# Patient Record
Sex: Male | Born: 1965 | Race: White | Hispanic: No | Marital: Single | Smoking: Current every day smoker
Health system: Southern US, Community
[De-identification: ages and names within clinical notes are randomized; demographics above are authoritative.]

## PROBLEM LIST (undated history)

## (undated) DIAGNOSIS — G8929 Other chronic pain: Secondary | ICD-10-CM

## (undated) DIAGNOSIS — A4902 Methicillin resistant Staphylococcus aureus infection, unspecified site: Secondary | ICD-10-CM

## (undated) DIAGNOSIS — L02212 Cutaneous abscess of back [any part, except buttock]: Secondary | ICD-10-CM

## (undated) DIAGNOSIS — M545 Low back pain: Secondary | ICD-10-CM

## (undated) DIAGNOSIS — I10 Essential (primary) hypertension: Secondary | ICD-10-CM

## (undated) DIAGNOSIS — Z720 Tobacco use: Secondary | ICD-10-CM

## (undated) DIAGNOSIS — J449 Chronic obstructive pulmonary disease, unspecified: Secondary | ICD-10-CM

## (undated) DIAGNOSIS — J9601 Acute respiratory failure with hypoxia: Secondary | ICD-10-CM

## (undated) DIAGNOSIS — E119 Type 2 diabetes mellitus without complications: Secondary | ICD-10-CM

## (undated) DIAGNOSIS — M549 Dorsalgia, unspecified: Secondary | ICD-10-CM

## (undated) DIAGNOSIS — R400 Somnolence: Secondary | ICD-10-CM

## (undated) DIAGNOSIS — J690 Pneumonitis due to inhalation of food and vomit: Secondary | ICD-10-CM

## (undated) DIAGNOSIS — M109 Gout, unspecified: Secondary | ICD-10-CM

## (undated) HISTORY — DX: Pneumonitis due to inhalation of food and vomit: J69.0

## (undated) HISTORY — PX: TONSILLECTOMY: SUR1361

## (undated) HISTORY — DX: Acute respiratory failure with hypoxia: J96.01

## (undated) HISTORY — PX: CARDIAC CATHETERIZATION: SHX172

## (undated) HISTORY — DX: Essential (primary) hypertension: I10

## (undated) HISTORY — DX: Somnolence: R40.0

## (undated) HISTORY — DX: Low back pain: M54.5

## (undated) HISTORY — DX: Cutaneous abscess of back (any part, except buttock): L02.212

---
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
## 1997-10-15 ENCOUNTER — Emergency Department (HOSPITAL_COMMUNITY): Admission: EM | Admit: 1997-10-15 | Discharge: 1997-10-15 | Payer: Self-pay | Admitting: Emergency Medicine

## 2000-11-24 ENCOUNTER — Emergency Department (HOSPITAL_COMMUNITY): Admission: EM | Admit: 2000-11-24 | Discharge: 2000-11-24 | Payer: Self-pay | Admitting: Emergency Medicine

## 2003-12-20 ENCOUNTER — Emergency Department (HOSPITAL_COMMUNITY): Admission: EM | Admit: 2003-12-20 | Discharge: 2003-12-20 | Payer: Self-pay | Admitting: Emergency Medicine

## 2005-01-01 ENCOUNTER — Emergency Department (HOSPITAL_COMMUNITY): Admission: EM | Admit: 2005-01-01 | Discharge: 2005-01-01 | Payer: Self-pay | Admitting: Emergency Medicine

## 2008-10-10 ENCOUNTER — Emergency Department (HOSPITAL_COMMUNITY): Admission: EM | Admit: 2008-10-10 | Discharge: 2008-10-10 | Payer: Self-pay | Admitting: Emergency Medicine

## 2010-01-18 ENCOUNTER — Emergency Department (HOSPITAL_COMMUNITY): Admission: EM | Admit: 2010-01-18 | Discharge: 2010-01-18 | Payer: Self-pay | Admitting: Emergency Medicine

## 2010-01-30 ENCOUNTER — Emergency Department (HOSPITAL_COMMUNITY): Admission: EM | Admit: 2010-01-30 | Discharge: 2010-01-30 | Payer: Self-pay | Admitting: Emergency Medicine

## 2011-07-28 DIAGNOSIS — M545 Low back pain, unspecified: Secondary | ICD-10-CM

## 2011-07-28 HISTORY — DX: Low back pain, unspecified: M54.50

## 2015-01-16 ENCOUNTER — Encounter (HOSPITAL_BASED_OUTPATIENT_CLINIC_OR_DEPARTMENT_OTHER): Payer: Self-pay | Admitting: *Deleted

## 2015-01-16 ENCOUNTER — Emergency Department (HOSPITAL_BASED_OUTPATIENT_CLINIC_OR_DEPARTMENT_OTHER)
Admission: EM | Admit: 2015-01-16 | Discharge: 2015-01-16 | Disposition: A | Discharge status: Home / Self Care | Attending: Emergency Medicine | Admitting: Emergency Medicine

## 2015-01-16 DIAGNOSIS — S61211A Laceration without foreign body of left index finger without damage to nail, initial encounter: Secondary | ICD-10-CM

## 2015-01-16 DIAGNOSIS — Z79899 Other long term (current) drug therapy: Secondary | ICD-10-CM | POA: Insufficient documentation

## 2015-01-16 DIAGNOSIS — G8929 Other chronic pain: Secondary | ICD-10-CM | POA: Insufficient documentation

## 2015-01-16 DIAGNOSIS — Z720 Tobacco use: Secondary | ICD-10-CM | POA: Insufficient documentation

## 2015-01-16 DIAGNOSIS — Y998 Other external cause status: Secondary | ICD-10-CM | POA: Insufficient documentation

## 2015-01-16 DIAGNOSIS — W260XXA Contact with knife, initial encounter: Secondary | ICD-10-CM | POA: Insufficient documentation

## 2015-01-16 DIAGNOSIS — Y9389 Activity, other specified: Secondary | ICD-10-CM | POA: Insufficient documentation

## 2015-01-16 DIAGNOSIS — Y9289 Other specified places as the place of occurrence of the external cause: Secondary | ICD-10-CM | POA: Insufficient documentation

## 2015-01-16 DIAGNOSIS — Z23 Encounter for immunization: Secondary | ICD-10-CM | POA: Insufficient documentation

## 2015-01-16 HISTORY — DX: Other chronic pain: G89.29

## 2015-01-16 HISTORY — DX: Dorsalgia, unspecified: M54.9

## 2015-01-16 MED ORDER — HYDROCODONE-ACETAMINOPHEN 5-325 MG PO TABS
2.0000 | ORAL_TABLET | Freq: Once | ORAL | Status: AC
  Administered 2015-01-16: 2 via ORAL
  Filled 2015-01-16: qty 2

## 2015-01-16 MED ORDER — TETANUS-DIPHTH-ACELL PERTUSSIS 5-2.5-18.5 LF-MCG/0.5 IM SUSP
0.5000 mL | Freq: Once | INTRAMUSCULAR | Status: AC
  Administered 2015-01-16: 0.5 mL via INTRAMUSCULAR
  Filled 2015-01-16: qty 0.5

## 2015-01-16 MED ORDER — LIDOCAINE HCL (PF) 2 % IJ SOLN
INTRAMUSCULAR | Status: AC
  Administered 2015-01-16: 5 mL
  Filled 2015-01-16: qty 2

## 2015-01-16 MED ORDER — HYDROCODONE-ACETAMINOPHEN 5-325 MG PO TABS: 2.0000 | ORAL_TABLET | ORAL | Status: DC | PRN

## 2015-01-16 MED ORDER — LIDOCAINE HCL 2 % IJ SOLN: 5.0000 mL | Freq: Once | INTRAMUSCULAR | Status: AC

## 2015-01-16 NOTE — Discharge Instructions (Signed)

## 2015-01-16 NOTE — ED Notes (Signed)
Dressing applied by Iona Beard EMT, site wnl

## 2015-01-16 NOTE — ED Notes (Signed)
Pt from [facility]- was cutting apple and cut left middle finger with knife- bandage not removed in triageOceanographer with pt

## 2015-01-16 NOTE — ED Provider Notes (Signed)
CSN: [id number]     Arrival date & time 01/16/15  1338 History   First MD Initiated Contact with Patient 01/16/15 1440     Chief Complaint  Patient presents with  . Extremity Laceration     (Consider location/radiation/quality/duration/timing/severity/associated sxs/prior Treatment) Patient is a 49 y.o. male presenting with skin laceration. The history is provided by the patient. No language interpreter was used.  Laceration Location:  Finger Finger laceration location:  L index finger Length (cm):  2 Depth:  Through underlying tissue Quality: straight   Bleeding: controlled   Time since incident:  1 hour Laceration mechanism:  Knife Pain details:    Quality:  Aching   Severity:  Moderate   Progression:  Worsening Foreign body present:  No foreign bodies Relieved by:  Nothing Worsened by:  Nothing tried Ineffective treatments:  None tried Pt ask for multiple medications, shots for pain, oxycontin, oxycodone, ativan.   Past Medical History  Diagnosis Date  . Chronic back pain    Past Surgical History  Procedure Laterality Date  . Back surgery     No family history on file. History  Substance Use Topics  . Smoking status: Current Every Day Smoker  . Smokeless tobacco: Never Used  . Alcohol Use: No    Review of Systems  Skin: Positive for wound.  All other systems reviewed and are negative.     Allergies  Review of patient's allergies indicates no known allergies.  Home Medications   Prior to Admission medications   Medication Sig Start Date End Date Taking? Authorizing Provider  LORazepam (ATIVAN) 1 MG tablet Take 1 mg by mouth every 8 (eight) hours.   Yes Historical Provider, MD  Oxycodone HCl 10 MG TABS Take 10 mg by mouth 4 (four) times daily.   Yes Historical Provider, MD  HYDROcodone-acetaminophen (NORCO/VICODIN) 5-325 MG per tablet Take 2 tablets by mouth every 4 (four) hours as needed. 01/16/15   Hollace Kinnier Sofia, PA-C   BP 138/97 mmHg  Pulse 70   Temp(Src) 98.6 F (37 C) (Oral)  Resp 18  Ht 5\' 6"  (1.676 m)  Wt 190 lb (86.183 kg)  BMI 30.68 kg/m2  SpO2 97% Physical Exam  Constitutional: He is oriented to person, place, and time. He appears well-developed and well-nourished.  HENT:  Head: Normocephalic.  Eyes: EOM are normal.  Neck: Normal range of motion.  Pulmonary/Chest: Effort normal.  Musculoskeletal: He exhibits tenderness.  2cm laceration finger from  nv and ns intact  Neurological: He is alert and oriented to person, place, and time.  Psychiatric: He has a normal mood and affect.  Nursing note and vitals reviewed.   ED Course  LACERATION REPAIR Date/Time: 01/16/2015 3:17 PM Performed by: Fransico Meadow Authorized by: Fransico Meadow Consent: Verbal consent obtained. Patient identity confirmed: verbally with patient Body area: upper extremity Laceration length: 2 cm Foreign bodies: no foreign bodies Tendon involvement: none Nerve involvement: none Anesthesia: local infiltration Local anesthetic: lidocaine 2% without epinephrine Preparation: Patient was prepped and draped in the usual sterile fashion. Irrigation solution: saline Debridement: moderate Skin closure: 5-0 Prolene Number of sutures: 4 Technique: simple Approximation: loose Approximation difficulty: simple Patient tolerance: Patient tolerated the procedure well with no immediate complications   (including critical care time) Labs Review Labs Reviewed - No data to display  Imaging Review No results found.   EKG Interpretation None      MDM   Final diagnoses:  Laceration of left index finger w/o foreign body  w/o damage to nail, initial encounter    Suture removal in 8 days Hydrocodone 10 tablets.    Hollace Kinnier [place], PA-C 01/16/15 1519  Dorie Rank, MD 01/16/15 [phone]

## 2015-10-30 DIAGNOSIS — R400 Somnolence: Secondary | ICD-10-CM

## 2015-10-30 DIAGNOSIS — J9601 Acute respiratory failure with hypoxia: Secondary | ICD-10-CM

## 2015-10-30 HISTORY — DX: Acute respiratory failure with hypoxia: J96.01

## 2015-10-30 HISTORY — DX: Somnolence: R40.0

## 2015-10-31 DIAGNOSIS — J690 Pneumonitis due to inhalation of food and vomit: Secondary | ICD-10-CM | POA: Insufficient documentation

## 2015-10-31 HISTORY — DX: Pneumonitis due to inhalation of food and vomit: J69.0

## 2016-03-12 ENCOUNTER — Emergency Department
Admission: EM | Admit: 2016-03-12 | Discharge: 2016-03-12 | Disposition: A | Discharge status: Home / Self Care | Payer: Self-pay | Attending: Emergency Medicine | Admitting: Emergency Medicine

## 2016-03-12 ENCOUNTER — Encounter: Payer: Self-pay | Admitting: Emergency Medicine

## 2016-03-12 ENCOUNTER — Emergency Department: Payer: Self-pay

## 2016-03-12 DIAGNOSIS — G8929 Other chronic pain: Secondary | ICD-10-CM | POA: Insufficient documentation

## 2016-03-12 DIAGNOSIS — F17200 Nicotine dependence, unspecified, uncomplicated: Secondary | ICD-10-CM | POA: Insufficient documentation

## 2016-03-12 DIAGNOSIS — M549 Dorsalgia, unspecified: Secondary | ICD-10-CM | POA: Insufficient documentation

## 2016-03-12 DIAGNOSIS — L03115 Cellulitis of right lower limb: Secondary | ICD-10-CM | POA: Insufficient documentation

## 2016-03-12 LAB — CBC WITH DIFFERENTIAL/PLATELET
BASOS ABS: 0.1 10*3/uL (ref 0–0.1)
Basophils Relative: 1 %
Eosinophils Absolute: 0.1 10*3/uL (ref 0–0.7)
Eosinophils Relative: 1 %
HEMATOCRIT: 47.4 % (ref 40.0–52.0)
HEMOGLOBIN: 16.4 g/dL (ref 13.0–18.0)
LYMPHS PCT: 27 %
Lymphs Abs: 3.3 10*3/uL (ref 1.0–3.6)
MCH: 32 pg (ref 26.0–34.0)
MCHC: 34.5 g/dL (ref 32.0–36.0)
MCV: 92.8 fL (ref 80.0–100.0)
MONO ABS: 0.7 10*3/uL (ref 0.2–1.0)
MONOS PCT: 6 %
NEUTROS ABS: 8.3 10*3/uL — AB (ref 1.4–6.5)
Neutrophils Relative %: 67 %
Platelets: 259 10*3/uL (ref 150–440)
RBC: 5.11 MIL/uL (ref 4.40–5.90)
RDW: 14.3 % (ref 11.5–14.5)
WBC: 12.5 10*3/uL — ABNORMAL HIGH (ref 3.8–10.6)

## 2016-03-12 LAB — BASIC METABOLIC PANEL
ANION GAP: 8 (ref 5–15)
BUN: 12 mg/dL (ref 6–20)
CALCIUM: 9.1 mg/dL (ref 8.9–10.3)
CHLORIDE: 104 mmol/L (ref 101–111)
CO2: 24 mmol/L (ref 22–32)
CREATININE: 0.72 mg/dL (ref 0.61–1.24)
GFR calc Af Amer: >60 mL/min (ref >60)
GLUCOSE: 141 mg/dL — AB (ref 65–99)
Potassium: 4 mmol/L (ref 3.5–5.1)
Sodium: 136 mmol/L (ref 135–145)

## 2016-03-12 MED ORDER — CLINDAMYCIN PHOSPHATE 600 MG/50ML IV SOLN
600.0000 mg | Freq: Once | INTRAVENOUS | Status: AC
  Administered 2016-03-12: 600 mg via INTRAVENOUS
  Filled 2016-03-12: qty 50

## 2016-03-12 MED ORDER — HYDROCODONE-ACETAMINOPHEN 5-325 MG PO TABS: 1.0000 | ORAL_TABLET | ORAL | 0 refills | Status: DC | PRN

## 2016-03-12 MED ORDER — CLINDAMYCIN HCL 150 MG PO CAPS: ORAL_CAPSULE | ORAL | 0 refills | Status: DC

## 2016-03-12 MED ORDER — HYDROMORPHONE HCL 1 MG/ML IJ SOLN
1.0000 mg | Freq: Once | INTRAMUSCULAR | Status: AC
  Administered 2016-03-12: 1 mg via INTRAVENOUS
  Filled 2016-03-12: qty 1

## 2016-03-12 NOTE — ED Provider Notes (Signed)
[facility] Emergency Department Provider Note   ____________________________________________   First MD Initiated Contact with Patient 03/12/16 1307     (approximate)  I have reviewed the triage vital signs and the nursing notes.   HISTORY  Chief Complaint Abscess   HPI Jeremy Long is a 50 y.o. male is here with complaint of right knee pain.Patient states that he began having a tender area approximate 5 days ago that has continued to become red and tender. Wife states that [facility] yesterday and this morning his knee is much worse. He now has some red areas extending towards his thigh. Patient states that he squeezed an area on his knee this morning and pus came out. Patient denies any fever or chills, no nausea or vomiting. Patient rates his pain as a 10 over 10 at this time.   Past Medical History:  Diagnosis Date  . Chronic back pain     There are no active problems to display for this patient.   Past Surgical History:  Procedure Laterality Date  . BACK SURGERY      Prior to Admission medications   Medication Sig Start Date End Date Taking? Authorizing Provider  clindamycin (CLEOCIN) 150 MG capsule Take 2 capsule qid x 7 days 03/12/16   Johnn Hai, PA-C  HYDROcodone-acetaminophen (NORCO/VICODIN) 5-325 MG tablet Take 1 tablet by mouth every 4 (four) hours as needed for moderate pain. 03/12/16   Johnn Hai, PA-C  LORazepam (ATIVAN) 1 MG tablet Take 1 mg by mouth every 8 (eight) hours.    Historical Provider, MD  Oxycodone HCl 10 MG TABS Take 10 mg by mouth 4 (four) times daily.    Historical Provider, MD    Allergies Review of patient's allergies indicates no known allergies.  No family history on file.  Social History Social History  Substance Use Topics  . Smoking status: Current Every Day Smoker  . Smokeless tobacco: Never Used  . Alcohol use No    Review of Systems Constitutional: No  fever/chills Cardiovascular: Denies chest pain. Respiratory: Denies shortness of breath. Gastrointestinal: No abdominal pain.  No nausea, no vomiting.   Musculoskeletal: Positive chronic back pain. Skin: Positive infection right knee. Neurological: Negative for headaches, focal weakness or numbness.  10-point ROS otherwise negative.  ____________________________________________   PHYSICAL EXAM:  VITAL SIGNS: ED Triage Vitals  Enc Vitals Group     BP 03/12/16 1217 132/85     Pulse Rate 03/12/16 1217 88     Resp 03/12/16 1217 16     Temp 03/12/16 1217 98.2 F (36.8 C)     Temp Source 03/12/16 1217 Oral     SpO2 03/12/16 1217 98 %     Weight 03/12/16 1218 190 lb (86.2 kg)     Height 03/12/16 1218 5\' 7"  (1.702 m)     Head Circumference --      Peak Flow --      Pain Score 03/12/16 1218 10     Pain Loc --      Pain Edu? --      Excl. in [place]? --     Constitutional: Alert and oriented. Well appearing and in no acute distress. Eyes: Conjunctivae are normal. PERRL. EOMI. Head: Atraumatic. Nose: No congestion/rhinnorhea. Neck: No stridor.   Cardiovascular: Normal rate, regular rhythm. Grossly normal heart sounds.  Good peripheral circulation. Respiratory: Normal respiratory effort.  No retractions. Lungs CTAB. Musculoskeletal: Moves upper and lower extremities without any  difficulty and patient has normal gait. Gait was noted multiple times when patient walked to the nurse's station unassisted. Neurologic:  Normal speech and language. No gross focal neurologic deficits are appreciated. No gait instability. Skin:  Skin is warm, erythematous anterior right knee. There is a pinpoint opening on the anterior knee without active drainage at this time. This is questionable for a foreign body. There is no fluctuance in this area. There is cellulitis extending approximately 6 cm superiorly to the knee and less than 4 cm distally. Psychiatric: Mood and affect are normal. Speech and behavior  are normal.  ____________________________________________   LABS (all labs ordered are listed, but only abnormal results are displayed)  Labs Reviewed  CBC WITH DIFFERENTIAL/PLATELET - Abnormal; Notable for the following:       Result Value   WBC 12.5 (*)    Neutro Abs 8.3 (*)    All other components within normal limits  BASIC METABOLIC PANEL - Abnormal; Notable for the following:    Glucose, Bld 141 (*)    All other components within normal limits    RADIOLOGY  X-ray right knee no foreign body is noted per radiologist. I, Johnn Hai, personally viewed and evaluated these images (plain radiographs) as part of my medical decision making, as well as reviewing the written report by the radiologist. ____________________________________________   PROCEDURES  Procedure(s) performed: None  Procedures  Critical Care performed: No  ____________________________________________   INITIAL IMPRESSION / ASSESSMENT AND PLAN / ED COURSE  Pertinent labs & imaging results that were available during my care of the patient were reviewed by me and considered in my medical decision making (see chart for details).    Clinical Course   Patient was frequently grabbing his leg and trying to force pus out of his leg. We discussed that this is probably making his leg hurt worse and is counterproductive. Patient was given IV clindamycin while in the emergency room along with Dilaudid 1 mg IV. Patient told nurse that Dilaudid did not make his pain go away. We discussed the pros and cons of I&D in his knee without a fluctuant area. Patient will begin antibiotics as suggested and use warm compresses frequently. Patient was also given a limited amount of Norco as needed for pain. Patient sees a doctor in [place] for back pain and was given a prescription for Percocet 10 mg #120 on 8/11. Patient states that he is not having any of this medication as he's been taking his pain medication more  frequently because of his infection to his knee. He is to follow-up with his doctor in [place] if any continued problems.  ____________________________________________   FINAL CLINICAL IMPRESSION(S) / ED DIAGNOSES  Final diagnoses:  Cellulitis of right knee      NEW MEDICATIONS STARTED DURING THIS VISIT:  Discharge Medication List as of 03/12/2016  3:03 PM    START taking these medications   Details  clindamycin (CLEOCIN) 150 MG capsule Take 2 capsule qid x 7 days, Print         Note:  This document was prepared using Dragon voice recognition software and may include unintentional dictation errors.    Johnn Hai, PA-C 03/12/16 1848    Delman Kitten, MD 03/18/16 1534

## 2016-03-12 NOTE — ED Notes (Signed)
Pt refused to have B retaken for d/c vitals

## 2016-03-12 NOTE — Discharge Instructions (Signed)
Follow-up with Dr. Marlou Sa in [place] if any continued problems. Warm moist compresses to your knee frequently today. Take medication only as prescribed.

## 2016-03-12 NOTE — ED Triage Notes (Signed)
Right knee abscess x 5 days.  Area tender, hot to touch, pain radiating upward towards thigh and downwards toward calf.

## 2016-05-06 ENCOUNTER — Emergency Department: Payer: Self-pay

## 2016-05-06 ENCOUNTER — Emergency Department
Admission: EM | Admit: 2016-05-06 | Discharge: 2016-05-06 | Disposition: A | Discharge status: Home / Self Care | Payer: Self-pay | Attending: Emergency Medicine | Admitting: Emergency Medicine

## 2016-05-06 DIAGNOSIS — F17200 Nicotine dependence, unspecified, uncomplicated: Secondary | ICD-10-CM | POA: Insufficient documentation

## 2016-05-06 DIAGNOSIS — Z79899 Other long term (current) drug therapy: Secondary | ICD-10-CM | POA: Insufficient documentation

## 2016-05-06 DIAGNOSIS — Y999 Unspecified external cause status: Secondary | ICD-10-CM | POA: Insufficient documentation

## 2016-05-06 DIAGNOSIS — S93402A Sprain of unspecified ligament of left ankle, initial encounter: Secondary | ICD-10-CM | POA: Insufficient documentation

## 2016-05-06 DIAGNOSIS — W1842XA Slipping, tripping and stumbling without falling due to stepping into hole or opening, initial encounter: Secondary | ICD-10-CM | POA: Insufficient documentation

## 2016-05-06 DIAGNOSIS — Y939 Activity, unspecified: Secondary | ICD-10-CM | POA: Insufficient documentation

## 2016-05-06 DIAGNOSIS — S93602A Unspecified sprain of left foot, initial encounter: Secondary | ICD-10-CM | POA: Insufficient documentation

## 2016-05-06 DIAGNOSIS — Y929 Unspecified place or not applicable: Secondary | ICD-10-CM | POA: Insufficient documentation

## 2016-05-06 MED ORDER — OXYCODONE-ACETAMINOPHEN 5-325 MG PO TABS: 1.0000 | ORAL_TABLET | ORAL | 0 refills | Status: DC | PRN

## 2016-05-06 MED ORDER — OXYCODONE-ACETAMINOPHEN 5-325 MG PO TABS
1.0000 | ORAL_TABLET | Freq: Once | ORAL | Status: AC
  Administered 2016-05-06: 1 via ORAL
  Filled 2016-05-06: qty 1

## 2016-05-06 NOTE — ED Provider Notes (Signed)
[facility] Emergency Department Provider Note    First MD Initiated Contact with Patient 05/06/16 [phone]     (approximate)  I have reviewed the triage vital signs and the nursing notes.   HISTORY  Chief Complaint Foot Pain   HPI Jeremy Long is a 50 y.o. male with history of chronic back pain presents with history of stepping in a hole 2 days ago injuring his left foot. Patient states current pain score is 9 out of 10 and swelling since the event and has progressively worsened.   Past Medical History:  Diagnosis Date  . Chronic back pain     There are no active problems to display for this patient.   Past Surgical History:  Procedure Laterality Date  . BACK SURGERY      Prior to Admission medications   Medication Sig Start Date End Date Taking? Authorizing Provider  clindamycin (CLEOCIN) 150 MG capsule Take 2 capsule qid x 7 days 03/12/16   Johnn Hai, PA-C  HYDROcodone-acetaminophen (NORCO/VICODIN) 5-325 MG tablet Take 1 tablet by mouth every 4 (four) hours as needed for moderate pain. 03/12/16   Johnn Hai, PA-C  LORazepam (ATIVAN) 1 MG tablet Take 1 mg by mouth every 8 (eight) hours.    Historical Provider, MD  Oxycodone HCl 10 MG TABS Take 10 mg by mouth 4 (four) times daily.    Historical Provider, MD  oxyCODONE-acetaminophen (ROXICET) 5-325 MG tablet Take 1 tablet by mouth every 4 (four) hours as needed for severe pain. 05/06/16   Gregor Hams, MD    Allergies No known drug allergies No family history on file.  Social History Social History  Substance Use Topics  . Smoking status: Current Every Day Smoker  . Smokeless tobacco: Never Used  . Alcohol use No    Review of Systems Constitutional: No fever/chills Eyes: No visual changes. ENT: No sore throat. Cardiovascular: Denies chest pain. Respiratory: Denies shortness of breath. Gastrointestinal: No abdominal pain.  No nausea, no vomiting.  No diarrhea.  No  constipation. Genitourinary: Negative for dysuria. Musculoskeletal: Negative for back pain.Positive for right foot pain Skin: Negative for rash. Neurological: Negative for headaches, focal weakness or numbness.  10-point ROS otherwise negative.  ____________________________________________   PHYSICAL EXAM:  VITAL SIGNS: ED Triage Vitals  Enc Vitals Group     BP 05/06/16 0525 (!) 135/99     Pulse Rate 05/06/16 0525 93     Resp 05/06/16 0525 16     Temp 05/06/16 0525 98.2 F (36.8 C)     Temp Source 05/06/16 0525 Oral     SpO2 05/06/16 0525 99 %     Weight 05/06/16 0522 190 lb (86.2 kg)     Height 05/06/16 0522 5\' 6"  (1.676 m)     Head Circumference --      Peak Flow --      Pain Score 05/06/16 0523 10     Pain Loc --      Pain Edu? --      Excl. in [place]? --     Constitutional: Alert and oriented. Well appearing and in no acute distress. Eyes: Conjunctivae are normal. PERRL. EOMI. Head: Atraumatic. Nose: No congestion/rhinnorhea. Mouth/Throat: Mucous membranes are moist.  Oropharynx non-erythematous. Neck: No stridor. Musculoskeletal: Neurologic:  Normal speech and language. No gross focal neurologic deficits are appreciated.  Skin:  Skin is warm, dry and intact. No rash noted. Psychiatric: Mood and affect are normal. Speech and behavior are normal.  RADIOLOGY I, [place], personally viewed and evaluated these images (plain radiographs) as part of my medical decision making, as well as reviewing the written report by the radiologist.  Dg Foot Complete Left  Result Date: 05/06/2016 CLINICAL DATA:  50 year old male with fall and left hip pain. EXAM: LEFT FOOT - COMPLETE 3+ VIEW COMPARISON:  None. FINDINGS: There is no acute fracture or dislocation. There is mild erosive changes of the medial head of the first metatarsal. Correlation with history of gout recommended. There is mild soft tissue prominence over the head of the first metatarsal. The remainder of the  soft tissues appear unremarkable. IMPRESSION: No acute fracture or dislocation. Erosive changes of the medial aspect of the head of the first metatarsal likely related to gout. Clinical correlation is recommended. Electronically Signed   By: Anner Crete M.D.   On: 05/06/2016 06:00    ____________________________________________   Procedures     INITIAL IMPRESSION / ASSESSMENT AND PLAN / ED COURSE  Pertinent labs & imaging results that were available during my care of the patient were reviewed by me and considered in my medical decision making (see chart for details).     Clinical Course    ____________________________________________  FINAL CLINICAL IMPRESSION(S) / ED DIAGNOSES  Final diagnoses:  Sprain of left foot, initial encounter  Sprain of left ankle, unspecified ligament, initial encounter     MEDICATIONS GIVEN DURING THIS VISIT:  Medications  oxyCODONE-acetaminophen (PERCOCET/ROXICET) 5-325 MG per tablet 1 tablet (1 tablet Oral Given 05/06/16 0629)     NEW OUTPATIENT MEDICATIONS STARTED DURING THIS VISIT:  New Prescriptions   OXYCODONE-ACETAMINOPHEN (ROXICET) 5-325 MG TABLET    Take 1 tablet by mouth every 4 (four) hours as needed for severe pain.    Modified Medications   No medications on file    Discontinued Medications   No medications on file     Note:  This document was prepared using Dragon voice recognition software and may include unintentional dictation errors.    Gregor Hams, MD 05/06/16 [phone]
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
## 2016-06-28 ENCOUNTER — Emergency Department
Admission: EM | Admit: 2016-06-28 | Discharge: 2016-06-28 | Disposition: A | Discharge status: Home / Self Care | Payer: No Typology Code available for payment source | Attending: Emergency Medicine | Admitting: Emergency Medicine

## 2016-06-28 ENCOUNTER — Encounter: Payer: Self-pay | Admitting: *Deleted

## 2016-06-28 DIAGNOSIS — F17200 Nicotine dependence, unspecified, uncomplicated: Secondary | ICD-10-CM | POA: Insufficient documentation

## 2016-06-28 DIAGNOSIS — Y92410 Unspecified street and highway as the place of occurrence of the external cause: Secondary | ICD-10-CM | POA: Insufficient documentation

## 2016-06-28 DIAGNOSIS — S199XXA Unspecified injury of neck, initial encounter: Secondary | ICD-10-CM | POA: Diagnosis present

## 2016-06-28 DIAGNOSIS — Y939 Activity, unspecified: Secondary | ICD-10-CM | POA: Insufficient documentation

## 2016-06-28 DIAGNOSIS — Z79899 Other long term (current) drug therapy: Secondary | ICD-10-CM | POA: Insufficient documentation

## 2016-06-28 DIAGNOSIS — Y999 Unspecified external cause status: Secondary | ICD-10-CM | POA: Diagnosis not present

## 2016-06-28 DIAGNOSIS — S161XXA Strain of muscle, fascia and tendon at neck level, initial encounter: Secondary | ICD-10-CM | POA: Insufficient documentation

## 2016-06-28 MED ORDER — MELOXICAM 15 MG PO TABS: 15.0000 mg | ORAL_TABLET | Freq: Every day | ORAL | 0 refills | Status: DC

## 2016-06-28 MED ORDER — ORPHENADRINE CITRATE 30 MG/ML IJ SOLN
60.0000 mg | Freq: Once | INTRAMUSCULAR | Status: AC
  Administered 2016-06-28: 60 mg via INTRAMUSCULAR
  Filled 2016-06-28: qty 2

## 2016-06-28 MED ORDER — CYCLOBENZAPRINE HCL 10 MG PO TABS: 10.0000 mg | ORAL_TABLET | Freq: Three times a day (TID) | ORAL | 0 refills | Status: DC | PRN

## 2016-06-28 MED ORDER — OXYCODONE-ACETAMINOPHEN 5-325 MG PO TABS
1.0000 | ORAL_TABLET | Freq: Once | ORAL | Status: AC
Start: 2016-06-28 — End: 2016-06-28
  Administered 2016-06-28: 1 via ORAL
  Filled 2016-06-28: qty 1
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
## 2016-10-16 ENCOUNTER — Other Ambulatory Visit
Admission: AD | Admit: 2016-10-16 | Discharge: 2016-10-16 | Disposition: A | Discharge status: Home / Self Care | Payer: Self-pay | Attending: Family Medicine | Admitting: Family Medicine
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
## 2016-10-16 NOTE — ED Notes (Signed)
Blood drawn from left [facility], handed to officer Darden

## 2017-01-18 ENCOUNTER — Emergency Department: Payer: Self-pay

## 2017-01-18 ENCOUNTER — Emergency Department
Admission: EM | Admit: 2017-01-18 | Discharge: 2017-01-18 | Disposition: A | Discharge status: Home / Self Care | Payer: Self-pay | Attending: Emergency Medicine | Admitting: Emergency Medicine

## 2017-01-18 ENCOUNTER — Encounter: Payer: Self-pay | Admitting: Emergency Medicine

## 2017-01-18 DIAGNOSIS — K209 Esophagitis, unspecified without bleeding: Secondary | ICD-10-CM

## 2017-01-18 DIAGNOSIS — Z79891 Long term (current) use of opiate analgesic: Secondary | ICD-10-CM | POA: Insufficient documentation

## 2017-01-18 DIAGNOSIS — F17210 Nicotine dependence, cigarettes, uncomplicated: Secondary | ICD-10-CM | POA: Insufficient documentation

## 2017-01-18 DIAGNOSIS — J9801 Acute bronchospasm: Secondary | ICD-10-CM | POA: Insufficient documentation

## 2017-01-18 DIAGNOSIS — K2100 Gastro-esophageal reflux disease with esophagitis, without bleeding: Secondary | ICD-10-CM

## 2017-01-18 DIAGNOSIS — K219 Gastro-esophageal reflux disease without esophagitis: Secondary | ICD-10-CM | POA: Insufficient documentation

## 2017-01-18 LAB — CBC
HEMATOCRIT: 48.5 % (ref 40.0–52.0)
Hemoglobin: 16.7 g/dL (ref 13.0–18.0)
MCH: 31.8 pg (ref 26.0–34.0)
MCHC: 34.5 g/dL (ref 32.0–36.0)
MCV: 92.2 fL (ref 80.0–100.0)
PLATELETS: 255 10*3/uL (ref 150–440)
RBC: 5.26 MIL/uL (ref 4.40–5.90)
RDW: 13.4 % (ref 11.5–14.5)
WBC: 8.4 10*3/uL (ref 3.8–10.6)

## 2017-01-18 LAB — BASIC METABOLIC PANEL
Anion gap: 8 (ref 5–15)
BUN: 29 mg/dL — ABNORMAL HIGH (ref 6–20)
CHLORIDE: 105 mmol/L (ref 101–111)
CO2: 21 mmol/L — ABNORMAL LOW (ref 22–32)
CREATININE: 0.95 mg/dL (ref 0.61–1.24)
Calcium: 9.4 mg/dL (ref 8.9–10.3)
GFR calc non Af Amer: >60 mL/min (ref >60)
Glucose, Bld: 102 mg/dL — ABNORMAL HIGH (ref 65–99)
POTASSIUM: 4.7 mmol/L (ref 3.5–5.1)
SODIUM: 134 mmol/L — AB (ref 135–145)

## 2017-01-18 LAB — TROPONIN I: Troponin I: <0.03 ng/mL (ref <0.03)

## 2017-01-18 MED ORDER — ALBUTEROL SULFATE HFA 108 (90 BASE) MCG/ACT IN AERS: 2.0000 | INHALATION_SPRAY | Freq: Four times a day (QID) | RESPIRATORY_TRACT | 0 refills | Status: DC | PRN

## 2017-01-18 MED ORDER — IPRATROPIUM-ALBUTEROL 0.5-2.5 (3) MG/3ML IN SOLN
3.0000 mL | Freq: Once | RESPIRATORY_TRACT | Status: AC
  Administered 2017-01-18: 3 mL via RESPIRATORY_TRACT
  Filled 2017-01-18: qty 3

## 2017-01-18 MED ORDER — LIDOCAINE VISCOUS 2 % MT SOLN: 15.0000 mL | OROMUCOSAL | 0 refills | Status: DC | PRN

## 2017-01-18 MED ORDER — LIDOCAINE VISCOUS 2 % MT SOLN
15.0000 mL | Freq: Once | OROMUCOSAL | Status: AC
  Administered 2017-01-18: 15 mL via OROMUCOSAL
  Filled 2017-01-18: qty 15

## 2017-01-18 NOTE — ED Notes (Signed)
PAtient refused to let this RN get d/c vital signs

## 2017-01-18 NOTE — ED Provider Notes (Signed)
[facility] Emergency Department Provider Note ____________________________________________   I have reviewed the triage vital signs and the triage nursing note.  HISTORY  Chief Complaint Shortness of Breath   Historian Patient  HPI Jeremy Long is a 51 y.o. male with a history of chronic back pain, presents with several weeks of dyspnea. He does have a history of 2 packs per day for many years smoking history. States that over the past couple weeks he's had dyspnea on exertion. No chest pain. He over the last 24 hours has developed severe pain with swallowing. No significant abdominal pain or chest pain. Denies fevers. States that he has a mild dry cough. Thinks that he may have some wheezing and had been prescribed Advair inhaler, but was unable to fill it due to cost. He is not sure if he is tried albuterol or not.  No leg swelling or leg pain. No skin rash.  Shortness of breath is mild at rest and moderate with walking. Sore throat/swallowing pain is moderate at present.    Past Medical History:  Diagnosis Date  . Chronic back pain     There are no active problems to display for this patient.   Past Surgical History:  Procedure Laterality Date  . BACK SURGERY      Prior to Admission medications   Medication Sig Start Date End Date Taking? Authorizing Provider  ALPRAZolam Duanne Moron) 1 MG tablet Take 1 mg by mouth 3 (three) times daily as needed for anxiety. 12/27/16  Yes [provider]  Oxycodone HCl 10 MG TABS Take 10 mg by mouth 4 (four) times daily.   Yes [provider]  clindamycin (CLEOCIN) 150 MG capsule Take 2 capsule qid x 7 days Patient not taking: Reported on 01/18/2017 03/12/16   Johnn Hai, PA-C  cyclobenzaprine (FLEXERIL) 10 MG tablet Take 1 tablet (10 mg total) by mouth 3 (three) times daily as needed for muscle spasms. Patient not taking: Reported on 01/18/2017 06/28/16   Cuthriell, Charline Bills, PA-C   HYDROcodone-acetaminophen (NORCO/VICODIN) 5-325 MG tablet Take 1 tablet by mouth every 4 (four) hours as needed for moderate pain. Patient not taking: Reported on 01/18/2017 03/12/16   Johnn Hai, PA-C  meloxicam (MOBIC) 15 MG tablet Take 1 tablet (15 mg total) by mouth daily. Patient not taking: Reported on 01/18/2017 06/28/16   Cuthriell, Charline Bills, PA-C  oxyCODONE-acetaminophen (ROXICET) 5-325 MG tablet Take 1 tablet by mouth every 4 (four) hours as needed for severe pain. Patient not taking: Reported on 01/18/2017 05/06/16   Gregor Hams, MD    No Known Allergies  No family history on file.  Social History Social History  Substance Use Topics  . Smoking status: Current Every Day Smoker    Packs/day: 0.50    Years: 25.00    Types: Cigarettes  . Smokeless tobacco: Never Used  . Alcohol use No    Review of Systems  Constitutional: Negative for fever. Eyes: Negative for visual changes. ENT: Positive for sore throat. Cardiovascular: Negative for chest pain. Respiratory: Positive for shortness of breath. Gastrointestinal: Negative for abdominal pain, vomiting and diarrhea. Genitourinary: Negative for dysuria. Musculoskeletal: Positive for chronic low back pain. Skin: Negative for rash. Neurological: Negative for headache.  ____________________________________________   PHYSICAL EXAM:  VITAL SIGNS: ED Triage Vitals  Enc Vitals Group     BP 01/18/17 1028 (!) 153/75     Pulse Rate 01/18/17 1028 71     Resp 01/18/17 1028 18  Temp 01/18/17 1028 99.1 F (37.3 C)     Temp Source 01/18/17 1028 Oral     SpO2 01/18/17 1028 95 %     Weight 01/18/17 1029 200 lb (90.7 kg)     Height 01/18/17 1029 5\' 6"  (1.676 m)     Head Circumference --      Peak Flow --      Pain Score 01/18/17 1028 10     Pain Loc --      Pain Edu? --      Excl. in [place]? --      Constitutional: Alert and oriented. Well appearing and in no distress. HEENT   Head: Normocephalic and  atraumatic.      Eyes: Conjunctivae are normal. Pupils equal and round.       Ears:         Nose: No congestion/rhinnorhea.   Mouth/Throat: Mucous membranes are moist.   Neck: No stridor.  Normal and appearance. No pharyngeal erythema visualized. Cardiovascular/Chest: Normal rate, regular rhythm.  No murmurs, rubs, or gallops. Respiratory: Normal respiratory effort without tachypnea nor retractions. Breath sounds are clear and equal bilaterally. Mild type breath sounds without obvious wheezing, rales or rhonchi. Gastrointestinal: Soft. No distention, no guarding, no rebound. Nontender.   Genitourinary/rectal:Deferred Musculoskeletal: Nontender with normal range of motion in all extremities. No joint effusions.  No lower extremity tenderness.  No edema. Neurologic:  Normal speech and language. No gross or focal neurologic deficits are appreciated. Skin:  Skin is warm, dry and intact. No rash noted. Psychiatric: Mood and affect are normal. Speech and behavior are normal. Patient exhibits appropriate insight and judgment.   ____________________________________________  LABS (pertinent positives/negatives)  Labs Reviewed  BASIC METABOLIC PANEL - Abnormal; Notable for the following:       Result Value   Sodium 134 (*)    CO2 21 (*)    Glucose, Bld 102 (*)    BUN 29 (*)    All other components within normal limits  CBC  TROPONIN I    ____________________________________________    EKG I, Lisa Roca, MD, the attending physician have personally viewed and interpreted all ECGs.  61 bpm. Normal sinus rhythm. Narrow QRS. Normal axis. Normal ST and T-wave ____________________________________________  RADIOLOGY All Xrays were viewed by me. Imaging interpreted by Radiologist.  Chest x-ray two-view: IMPRESSION: No acute disease  Soft tissue neck:  IMPRESSION: Negative. __________________________________________  PROCEDURES  Procedure(s) performed: None  Critical Care  performed: None  ____________________________________________   ED COURSE / ASSESSMENT AND PLAN  Pertinent labs & imaging results that were available during my care of the patient were reviewed by me and considered in my medical decision making (see chart for details).   Mr. Schaller is here for a more chronic complaint of several months of dyspnea on exertion. He is a smoker. No significant chest pain, nausea, or extension of discomfort. The last couple days she's had some sore throat and overnight he felt like his throat was on fire. Oropharynx that I can visualize myself is normal in appearance.  I am somewhat suspicious of possible GERD causing an esophagitis.  EKG is normal. Chest x-ray is normal. Soft tissue of the neck is normal.  He does have some tight breath sounds without obvious wheezing, did try an albuterol treatment patient reports some mild improvement with that. No pleuritic chest pain. Not concerned this point for acute cardiac etiology or PE. I think this is probably bronchospasm related to smoking. I am going to  write prescription for albuterol. I also provided him social work resources for [employer] and prescription information.  Patient states that he is without insurance and has applied for financial assistance at [facility]. I think this is likely his best option although I did provide him follow-up information with regard to other community resources including [facility] and [facility]. I think he is probably going to potentially need to see an ENT physician for a scope if he has persistent throat discomfort.  He is able to swallow, I don't think he has an acute obstruction, and I don't think he warrants any additional emergency department treatment. We did discuss return precautions.  He had essentially complete resolution of discomfort with viscous lidocaine. I will prescribe viscous lidocaine, although we discussed not to take more than prescribed  with respect to cardiac side effects.   As patient was getting ready to leave he wanted to show me a spot on his mid back which had apparently been oozing over the past several days, was finally starting to look better. He has an area that looks like it is a healing abscess, no redness or drainage or induration at this point. It is directly overlying the midline scar. He does not state that the back pain is really any worse than his chronic back pain. He thinks he could've had fevers, but is afebrile here. He does not have any neurologic symptoms. At this point it looks to me like most likely a superficial abscess that had drained and is now healed/healing. At this point I don't see anything acutely more concerned about complications such as tracking deep to the spine. We did discuss return precautions with respect to that.  CONSULTATIONS:  None  Patient / Family / Caregiver informed of clinical course, medical decision-making process, and agree with plan.   I discussed return precautions, follow-up instructions, and discharge instructions with patient and/or family.  Discharge Instructions : You were evaluated for pain when swallowing, and as we discussed, your exam and evaluation are overall reassuring in the emergency department today.  From the standpoint of the pain with swallowing, as we discussed the most likely cause is acid reflux/esophagitis. You may take over-the-counter Tums or Maalox for immediate relief. You may try over-the-counter Prilosec 40 mg daily for 1-2 weeks to reduce acid while the lining is healing.  If you have continued symptoms, you should see an ENT physician for possible scope to visualize the area.  Return to the emergency room immediately for any new or worsening trouble swallowing, throwing up blood, black or bloody stools, trouble breathing, or any other symptoms concerning to you.  In terms of your shortness of breath, we discussed continued to try to quit  smoking. Your exam and evaluation are overall reassuring in the emergency department today.  You may need some additional evaluation with primary care doctor, for underlying lung disease. Return to the emergency department immediately for any fever, new or worsening trouble breathing, any chest pain or nausea or sweats, weakness, dizziness, passing out, or any other symptoms concerning to you.  With respect to your back, the spots that were reportedly oozing to be healing at this point in time.  Return to the emergency room immediately for any new or worsening back pain, weakness, numbness, fever, nausea, sweats, skin redness, or skin drainage.  ___________________________________________   FINAL CLINICAL IMPRESSION(S) / ED DIAGNOSES   Final diagnoses:  Esophagitis  Gastroesophageal reflux disease with esophagitis  Bronchospasm, acute  Note: This dictation was prepared with Dragon dictation. Any transcriptional errors that result from this process are unintentional    Lisa Roca, MD 01/18/17 1349

## 2017-01-18 NOTE — ED Triage Notes (Signed)
Patient presents to the ED with shortness of breath x 1 week.  Patient reports chest pain with deep breathing and patient states he feels that his throat, "is on fire."  "I feel like my vocal cords are burnt".  Patient's voice is slightly hoarse.  Patient's breath sounds are slightly diminished.  Patient is very talkative in triage without difficulty.

## 2017-01-18 NOTE — ED Triage Notes (Signed)
FIRST NURSE NOTE-pt presents for [facility]. Ambulatory to check in desk. Unlabored at this time.  No respiratory distress.

## 2017-01-18 NOTE — ED Notes (Signed)
Pt states that he has been having shortness of breath and that his throat feels like it is on fire. Pt states that the problems with his throat started 2-3 days ago.   Pt was ambulatory to exam room and is able to talk in complete sentences, does not appear to be in any distress at this time, O2 sats WNL.

## 2017-01-18 NOTE — Discharge Instructions (Signed)
You were evaluated for pain when swallowing, and as we discussed, your exam and evaluation are overall reassuring in the emergency department today.  From the standpoint of the pain with swallowing, as we discussed the most likely cause is acid reflux/esophagitis. You may take over-the-counter Tums or Maalox for immediate relief. You may try over-the-counter Prilosec 40 mg daily for 1-2 weeks to reduce acid while the lining is healing.  If you have continued symptoms, you should see an ENT physician for possible scope to visualize the area.  Return to the emergency room immediately for any new or worsening trouble swallowing, throwing up blood, black or bloody stools, trouble breathing, or any other symptoms concerning to you.  In terms of your shortness of breath, we discussed continued to try to quit smoking. Your exam and evaluation are overall reassuring in the emergency department today.  You may need some additional evaluation with primary care doctor, for underlying lung disease. Return to the emergency department immediately for any fever, new or worsening trouble breathing, any chest pain or nausea or sweats, weakness, dizziness, passing out, or any other symptoms concerning to you.  With respect to your back, the spots that were reportedly oozing to be healing at this point in time.  Return to the emergency room immediately for any new or worsening back pain, weakness, numbness, fever, nausea, sweats, skin redness, or skin drainage.

## 2017-04-23 ENCOUNTER — Emergency Department
Admission: EM | Admit: 2017-04-23 | Discharge: 2017-04-23 | Disposition: A | Discharge status: Home / Self Care | Payer: Self-pay | Attending: Emergency Medicine | Admitting: Emergency Medicine

## 2017-04-23 ENCOUNTER — Encounter: Payer: Self-pay | Admitting: Emergency Medicine

## 2017-04-23 DIAGNOSIS — F17210 Nicotine dependence, cigarettes, uncomplicated: Secondary | ICD-10-CM | POA: Insufficient documentation

## 2017-04-23 DIAGNOSIS — G8929 Other chronic pain: Secondary | ICD-10-CM | POA: Insufficient documentation

## 2017-04-23 DIAGNOSIS — L0291 Cutaneous abscess, unspecified: Secondary | ICD-10-CM

## 2017-04-23 DIAGNOSIS — L0211 Cutaneous abscess of neck: Secondary | ICD-10-CM | POA: Insufficient documentation

## 2017-04-23 MED ORDER — HYDROMORPHONE HCL 1 MG/ML IJ SOLN
1.0000 mg | Freq: Once | INTRAMUSCULAR | Status: AC
  Administered 2017-04-23: 1 mg via INTRAMUSCULAR
  Filled 2017-04-23: qty 1

## 2017-04-23 MED ORDER — MUPIROCIN 2 % EX OINT
TOPICAL_OINTMENT | CUTANEOUS | 0 refills | Status: DC
Start: 2017-04-23 — End: 2017-04-29

## 2017-04-23 MED ORDER — SULFAMETHOXAZOLE-TRIMETHOPRIM 800-160 MG PO TABS: 1.0000 | ORAL_TABLET | Freq: Two times a day (BID) | ORAL | 0 refills | Status: DC

## 2017-04-23 MED ORDER — LIDOCAINE-EPINEPHRINE 1 %-1:100000 IJ SOLN
10.0000 mL | Freq: Once | INTRAMUSCULAR | Status: AC
Start: 2017-04-23 — End: 2017-04-23
  Administered 2017-04-23: 10 mL via INTRADERMAL
  Filled 2017-04-23: qty 10

## 2017-04-23 NOTE — ED Notes (Addendum)
Patient states he had an abscess on his back about three months ago, took antibiotics for it ( a mycin type is all they can remember), finished the course and had a left over refill. He took the refill and used this prescription for this current abscess.

## 2017-04-23 NOTE — ED Provider Notes (Signed)
[facility] Emergency Department Provider Note       Time seen: ----------------------------------------- 11:29 AM on 04/23/2017 -----------------------------------------     I have reviewed the triage vital signs and the nursing notes.   HISTORY   Chief Complaint Abscess    HPI Jeremy Long is a 51 y.o. male with a history of chronic back pain who presents to the ED for an abscess on the back of his neck. Patient said abscess for the last 8 days. Patient was seen at an outside hospital for same and was given antibiotics but he reports this is not improved at all. They suggested that the abscess be incised and drained but this was not performed at. He has not had this happen before. It is very tender to touch.pain is 10 out of 10 on his neck.  Past Medical History:  Diagnosis Date  . Chronic back pain     There are no active problems to display for this patient.   Past Surgical History:  Procedure Laterality Date  . BACK SURGERY      Allergies Patient has no known allergies.  Social History Social History  Substance Use Topics  . Smoking status: Current Every Day Smoker    Packs/day: 0.50    Years: 25.00    Types: Cigarettes  . Smokeless tobacco: Never Used  . Alcohol use No    Review of Systems Constitutional: Negative for fever. Genitourinary: Negative for dysuria. Musculoskeletal:positive for neck pain Skin: positive for skin abscess Neurological: Negative for headaches, focal weakness or numbness.  All systems negative/normal/unremarkable except as stated in the HPI  ____________________________________________   PHYSICAL EXAM:  VITAL SIGNS: ED Triage Vitals  Enc Vitals Group     BP 04/23/17 1019 (!) 149/87     Pulse Rate 04/23/17 1019 71     Resp 04/23/17 1019 20     Temp 04/23/17 1019 98.1 F (36.7 C)     Temp Source 04/23/17 1019 Oral     SpO2 04/23/17 1019 98 %     Weight 04/23/17 1020 200 lb (90.7 kg)   Height --      Head Circumference --      Peak Flow --      Pain Score 04/23/17 1019 10     Pain Loc --      Pain Edu? --      Excl. in [place]? --     Constitutional: Alert and oriented. Well appearing and in no distress. Eyes: Conjunctivae are normal. Normal extraocular movements. ENT   Head: Normocephalic and atraumatic.   Nose: No congestion/rhinnorhea.   Mouth/Throat: Mucous membranes are moist.   Neck:there is an indurated and erythematous lesion approximately 6 cm between his neck and occiput. Area is tender to touch Musculoskeletal: skin abscess, otherwise negative Neurologic:  Normal speech and language. No gross focal neurologic deficits are appreciated.  Skin: skin abscess with induration and erythema as noted above on his occiput ____________________________________________  ED COURSE:  Pertinent labs & imaging results that were available during my care of the patient were reviewed by me and considered in my medical decision making (see chart for details). Patient presents for abscess, he will need incision and drainage.   Marland Kitchen.Incision and Drainage Date/Time: 04/23/2017 12:02 PM Performed by: Earleen Newport Authorized by: Lenise Arena E   Consent:    Consent obtained:  Verbal   Consent given by:  Patient Location:    Type:  Abscess   Location:  Neck Pre-procedure  details:    Skin preparation:  Betadine Anesthesia (see MAR for exact dosages):    Anesthesia method:  Local infiltration   Local anesthetic:  Lidocaine 1% WITH epi Procedure type:    Complexity:  Complex Procedure details:    Incision types:  Single straight   Incision depth:  Subcutaneous   Scalpel blade:  11   Wound management:  Probed and deloculated and irrigated with saline   Drainage:  Purulent   Drainage amount:  Scant   Wound treatment:  Drain placed   Packing materials:  1/4 in gauze Post-procedure details:    Patient tolerance of procedure:  Tolerated well, no  immediate complications   ____________________________________________  DIFFERENTIAL DIAGNOSIS   abscess, cellulitis, foreign body, skin cancer   FINAL ASSESSMENT AND PLAN  abscess   Plan: Patient had presented for skin abscess and has undergone incision and drainage. he'll be discharged with antibiotics and topical antibiotic ointment. Because he is in a pain clinic and under contract I cannot prescribe him pain medicine.   Earleen Newport, MD   Note: This note was generated in part or whole with voice recognition software. Voice recognition is usually quite accurate but there are transcription errors that can and very often do occur. I apologize for any typographical errors that were not detected and corrected.     Earleen Newport, MD 04/23/17 [phone]

## 2017-04-23 NOTE — ED Triage Notes (Addendum)
Pt presents with possible abscess to back of neck for 8 days. Pt has been seen for same and has taken all of antibiotics with no improvement. Pt states is getting worse. Moderate size abscess noted to back of neck, hard when palpated.

## 2017-04-23 NOTE — ED Notes (Signed)
I&D performed, dressing applied to area drained, reinforced with tape. Pt requesting more pain medications

## 2017-04-26 ENCOUNTER — Inpatient Hospital Stay
Admission: EM | Admit: 2017-04-26 | Discharge: 2017-04-29 | DRG: 603 | Disposition: A | Discharge status: Home / Self Care | Payer: Self-pay | Attending: Internal Medicine | Admitting: Internal Medicine

## 2017-04-26 ENCOUNTER — Encounter: Payer: Self-pay | Admitting: Medical Oncology

## 2017-04-26 ENCOUNTER — Emergency Department: Payer: Self-pay

## 2017-04-26 DIAGNOSIS — M545 Low back pain: Secondary | ICD-10-CM | POA: Diagnosis present

## 2017-04-26 DIAGNOSIS — L02212 Cutaneous abscess of back [any part, except buttock]: Secondary | ICD-10-CM | POA: Diagnosis present

## 2017-04-26 DIAGNOSIS — F419 Anxiety disorder, unspecified: Secondary | ICD-10-CM | POA: Diagnosis present

## 2017-04-26 DIAGNOSIS — L0211 Cutaneous abscess of neck: Principal | ICD-10-CM | POA: Diagnosis present

## 2017-04-26 DIAGNOSIS — F17210 Nicotine dependence, cigarettes, uncomplicated: Secondary | ICD-10-CM | POA: Diagnosis present

## 2017-04-26 DIAGNOSIS — E669 Obesity, unspecified: Secondary | ICD-10-CM | POA: Diagnosis present

## 2017-04-26 DIAGNOSIS — L03221 Cellulitis of neck: Secondary | ICD-10-CM | POA: Diagnosis present

## 2017-04-26 DIAGNOSIS — Z6835 Body mass index (BMI) 35.0-35.9, adult: Secondary | ICD-10-CM

## 2017-04-26 DIAGNOSIS — G8929 Other chronic pain: Secondary | ICD-10-CM | POA: Diagnosis present

## 2017-04-26 DIAGNOSIS — Z789 Other specified health status: Secondary | ICD-10-CM

## 2017-04-26 DIAGNOSIS — Z79899 Other long term (current) drug therapy: Secondary | ICD-10-CM

## 2017-04-26 DIAGNOSIS — Z79891 Long term (current) use of opiate analgesic: Secondary | ICD-10-CM

## 2017-04-26 HISTORY — DX: Tobacco use: Z72.0

## 2017-04-26 HISTORY — DX: Cutaneous abscess of back (any part, except buttock): L02.212

## 2017-04-26 LAB — CBC WITH DIFFERENTIAL/PLATELET
BASOS ABS: 0.2 10*3/uL — AB (ref 0–0.1)
BASOS PCT: 1 %
EOS PCT: 4 %
Eosinophils Absolute: 0.6 10*3/uL (ref 0–0.7)
HCT: 45.3 % (ref 40.0–52.0)
Hemoglobin: 15.7 g/dL (ref 13.0–18.0)
LYMPHS PCT: 20 %
Lymphs Abs: 2.9 10*3/uL (ref 1.0–3.6)
MCH: 31.6 pg (ref 26.0–34.0)
MCHC: 34.6 g/dL (ref 32.0–36.0)
MCV: 91.4 fL (ref 80.0–100.0)
MONO ABS: 1.1 10*3/uL — AB (ref 0.2–1.0)
Monocytes Relative: 7 %
NEUTROS ABS: 9.8 10*3/uL — AB (ref 1.4–6.5)
NEUTROS PCT: 68 %
PLATELETS: 291 10*3/uL (ref 150–440)
RBC: 4.95 MIL/uL (ref 4.40–5.90)
RDW: 13.7 % (ref 11.5–14.5)
WBC: 14.6 10*3/uL — ABNORMAL HIGH (ref 3.8–10.6)

## 2017-04-26 LAB — BASIC METABOLIC PANEL
Anion gap: 11 (ref 5–15)
BUN: 11 mg/dL (ref 6–20)
CHLORIDE: 102 mmol/L (ref 101–111)
CO2: 21 mmol/L — AB (ref 22–32)
CREATININE: 0.87 mg/dL (ref 0.61–1.24)
Calcium: 9.1 mg/dL (ref 8.9–10.3)
GFR calc non Af Amer: >60 mL/min (ref >60)
GLUCOSE: 122 mg/dL — AB (ref 65–99)
Potassium: 4.2 mmol/L (ref 3.5–5.1)
Sodium: 134 mmol/L — ABNORMAL LOW (ref 135–145)

## 2017-04-26 MED ORDER — OXYCODONE HCL 5 MG PO TABS
10.0000 mg | ORAL_TABLET | Freq: Four times a day (QID) | ORAL | Status: DC
  Administered 2017-04-26 – 2017-04-28 (×8): 10 mg via ORAL
  Filled 2017-04-26 (×8): qty 2

## 2017-04-26 MED ORDER — CEFAZOLIN SODIUM-DEXTROSE 1-4 GM/50ML-% IV SOLN
1.0000 g | Freq: Three times a day (TID) | INTRAVENOUS | Status: DC
  Administered 2017-04-26 – 2017-04-28 (×7): 1 g via INTRAVENOUS
  Filled 2017-04-26 (×9): qty 50

## 2017-04-26 MED ORDER — ONDANSETRON HCL 4 MG PO TABS: 4.0000 mg | ORAL_TABLET | Freq: Four times a day (QID) | ORAL | Status: DC | PRN

## 2017-04-26 MED ORDER — HYDROMORPHONE HCL 1 MG/ML IJ SOLN
0.5000 mg | INTRAMUSCULAR | Status: DC | PRN
  Administered 2017-04-26: 0.5 mg via INTRAVENOUS
  Filled 2017-04-26: qty 1

## 2017-04-26 MED ORDER — CLINDAMYCIN PHOSPHATE 600 MG/50ML IV SOLN
600.0000 mg | Freq: Once | INTRAVENOUS | Status: AC
  Administered 2017-04-26: 600 mg via INTRAVENOUS
  Filled 2017-04-26 (×2): qty 50

## 2017-04-26 MED ORDER — ALBUTEROL SULFATE HFA 108 (90 BASE) MCG/ACT IN AERS: 2.0000 | INHALATION_SPRAY | Freq: Four times a day (QID) | RESPIRATORY_TRACT | Status: DC | PRN

## 2017-04-26 MED ORDER — SODIUM CHLORIDE 0.9 % IV BOLUS (SEPSIS)
1000.0000 mL | Freq: Once | INTRAVENOUS | Status: AC
  Administered 2017-04-26: 1000 mL via INTRAVENOUS

## 2017-04-26 MED ORDER — VANCOMYCIN HCL 10 G IV SOLR
1250.0000 mg | Freq: Once | INTRAVENOUS | Status: DC
  Filled 2017-04-26: qty 1250

## 2017-04-26 MED ORDER — ONDANSETRON HCL 4 MG/2ML IJ SOLN: 4.0000 mg | Freq: Four times a day (QID) | INTRAMUSCULAR | Status: DC | PRN

## 2017-04-26 MED ORDER — ALBUTEROL SULFATE (2.5 MG/3ML) 0.083% IN NEBU: 2.5000 mg | INHALATION_SOLUTION | RESPIRATORY_TRACT | Status: DC | PRN

## 2017-04-26 MED ORDER — KETOROLAC TROMETHAMINE 30 MG/ML IJ SOLN
15.0000 mg | INTRAMUSCULAR | Status: AC
  Administered 2017-04-26: 15 mg via INTRAVENOUS
  Filled 2017-04-26: qty 1

## 2017-04-26 MED ORDER — LORAZEPAM 1 MG PO TABS
1.0000 mg | ORAL_TABLET | Freq: Three times a day (TID) | ORAL | Status: DC | PRN
  Administered 2017-04-26 – 2017-04-29 (×9): 1 mg via ORAL
  Filled 2017-04-26 (×10): qty 1

## 2017-04-26 MED ORDER — HYDROMORPHONE HCL 1 MG/ML IJ SOLN
1.0000 mg | INTRAMUSCULAR | Status: DC | PRN
  Administered 2017-04-26 – 2017-04-29 (×14): 1 mg via INTRAVENOUS
  Filled 2017-04-26 (×14): qty 1

## 2017-04-26 MED ORDER — POLYETHYLENE GLYCOL 3350 17 G PO PACK: 17.0000 g | PACK | Freq: Every day | ORAL | Status: DC | PRN

## 2017-04-26 MED ORDER — ACETAMINOPHEN 650 MG RE SUPP: 650.0000 mg | Freq: Four times a day (QID) | RECTAL | Status: DC | PRN

## 2017-04-26 MED ORDER — ACETAMINOPHEN 325 MG PO TABS
650.0000 mg | ORAL_TABLET | Freq: Four times a day (QID) | ORAL | Status: DC | PRN
  Administered 2017-04-28: 650 mg via ORAL
  Filled 2017-04-26: qty 2

## 2017-04-26 MED ORDER — ENOXAPARIN SODIUM 40 MG/0.4ML ~~LOC~~ SOLN
40.0000 mg | SUBCUTANEOUS | Status: DC
  Administered 2017-04-28: 40 mg via SUBCUTANEOUS
  Filled 2017-04-26: qty 0.4

## 2017-04-26 MED ORDER — HYDROMORPHONE HCL 1 MG/ML IJ SOLN
1.0000 mg | Freq: Once | INTRAMUSCULAR | Status: AC
  Administered 2017-04-26: 1 mg via INTRAVENOUS
  Filled 2017-04-26: qty 1

## 2017-04-26 NOTE — ED Notes (Signed)
Pt given sandwich tray at this time  

## 2017-04-26 NOTE — ED Provider Notes (Signed)
[facility] Emergency Department Provider Note  ____________________________________________  Time seen: Approximately 11:16 AM  I have reviewed the triage vital signs and the nursing notes.   HISTORY  Chief Complaint Abscess    HPI Jeremy Long is a 51 y.o. male who complains of worsening pain and swelling on the right posterior neck for the past 2 weeks. When the symptoms started, he took a prescription of azithromycin that he had at home that he had not taken during a previous illness, which was ineffective. He continued worsening so he came to the ED 3 days ago. It was incised and drained, irrigated, and patient was started on Bactrim which she has been taking for the past 3 days. However, it is continuing to worse with increasing redness pain and swelling. There is still some purulent drainage from the wound. Denies fevers chills sweats headaches changes in vision or mental status, or vomiting. No difficulty swallowing or breathing.  he has a history of abscesses in the soft tissues in the past   Past Medical History:  Diagnosis Date  . Chronic back pain      There are no active problems to display for this patient.    Past Surgical History:  Procedure Laterality Date  . BACK SURGERY       Prior to Admission medications   Medication Sig Start Date End Date Taking? Authorizing Provider  albuterol (PROVENTIL HFA;VENTOLIN HFA) 108 (90 Base) MCG/ACT inhaler Inhale 2 puffs into the lungs every 6 (six) hours as needed for wheezing or shortness of breath. 01/18/17  Yes Lisa Roca, MD  LORazepam (ATIVAN) 1 MG tablet Take 1 mg by mouth 3 (three) times daily as needed. 04/06/17  Yes [provider]  mupirocin ointment (BACTROBAN) 2 % Apply to affected area 3 times daily 04/23/17 04/23/18 Yes Earleen Newport, MD  Oxycodone HCl 10 MG TABS Take 10 mg by mouth 4 (four) times daily.   Yes [provider]  sulfamethoxazole-trimethoprim  (BACTRIM DS) 800-160 MG tablet Take 1 tablet by mouth 2 (two) times daily. 04/23/17  Yes Earleen Newport, MD  clindamycin (CLEOCIN) 150 MG capsule Take 2 capsule qid x 7 days Patient not taking: Reported on 01/18/2017 03/12/16   Johnn Hai, PA-C  cyclobenzaprine (FLEXERIL) 10 MG tablet Take 1 tablet (10 mg total) by mouth 3 (three) times daily as needed for muscle spasms. Patient not taking: Reported on 01/18/2017 06/28/16   Cuthriell, Charline Bills, PA-C  HYDROcodone-acetaminophen (NORCO/VICODIN) 5-325 MG tablet Take 1 tablet by mouth every 4 (four) hours as needed for moderate pain. Patient not taking: Reported on 01/18/2017 03/12/16   Letitia Neri L, PA-C  lidocaine (XYLOCAINE) 2 % solution Use as directed 15 mLs in the mouth or throat every 4 (four) hours as needed (throat pain). Patient not taking: Reported on 04/26/2017 01/18/17   Lisa Roca, MD  meloxicam (MOBIC) 15 MG tablet Take 1 tablet (15 mg total) by mouth daily. Patient not taking: Reported on 01/18/2017 06/28/16   Cuthriell, Charline Bills, PA-C  oxyCODONE-acetaminophen (ROXICET) 5-325 MG tablet Take 1 tablet by mouth every 4 (four) hours as needed for severe pain. Patient not taking: Reported on 01/18/2017 05/06/16   Gregor Hams, MD     Allergies Patient has no known allergies.   No family history on file.  Social History Social History  Substance Use Topics  . Smoking status: Current Every Day Smoker    Packs/day: 0.50    Years: 25.00  Types: Cigarettes  . Smokeless tobacco: Never Used  . Alcohol use No    Review of Systems  Constitutional:   No fever or chills.  ENT:   No sore throat. No rhinorrhea. Cardiovascular:   No chest pain or syncope. Respiratory:   No dyspnea or cough. Gastrointestinal:   Negative for abdominal pain, vomiting and diarrhea.  Musculoskeletal:   right neck pain as above All other systems reviewed and are negative except as documented above in ROS and  HPI.  ____________________________________________   PHYSICAL EXAM:  VITAL SIGNS: ED Triage Vitals [04/26/17 0804]  Enc Vitals Group     BP (!) 138/93     Pulse Rate 84     Resp 18     Temp 98.2 F (36.8 C)     Temp Source Oral     SpO2 98 %     Weight 200 lb (90.7 kg)     Height      Head Circumference      Peak Flow      Pain Score 10     Pain Loc      Pain Edu?      Excl. in [place]     Vital signs reviewed, nursing assessments reviewed.   Constitutional:   Alert and oriented. not in distress. Eyes:   No scleral icterus.  EOMI. No nystagmus. No conjunctival pallor. PERRL. ENT   Head:   Normocephalic and atraumatic.   Nose:   No congestion/rhinnorhea.    Mouth/Throat:   MMM, no pharyngeal erythema. No peritonsillar mass. no trismus   Neck:   No meningismus. Full ROM.no midline spinal tenderness. Right posterior lateral neck has diffuse induration, erythema, tenderness and warmth. There is a persistent skin wound from his previous I&D, with purulent drainage expressible through the wound. No crepitus. No fluctuance.there is honey crusting of the wounds. Hematological/Lymphatic/Immunilogical:   No cervical lymphadenopathy. Cardiovascular:   RRR. Symmetric bilateral radial and DP pulses.  No murmurs.  Respiratory:   Normal respiratory effort without tachypnea/retractions. Breath sounds are clear and equal bilaterally. No wheezes/rales/rhonchi. Gastrointestinal:   Soft and nontender. Non distended. There is no CVA tenderness.  No rebound, rigidity, or guarding. Genitourinary:   deferred Musculoskeletal:   Normal range of motion in all extremities. No joint effusions.  No lower extremity tenderness.  No edema. Neurologic:   Normal speech and language.  Motor grossly intact. No gross focal neurologic deficits are appreciated.  Skin:    Skin is warm, dry with inflammatory changes as above on the neck. No other rash or  wounds.  ____________________________________________    LABS (pertinent positives/negatives) (all labs ordered are listed, but only abnormal results are displayed) Labs Reviewed  BASIC METABOLIC PANEL - Abnormal; Notable for the following:       Result Value   Sodium 134 (*)    CO2 21 (*)    Glucose, Bld 122 (*)    All other components within normal limits  CBC WITH DIFFERENTIAL/PLATELET - Abnormal; Notable for the following:    WBC 14.6 (*)    Neutro Abs 9.8 (*)    Monocytes Absolute 1.1 (*)    Basophils Absolute 0.2 (*)    All other components within normal limits   ____________________________________________   EKG    ____________________________________________    RADIOLOGY  Ct Soft Tissue Neck W Contrast  Result Date: 04/26/2017 CLINICAL DATA:  Abscess on upper neck with pain. EXAM: CT NECK WITH CONTRAST TECHNIQUE: Multidetector CT imaging of the neck  was performed using the standard protocol following the bolus administration of intravenous contrast. CONTRAST:  75 cc Isovue 300 intravenous COMPARISON:  None. FINDINGS: Pharynx and larynx: Negative.  No masslike or inflammatory changes. Salivary glands: Negative Thyroid: Negative Lymph nodes: Mildly enlarged suboccipital and posterior triangle lymph nodes which are presumably reactive in this setting. No suppurative changes. Vascular: Atherosclerotic calcification of the carotid bifurcations and siphons. No acute vascular finding. Limited intracranial: Negative Visualized orbits: Negative Mastoids and visualized paranasal sinuses: Mild mucosal thickening in the left maxillary sinus which shows sclerotic wall thickening from chronic sinusitis. Skeleton: No acute or aggressive finding. Periapical erosion around central mandibular incisors. Upper chest: Nonspecific airway thickening. Other: There is subcutaneous reticulation and fat expansion with skin thickening in the right suboccipital scalp and neck. No rim enhancing fluid  collection, but the abnormality has not resolved by the level of the external occipital protuberance, which is the upper margin of the study. No bony erosion. Mild expansion and indistinctness of right splenius capitis. IMPRESSION: Cellulitis in the right occipital scalp and suboccipital neck with phlegmon but no rim enhancing abscess. Inflammatory changes extend beyond the upper slice of this study which covers the external occipital protuberance and below. Please correlate for level of suspected abscess. Mild myositis of splenius capitus. Electronically Signed   By: Monte Fantasia M.D.   On: 04/26/2017 10:47    ____________________________________________   PROCEDURES Procedures  ____________________________________________   DIFFERENTIAL DIAGNOSIS  abscess, cellulitis, necrotizing fasciitis  CLINICAL IMPRESSION / ASSESSMENT AND PLAN / ED COURSE  Pertinent labs & imaging results that were available during my care of the patient were reviewed by me and considered in my medical decision making (see chart for details).   low suspicion for osteomyelitis. No evidence of RPA or PTA. I doubt any CNS extension involving the spine or brain. CT of the cervical spine was obtained which does not show any abscess. Although the inflammatory changes extend to the images at the end of the CT sequence, clinically there is no evidence of significant infection superior to the extent of the CT scan.  Patient is not septic. Started on IV clindamycin, case discussed with hospitalist for further management given the anatomic sensitivity of the cellulitis, outpatient treatment failure with continued worsening. On exam is also likely to be infected with both staph and strep.     ____________________________________________   FINAL CLINICAL IMPRESSION(S) / ED DIAGNOSES    Final diagnoses:  Cellulitis of neck  Failure of outpatient treatment      New Prescriptions   No medications on file      Portions of this note were generated with dragon dictation software. Dictation errors may occur despite best attempts at proofreading.    Carrie Mew, MD 04/26/17 1121

## 2017-04-26 NOTE — ED Notes (Signed)
Pt asking for pain meds, states he takes oxycodone every day for his back but and it doesn't help with his neck

## 2017-04-26 NOTE — Consult Note (Signed)
Pt with neck abscess Full consult once gen power restored Plan I&D in am Orders entered

## 2017-04-26 NOTE — ED Triage Notes (Signed)
Pt reports he was seen here Monday to have abscess drained, pt reports no improvement.

## 2017-04-26 NOTE — Progress Notes (Signed)
Pt requested that primary RN contact MD in regard to pt dilaudid 0.5 to be increased to 1 mg. Primary RN paged and spoke to Dr. Ulysees Barns. Ordered received for 1 mg Dilaudid Q 4 HR prn. Primary RN to continue to monitor.

## 2017-04-26 NOTE — ED Notes (Signed)
Pharmacy notified to send Clindamycin dose.

## 2017-04-26 NOTE — ED Notes (Signed)
PT asking for pain meds, pt given his home oxycodone aprox 1hr ago, pt medicated with home pain meds 1330

## 2017-04-26 NOTE — ED Notes (Signed)
Hospitalist to bedside at this time 

## 2017-04-26 NOTE — ED Notes (Signed)
Pt has abscess on upper neck and c/o pain and swelling around abscess. Pt was prescribed antibiotics and states they have been ineffective. Pain has 10/10 pain.

## 2017-04-26 NOTE — H&P (Signed)
[facility] [place] NAME: Jeremy Long    MR#:  [id number]  DATE OF BIRTH:  03-Jul-1966  DATE OF ADMISSION:  04/26/2017  PRIMARY CARE PHYSICIAN: Patient, No Pcp Per   REQUESTING/REFERRING PHYSICIAN: Dr. Joni Fears  CHIEF COMPLAINT:   Chief Complaint  Patient presents with  . Abscess    HISTORY OF PRESENT ILLNESS:  Jobe Mutch  is a 51 y.o. male with a known history of Chronic low back pain on high-dose narcotics, tobacco abuse presents to the emergency room due to worsening cellulitis/abscess of upper back and lower neck. Patient was seen here in the emergency room on 04/23/2017 and sent home on Bactrim. I&D was done. Patient's swelling has worsened and he has noticed pus draining and return to emergency room. Here CT scan of the neck was done which shows some fluid collection likely phlegmon. Patient has significant redness surrounding the area and pain. Is being admitted due to failed outpatient treatment. He does go to pain clinic and it gets narcotics from there. On anxiety medication with Ativan.  PAST MEDICAL HISTORY:   Past Medical History:  Diagnosis Date  . Chronic back pain   . Tobacco use     PAST SURGICAL HISTORY:   Past Surgical History:  Procedure Laterality Date  . BACK SURGERY      SOCIAL HISTORY:   Social History  Substance Use Topics  . Smoking status: Current Every Day Smoker    Packs/day: 0.50    Years: 25.00    Types: Cigarettes  . Smokeless tobacco: Never Used  . Alcohol use No    FAMILY HISTORY:   Family History  Problem Relation Age of Onset  . Premature CHD Neg Hx     DRUG ALLERGIES:  No Known Allergies  REVIEW OF SYSTEMS:   Review of Systems  Constitutional: Positive for malaise/fatigue. Negative for chills, fever and weight loss.  HENT: Negative for hearing loss and nosebleeds.   Eyes: Negative for blurred vision, double vision and pain.  Respiratory: Negative for cough,  hemoptysis, sputum production, shortness of breath and wheezing.   Cardiovascular: Negative for chest pain, palpitations, orthopnea and leg swelling.  Gastrointestinal: Negative for abdominal pain, constipation, diarrhea, nausea and vomiting.  Genitourinary: Negative for dysuria and hematuria.  Musculoskeletal: Positive for neck pain. Negative for back pain, falls and myalgias.  Skin: Negative for rash.  Neurological: Positive for weakness. Negative for dizziness, tremors, sensory change, speech change, focal weakness, seizures and headaches.  Endo/Heme/Allergies: Does not bruise/bleed easily.  Psychiatric/Behavioral: Negative for depression and memory loss. The patient is not nervous/anxious.     MEDICATIONS AT HOME:   Prior to Admission medications   Medication Sig Start Date End Date Taking? Authorizing Provider  albuterol (PROVENTIL HFA;VENTOLIN HFA) 108 (90 Base) MCG/ACT inhaler Inhale 2 puffs into the lungs every 6 (six) hours as needed for wheezing or shortness of breath. 01/18/17  Yes Lisa Roca, MD  LORazepam (ATIVAN) 1 MG tablet Take 1 mg by mouth 3 (three) times daily as needed. 04/06/17  Yes [provider]  mupirocin ointment (BACTROBAN) 2 % Apply to affected area 3 times daily 04/23/17 04/23/18 Yes Earleen Newport, MD  Oxycodone HCl 10 MG TABS Take 10 mg by mouth 4 (four) times daily.   Yes [provider]  sulfamethoxazole-trimethoprim (BACTRIM DS) 800-160 MG tablet Take 1 tablet by mouth 2 (two) times daily. 04/23/17  Yes Earleen Newport, MD  clindamycin (CLEOCIN) 150 MG capsule Take  2 capsule qid x 7 days Patient not taking: Reported on 01/18/2017 03/12/16   Johnn Hai, PA-C  cyclobenzaprine (FLEXERIL) 10 MG tablet Take 1 tablet (10 mg total) by mouth 3 (three) times daily as needed for muscle spasms. Patient not taking: Reported on 01/18/2017 06/28/16   Cuthriell, Charline Bills, PA-C  HYDROcodone-acetaminophen (NORCO/VICODIN) 5-325 MG tablet Take 1  tablet by mouth every 4 (four) hours as needed for moderate pain. Patient not taking: Reported on 01/18/2017 03/12/16   Letitia Neri L, PA-C  lidocaine (XYLOCAINE) 2 % solution Use as directed 15 mLs in the mouth or throat every 4 (four) hours as needed (throat pain). Patient not taking: Reported on 04/26/2017 01/18/17   Lisa Roca, MD  meloxicam (MOBIC) 15 MG tablet Take 1 tablet (15 mg total) by mouth daily. Patient not taking: Reported on 01/18/2017 06/28/16   Cuthriell, Charline Bills, PA-C  oxyCODONE-acetaminophen (ROXICET) 5-325 MG tablet Take 1 tablet by mouth every 4 (four) hours as needed for severe pain. Patient not taking: Reported on 01/18/2017 05/06/16   Gregor Hams, MD     VITAL SIGNS:  Blood pressure (!) 131/93, pulse 70, temperature 98.2 F (36.8 C), temperature source Oral, resp. rate 18, weight 90.7 kg (200 lb), SpO2 96 %.  PHYSICAL EXAMINATION:  Physical Exam  GENERAL:  51 y.o.-year-old patient lying in the bed with no acute distress.  EYES: Pupils equal, round, reactive to light and accommodation. No scleral icterus. Extraocular muscles intact.  HEENT: Head atraumatic, normocephalic. Oropharynx and nasopharynx clear. No oropharyngeal erythema, moist oral mucosa  NECK:  Supple, no jugular venous distention. No thyroid enlargement, no tenderness.  LUNGS: Normal breath sounds bilaterally, no wheezing, rales, rhonchi. No use of accessory muscles of respiration.  CARDIOVASCULAR: S1, S2 normal. No murmurs, rubs, or gallops.  ABDOMEN: Soft, nontender, nondistended. Bowel sounds present. No organomegaly or mass.  EXTREMITIES: No pedal edema, cyanosis, or clubbing. + 2 pedal & radial pulses b/l.   NEUROLOGIC: Cranial nerves II through XII are intact. No focal Motor or sensory deficits appreciated b/l PSYCHIATRIC: The patient is alert and oriented x 3. Good affect.  SKIN: Redness and erythema with induration in the lower neck area. Surrounding erythema, warmth. Minimal discharge  of pus.  LABORATORY PANEL:   CBC  Recent Labs Lab 04/26/17 0905  WBC 14.6*  HGB 15.7  HCT 45.3  PLT 291   ------------------------------------------------------------------------------------------------------------------  Chemistries   Recent Labs Lab 04/26/17 0905  NA 134*  K 4.2  CL 102  CO2 21*  GLUCOSE 122*  BUN 11  CREATININE 0.87  CALCIUM 9.1   ------------------------------------------------------------------------------------------------------------------  Cardiac Enzymes No results for input(s): TROPONINI in the last 168 hours. ------------------------------------------------------------------------------------------------------------------  RADIOLOGY:  Ct Soft Tissue Neck W Contrast  Result Date: 04/26/2017 CLINICAL DATA:  Abscess on upper neck with pain. EXAM: CT NECK WITH CONTRAST TECHNIQUE: Multidetector CT imaging of the neck was performed using the standard protocol following the bolus administration of intravenous contrast. CONTRAST:  75 cc Isovue 300 intravenous COMPARISON:  None. FINDINGS: Pharynx and larynx: Negative.  No masslike or inflammatory changes. Salivary glands: Negative Thyroid: Negative Lymph nodes: Mildly enlarged suboccipital and posterior triangle lymph nodes which are presumably reactive in this setting. No suppurative changes. Vascular: Atherosclerotic calcification of the carotid bifurcations and siphons. No acute vascular finding. Limited intracranial: Negative Visualized orbits: Negative Mastoids and visualized paranasal sinuses: Mild mucosal thickening in the left maxillary sinus which shows sclerotic wall thickening from chronic sinusitis. Skeleton: No acute or aggressive  finding. Periapical erosion around central mandibular incisors. Upper chest: Nonspecific airway thickening. Other: There is subcutaneous reticulation and fat expansion with skin thickening in the right suboccipital scalp and neck. No rim enhancing fluid collection, but  the abnormality has not resolved by the level of the external occipital protuberance, which is the upper margin of the study. No bony erosion. Mild expansion and indistinctness of right splenius capitis. IMPRESSION: Cellulitis in the right occipital scalp and suboccipital neck with phlegmon but no rim enhancing abscess. Inflammatory changes extend beyond the upper slice of this study which covers the external occipital protuberance and below. Please correlate for level of suspected abscess. Mild myositis of splenius capitus. Electronically Signed   By: Monte Fantasia M.D.   On: 04/26/2017 10:47     IMPRESSION AND PLAN:   * Cellulitis and abscess of the lower neck area. Patient has failed outpatient treatment with Bactrim in spite of having I&D. Will admit start on IV cefazolin. Wound swabs have been sent from the emergency room. We will wait for cultures. Consult surgery for incision and drainage. Pain medications ordered as needed.  * Chronic low back pain. Continue home medications.  * DVT prophylaxis with Lovenox  All the records are reviewed and case discussed with ED provider. Management plans discussed with the patient, family and they are in agreement.  CODE STATUS: Full code  TOTAL TIME TAKING CARE OF THIS PATIENT: 40 minutes.   Hillary Bow R M.D on 04/26/2017 at 12:27 PM  Between 7am to 6pm - Pager - [phone]  After 6pm go to [email or web address] - password [email or web address] [place] [facility]  Office  [phone]  CC: Primary care physician; Patient, No Pcp Per  Note: This dictation was prepared with Dragon dictation along with smaller phrase technology. Any transcriptional errors that result from this process are unintentional.

## 2017-04-26 NOTE — ED Notes (Signed)
Patient transported to CT 

## 2017-04-26 NOTE — ED Notes (Signed)
Lab notified to add on blood work to previous specimens.

## 2017-04-27 ENCOUNTER — Inpatient Hospital Stay: Payer: Self-pay | Admitting: Anesthesiology

## 2017-04-27 ENCOUNTER — Encounter
Admission: EM | Disposition: A | Discharge status: Home / Self Care | Payer: Self-pay | Source: Home / Self Care | Attending: Internal Medicine

## 2017-04-27 ENCOUNTER — Encounter: Payer: Self-pay | Admitting: Anesthesiology

## 2017-04-27 DIAGNOSIS — L02212 Cutaneous abscess of back [any part, except buttock]: Secondary | ICD-10-CM

## 2017-04-27 HISTORY — PX: INCISION AND DRAINAGE ABSCESS: SHX5864

## 2017-04-27 LAB — BASIC METABOLIC PANEL
ANION GAP: 7 (ref 5–15)
BUN: 14 mg/dL (ref 6–20)
CALCIUM: 8.8 mg/dL — AB (ref 8.9–10.3)
CO2: 26 mmol/L (ref 22–32)
Chloride: 107 mmol/L (ref 101–111)
Creatinine, Ser: 1.09 mg/dL (ref 0.61–1.24)
GFR calc Af Amer: >60 mL/min (ref >60)
Glucose, Bld: 142 mg/dL — ABNORMAL HIGH (ref 65–99)
POTASSIUM: 4.2 mmol/L (ref 3.5–5.1)
SODIUM: 140 mmol/L (ref 135–145)

## 2017-04-27 LAB — CBC
HEMATOCRIT: 45.1 % (ref 40.0–52.0)
HEMOGLOBIN: 15.3 g/dL (ref 13.0–18.0)
MCH: 31.7 pg (ref 26.0–34.0)
MCHC: 34 g/dL (ref 32.0–36.0)
MCV: 93.4 fL (ref 80.0–100.0)
Platelets: 284 10*3/uL (ref 150–440)
RBC: 4.82 MIL/uL (ref 4.40–5.90)
RDW: 13.7 % (ref 11.5–14.5)
WBC: 10.4 10*3/uL (ref 3.8–10.6)

## 2017-04-27 LAB — SURGICAL PCR SCREEN
MRSA, PCR: POSITIVE — AB
STAPHYLOCOCCUS AUREUS: POSITIVE — AB

## 2017-04-27 SURGERY — INCISION AND DRAINAGE, ABSCESS
Anesthesia: General | Laterality: Right

## 2017-04-27 MED ORDER — FENTANYL CITRATE (PF) 100 MCG/2ML IJ SOLN
INTRAMUSCULAR | Status: AC
  Filled 2017-04-27: qty 2

## 2017-04-27 MED ORDER — LIDOCAINE HCL (PF) 2 % IJ SOLN
INTRAMUSCULAR | Status: AC
  Filled 2017-04-27: qty 10

## 2017-04-27 MED ORDER — MUPIROCIN 2 % EX OINT
1.0000 "application " | TOPICAL_OINTMENT | Freq: Two times a day (BID) | CUTANEOUS | Status: DC
  Filled 2017-04-27: qty 22

## 2017-04-27 MED ORDER — FENTANYL CITRATE (PF) 100 MCG/2ML IJ SOLN
INTRAMUSCULAR | Status: DC | PRN
Start: 2017-04-27 — End: 2017-04-27
  Administered 2017-04-27: 100 ug via INTRAVENOUS
  Administered 2017-04-27 (×2): 50 ug via INTRAVENOUS

## 2017-04-27 MED ORDER — HYDROMORPHONE HCL 1 MG/ML IJ SOLN
0.5000 mg | INTRAMUSCULAR | Status: AC | PRN
  Administered 2017-04-27 (×4): 0.5 mg via INTRAVENOUS

## 2017-04-27 MED ORDER — SUGAMMADEX SODIUM 200 MG/2ML IV SOLN
INTRAVENOUS | Status: AC
Start: 2017-04-27 — End: ?
  Filled 2017-04-27: qty 2

## 2017-04-27 MED ORDER — PROPOFOL 10 MG/ML IV BOLUS
INTRAVENOUS | Status: AC
  Filled 2017-04-27: qty 20

## 2017-04-27 MED ORDER — ONDANSETRON HCL 4 MG/2ML IJ SOLN
INTRAMUSCULAR | Status: AC
  Filled 2017-04-27: qty 2

## 2017-04-27 MED ORDER — ACETAMINOPHEN 325 MG PO TABS
ORAL_TABLET | ORAL | Status: AC
  Filled 2017-04-27: qty 3

## 2017-04-27 MED ORDER — PROPOFOL 10 MG/ML IV BOLUS
INTRAVENOUS | Status: DC | PRN
  Administered 2017-04-27: 200 mg via INTRAVENOUS

## 2017-04-27 MED ORDER — MIDAZOLAM HCL 2 MG/2ML IJ SOLN
INTRAMUSCULAR | Status: DC | PRN
  Administered 2017-04-27: 2 mg via INTRAVENOUS

## 2017-04-27 MED ORDER — HYDROMORPHONE HCL 1 MG/ML IJ SOLN
INTRAMUSCULAR | Status: AC
  Administered 2017-04-27: 0.5 mg via INTRAVENOUS
  Filled 2017-04-27: qty 1

## 2017-04-27 MED ORDER — MUPIROCIN 2 % EX OINT
1.0000 "application " | TOPICAL_OINTMENT | Freq: Two times a day (BID) | CUTANEOUS | Status: DC
  Administered 2017-04-27 – 2017-04-28 (×4): 1 via NASAL
  Filled 2017-04-27: qty 22

## 2017-04-27 MED ORDER — VANCOMYCIN HCL 10 G IV SOLR
1250.0000 mg | Freq: Two times a day (BID) | INTRAVENOUS | Status: DC
  Filled 2017-04-27: qty 1250

## 2017-04-27 MED ORDER — FENTANYL CITRATE (PF) 100 MCG/2ML IJ SOLN
INTRAMUSCULAR | Status: AC
  Administered 2017-04-27: 25 ug via INTRAVENOUS
  Filled 2017-04-27: qty 2

## 2017-04-27 MED ORDER — SODIUM CHLORIDE 0.9 % IV SOLN
INTRAVENOUS | Status: DC
  Administered 2017-04-27 (×2): via INTRAVENOUS

## 2017-04-27 MED ORDER — ACETAMINOPHEN 500 MG PO TABS
1000.0000 mg | ORAL_TABLET | Freq: Once | ORAL | Status: AC
  Administered 2017-04-27: 975 mg via ORAL

## 2017-04-27 MED ORDER — ONDANSETRON HCL 4 MG/2ML IJ SOLN
INTRAMUSCULAR | Status: DC | PRN
  Administered 2017-04-27: 4 mg via INTRAVENOUS

## 2017-04-27 MED ORDER — FENTANYL CITRATE (PF) 100 MCG/2ML IJ SOLN
25.0000 ug | INTRAMUSCULAR | Status: DC | PRN
  Administered 2017-04-27 (×4): 25 ug via INTRAVENOUS

## 2017-04-27 MED ORDER — MIDAZOLAM HCL 2 MG/2ML IJ SOLN
INTRAMUSCULAR | Status: AC
Start: 2017-04-27 — End: ?
  Filled 2017-04-27: qty 2

## 2017-04-27 MED ORDER — ROCURONIUM BROMIDE 50 MG/5ML IV SOLN
INTRAVENOUS | Status: AC
  Filled 2017-04-27: qty 1

## 2017-04-27 MED ORDER — LIDOCAINE HCL (CARDIAC) 20 MG/ML IV SOLN
INTRAVENOUS | Status: DC | PRN
  Administered 2017-04-27: 100 mg via INTRAVENOUS

## 2017-04-27 MED ORDER — CHLORHEXIDINE GLUCONATE CLOTH 2 % EX PADS
6.0000 | MEDICATED_PAD | Freq: Every day | CUTANEOUS | Status: DC
  Administered 2017-04-27 – 2017-04-29 (×3): 6 via TOPICAL

## 2017-04-27 MED ORDER — VANCOMYCIN HCL 10 G IV SOLR
1250.0000 mg | Freq: Two times a day (BID) | INTRAVENOUS | Status: DC
  Administered 2017-04-28: 1250 mg via INTRAVENOUS
  Filled 2017-04-27 (×2): qty 1250

## 2017-04-27 MED ORDER — SUCCINYLCHOLINE CHLORIDE 20 MG/ML IJ SOLN
INTRAMUSCULAR | Status: DC | PRN
  Administered 2017-04-27: 100 mg via INTRAVENOUS

## 2017-04-27 MED ORDER — DEXAMETHASONE SODIUM PHOSPHATE 10 MG/ML IJ SOLN
INTRAMUSCULAR | Status: AC
  Filled 2017-04-27: qty 1

## 2017-04-27 MED ORDER — VANCOMYCIN HCL 10 G IV SOLR
2000.0000 mg | Freq: Once | INTRAVENOUS | Status: AC
  Administered 2017-04-27: 2000 mg via INTRAVENOUS
  Filled 2017-04-27: qty 2000

## 2017-04-27 MED ORDER — ONDANSETRON HCL 4 MG/2ML IJ SOLN: 4.0000 mg | Freq: Once | INTRAMUSCULAR | Status: DC | PRN

## 2017-04-27 MED ORDER — VANCOMYCIN HCL 10 G IV SOLR
2000.0000 mg | Freq: Once | INTRAVENOUS | Status: DC
  Filled 2017-04-27: qty 2000

## 2017-04-27 MED ORDER — CHLORHEXIDINE GLUCONATE CLOTH 2 % EX PADS
6.0000 | MEDICATED_PAD | Freq: Every day | CUTANEOUS | Status: DC
  Administered 2017-04-27: 6 via TOPICAL

## 2017-04-27 SURGICAL SUPPLY — 22 items
BLADE SURG 15 STRL LF DISP TIS (BLADE) ×1 IMPLANT
BLADE SURG 15 STRL SS (BLADE) ×2
CHLORAPREP W/TINT 10.5 ML (MISCELLANEOUS) ×3 IMPLANT
DRAIN PENROSE 1/4X12 LTX (DRAIN) ×3 IMPLANT
DRAPE LAPAROTOMY 100X77 ABD (DRAPES) ×3 IMPLANT
DRAPE UTILITY 15X26 TOWEL STRL (DRAPES) ×3 IMPLANT
ELECT CAUTERY BLADE 6.4 (BLADE) ×3 IMPLANT
ELECT REM PT RETURN 9FT ADLT (ELECTROSURGICAL) ×3
ELECTRODE REM PT RTRN 9FT ADLT (ELECTROSURGICAL) ×1 IMPLANT
GAUZE SPONGE 4X4 12PLY STRL (GAUZE/BANDAGES/DRESSINGS) ×3 IMPLANT
GLOVE BIO SURGEON STRL SZ8 (GLOVE) ×3 IMPLANT
GOWN STRL REUS W/ TWL LRG LVL3 (GOWN DISPOSABLE) ×1 IMPLANT
GOWN STRL REUS W/TWL LRG LVL3 (GOWN DISPOSABLE) ×2
KIT RM TURNOVER STRD PROC AR (KITS) ×3 IMPLANT
NEEDLE HYPO 25X1 1.5 SAFETY (NEEDLE) ×3 IMPLANT
NS IRRIG 500ML POUR BTL (IV SOLUTION) ×3 IMPLANT
PACK BASIN MINOR ARMC (MISCELLANEOUS) ×3 IMPLANT
SPONGE LAP 18X18 5 PK (GAUZE/BANDAGES/DRESSINGS) ×3 IMPLANT
SUT ETHILON 3-0 FS-10 30 BLK (SUTURE) ×3
SUTURE EHLN 3-0 FS-10 30 BLK (SUTURE) ×1 IMPLANT
SYR BULB EAR ULCER 3OZ GRN STR (SYRINGE) ×3 IMPLANT
SYRINGE 10CC LL (SYRINGE) ×3 IMPLANT

## 2017-04-27 NOTE — Anesthesia Preprocedure Evaluation (Addendum)
Anesthesia Evaluation  Patient identified by MRN, date of birth, ID band Patient awake    Reviewed: Allergy & Precautions, NPO status , Patient's Chart, lab work & pertinent test results, reviewed documented beta blocker date and time   Airway Mallampati: III  TM Distance: >3 FB     Dental  (+) Chipped   Pulmonary Current Smoker,           Cardiovascular      Neuro/Psych    GI/Hepatic   Endo/Other    Renal/GU      Musculoskeletal   Abdominal   Peds  Hematology   Anesthesia Other Findings Obese.  Reproductive/Obstetrics                            Anesthesia Physical Anesthesia Plan  ASA: III  Anesthesia Plan: General   Post-op Pain Management:    Induction: Intravenous  PONV Risk Score and Plan:   Airway Management Planned: Oral ETT  Additional Equipment:   Intra-op Plan:   Post-operative Plan:   Informed Consent: I have reviewed the patients History and Physical, chart, labs and discussed the procedure including the risks, benefits and alternatives for the proposed anesthesia with the patient or authorized representative who has indicated his/her understanding and acceptance.     Plan Discussed with: CRNA  Anesthesia Plan Comments:         Anesthesia Quick Evaluation

## 2017-04-27 NOTE — Progress Notes (Signed)
Per Pharmacist, give Vanc when pt gets back from surgery. Will readjust times if necessary.

## 2017-04-27 NOTE — Care Management (Signed)
Patient admitted with neck abscess.  Patient is uninsured.  States that he plans on applying for Medicaid.  PCP Kevan Ny at [facility] in [place].  Patient has also been to [facility] recently and utilized their pharmacy. Patient states that he will require medication assistance at discharge for antibiotics.  [facility] or good[employer]rx may need to be utilized.  RNCM following.

## 2017-04-27 NOTE — Consult Note (Signed)
Surgical Consultation  04/27/2017  Jeremy Long is an 51 y.o. male.   Referring Physician: Sudini  ZE:SPQZ abscess  HPI: this consult was performed last night but due to the power failure dictation cannot be performed and this is an update. I have seen him this morning as well. He is planned for surgery later today to fully I&D a recurrent abscess on his posterior neck. Today he states he still having pain.  He had a I&D 3 days ago in the emergency room but steadily worsened after that came back to the emergency room with worsening pain more erythema and induration and drainage. His never had an episode like this before is not sure if he's had fevers or chills.  Past Medical History:  Diagnosis Date  . Chronic back pain   . Tobacco use     Past Surgical History:  Procedure Laterality Date  . BACK SURGERY      Family History  Problem Relation Age of Onset  . Premature CHD Neg Hx     Social History:  reports that he has been smoking Cigarettes.  He has a 12.50 pack-year smoking history. He has never used smokeless tobacco. He reports that he does not drink alcohol or use drugs.  Allergies: No Known Allergies  Medications reviewed.   Review of Systems:   Review of Systems  Constitutional: Negative.   HENT: Negative.   Eyes: Negative.   Respiratory: Negative.   Cardiovascular: Negative.   Gastrointestinal: Negative.   Genitourinary: Negative.   Musculoskeletal: Negative.   Skin: Negative.   Neurological: Negative.   Endo/Heme/Allergies: Negative.   Psychiatric/Behavioral: Negative.      Physical Exam:  BP (!) 112/59 (BP Location: Left Arm)   Pulse 77   Temp 98.7 F (37.1 C) (Oral)   Resp 16   Ht _0  (1.676 m)   Wt 209 lb 4.8 oz (94.9 kg)   SpO2 98%   BMI 33.78 kg/m   Physical Exam  Constitutional: He is oriented to person, place, and time and well-developed, well-nourished, and in no distress. No distress.  HENT:  Head: Normocephalic.   Occipital posterior neck mass with induration drainage purulence erythema and a small incision not adequately ing. Considerable tenderness  Eyes: Pupils are equal, round, and reactive to light. Right eye exhibits no discharge. Left eye exhibits no discharge. No scleral icterus.  Neck: Normal range of motion.  Cardiovascular: Normal rate, regular rhythm and normal heart sounds.   Pulmonary/Chest: Effort normal and breath sounds normal. No respiratory distress. He has no wheezes. He has no rales.  Abdominal: Soft. He exhibits no distension. There is no tenderness. There is no rebound.  Musculoskeletal: He exhibits no edema.  Lymphadenopathy:    He has no cervical adenopathy.  Neurological: He is alert and oriented to person, place, and time.  Skin: Skin is warm. He is not diaphoretic. There is erythema.  Psychiatric: Mood and affect normal.  Vitals reviewed.     Results for orders placed or performed during the hospital encounter of 04/26/17 (from the past 48 hour(s))  Basic metabolic panel     Status: Abnormal   Collection Time: 04/26/17  9:05 AM  Result Value Ref Range   Sodium 134 (L) 135 - 145 mmol/L   Potassium 4.2 3.5 - 5.1 mmol/L   Chloride 102 101 - 111 mmol/L   CO2 21 (L) 22 - 32 mmol/L   Glucose, Bld 122 (H) 65 - 99 mg/dL   BUN 11 6 -  20 mg/dL   Creatinine, Ser 0.87 0.61 - 1.24 mg/dL   Calcium 9.1 8.9 - 10.3 mg/dL   GFR calc non Af Amer >60 >60 mL/min   GFR calc Af Amer >60 >60 mL/min    Comment: (NOTE) The eGFR has been calculated using the CKD EPI equation. This calculation has not been validated in all clinical situations. eGFR's persistently <60 mL/min signify possible Chronic Kidney Disease.    Anion gap 11 5 - 15  CBC with Differential     Status: Abnormal   Collection Time: 04/26/17  9:05 AM  Result Value Ref Range   WBC 14.6 (H) 3.8 - 10.6 K/uL   RBC 4.95 4.40 - 5.90 MIL/uL   Hemoglobin 15.7 13.0 - 18.0 g/dL   HCT 45.3 40.0 - 52.0 %   MCV 91.4 80.0 -  100.0 fL   MCH 31.6 26.0 - 34.0 pg   MCHC 34.6 32.0 - 36.0 g/dL   RDW 13.7 11.5 - 14.5 %   Platelets 291 150 - 440 K/uL   Neutrophils Relative % 68 %   Neutro Abs 9.8 (H) 1.4 - 6.5 K/uL   Lymphocytes Relative 20 %   Lymphs Abs 2.9 1.0 - 3.6 K/uL   Monocytes Relative 7 %   Monocytes Absolute 1.1 (H) 0.2 - 1.0 K/uL   Eosinophils Relative 4 %   Eosinophils Absolute 0.6 0 - 0.7 K/uL   Basophils Relative 1 %   Basophils Absolute 0.2 (H) 0 - 0.1 K/uL  Wound or Superficial Culture     Status: None (Preliminary result)   Collection Time: 04/26/17 11:20 AM  Result Value Ref Range   Specimen Description ABSCESS NECK    Special Requests PENDING    Gram Stain      MODERATE WBC PRESENT,BOTH PMN AND MONONUCLEAR FEW GRAM POSITIVE COCCI IN PAIRS Performed at [facility], [address], [place], [place] [address]    Culture PENDING    Report Status PENDING   Basic metabolic panel     Status: Abnormal   Collection Time: 04/27/17  3:57 AM  Result Value Ref Range   Sodium 140 135 - 145 mmol/L   Potassium 4.2 3.5 - 5.1 mmol/L   Chloride 107 101 - 111 mmol/L   CO2 26 22 - 32 mmol/L   Glucose, Bld 142 (H) 65 - 99 mg/dL   BUN 14 6 - 20 mg/dL   Creatinine, Ser 1.09 0.61 - 1.24 mg/dL   Calcium 8.8 (L) 8.9 - 10.3 mg/dL   GFR calc non Af Amer >60 >60 mL/min   GFR calc Af Amer >60 >60 mL/min    Comment: (NOTE) The eGFR has been calculated using the CKD EPI equation. This calculation has not been validated in all clinical situations. eGFR's persistently <60 mL/min signify possible Chronic Kidney Disease.    Anion gap 7 5 - 15  CBC     Status: None   Collection Time: 04/27/17  3:57 AM  Result Value Ref Range   WBC 10.4 3.8 - 10.6 K/uL   RBC 4.82 4.40 - 5.90 MIL/uL   Hemoglobin 15.3 13.0 - 18.0 g/dL   HCT 45.1 40.0 - 52.0 %   MCV 93.4 80.0 - 100.0 fL   MCH 31.7 26.0 - 34.0 pg   MCHC 34.0 32.0 - 36.0 g/dL   RDW 13.7 11.5 - 14.5 %   Platelets 284 150 - 440 K/uL  Surgical pcr screen      Status: Abnormal  Collection Time: 04/27/17  4:20 AM  Result Value Ref Range   MRSA, PCR POSITIVE (A) NEGATIVE    Comment: RESULT CALLED TO, READ BACK BY AND VERIFIED WITH: STEVEN SYKES AT 0617 ON 04/27/17 [place].    Staphylococcus aureus POSITIVE (A) NEGATIVE    Comment: (NOTE) The Xpert SA Assay (FDA approved for NASAL specimens in patients 70 years of age and older), is one component of a comprehensive surveillance program. It is not intended to diagnose infection nor to guide or monitor treatment.    Ct Soft Tissue Neck W Contrast  Result Date: 04/26/2017 CLINICAL DATA:  Abscess on upper neck with pain. EXAM: CT NECK WITH CONTRAST TECHNIQUE: Multidetector CT imaging of the neck was performed using the standard protocol following the bolus administration of intravenous contrast. CONTRAST:  75 cc Isovue 300 intravenous COMPARISON:  None. FINDINGS: Pharynx and larynx: Negative.  No masslike or inflammatory changes. Salivary glands: Negative Thyroid: Negative Lymph nodes: Mildly enlarged suboccipital and posterior triangle lymph nodes which are presumably reactive in this setting. No suppurative changes. Vascular: Atherosclerotic calcification of the carotid bifurcations and siphons. No acute vascular finding. Limited intracranial: Negative Visualized orbits: Negative Mastoids and visualized paranasal sinuses: Mild mucosal thickening in the left maxillary sinus which shows sclerotic wall thickening from chronic sinusitis. Skeleton: No acute or aggressive finding. Periapical erosion around central mandibular incisors. Upper chest: Nonspecific airway thickening. Other: There is subcutaneous reticulation and fat expansion with skin thickening in the right suboccipital scalp and neck. No rim enhancing fluid collection, but the abnormality has not resolved by the level of the external occipital protuberance, which is the upper margin of the study. No bony erosion. Mild expansion and indistinctness of  right splenius capitis. IMPRESSION: Cellulitis in the right occipital scalp and suboccipital neck with phlegmon but no rim enhancing abscess. Inflammatory changes extend beyond the upper slice of this study which covers the external occipital protuberance and below. Please correlate for level of suspected abscess. Mild myositis of splenius capitus. Electronically Signed   By: Monte Fantasia M.D.   On: 04/26/2017 10:47    Assessment/Plan:  Recurrent neck abscess requires I&D. He is scheduled for today. The rationale for performing this was discussed with the patient the options of observation reviewed the risk bleeding infection recurrence open wound and cosmetic deformity were all reviewed he understood and agreed to proceed  Florene Glen, MD, FACS

## 2017-04-27 NOTE — Progress Notes (Signed)
Primary nurse was notified by lab that pt was PCR  positive for MRSA and staph. Primary nurse notified Dr. Adonis Huguenin of positive results. Primary nurse placed postive standing orders for pt. Pt was informed of the test results and educated on MRSA and the treatment. Primary nurse to continue to monitor.

## 2017-04-27 NOTE — Anesthesia Post-op Follow-up Note (Signed)
Anesthesia QCDR form completed.        

## 2017-04-27 NOTE — Anesthesia Procedure Notes (Signed)
Procedure Name: Intubation Date/Time: 04/27/2017 12:49 PM Performed by: Darlyne Russian Pre-anesthesia Checklist: Patient identified, Emergency Drugs available, Suction available, Patient being monitored and Timeout performed Patient Re-evaluated:Patient Re-evaluated prior to induction Oxygen Delivery Method: Circle system utilized Preoxygenation: Pre-oxygenation with 100% oxygen Induction Type: IV induction Ventilation: Oral airway inserted - appropriate to patient size and Mask ventilation with difficulty Laryngoscope Size: Mac and 4 Grade View: Grade IV Tube type: Oral Tube size: 7.5 mm Number of attempts: 1 Placement Confirmation: ETT inserted through vocal cords under direct vision,  positive ETCO2 and breath sounds checked- equal and bilateral Secured at: 22 cm Tube secured with: Tape Dental Injury: Teeth and Oropharynx as per pre-operative assessment

## 2017-04-27 NOTE — Progress Notes (Signed)
Pharmacy Antibiotic Note  Jeremy Long is a 52 y.o. male admitted on 04/26/2017 with wound infection/back abscess.  Pharmacy has been consulted for Vancomycin dosing.  Plan: Will give Vancomycin 2 g IV x1. Will then start Vancomycin 1250 mg IV q12 hours starting @ 0000 on 10/13. Will order Trough level prior to the 4th dose of the regimen. Trough level ordered for 10/14 @ 1130.   Height: 5\' 6"  (167.6 cm) Weight: 209 lb 4.8 oz (94.9 kg) IBW/kg (Calculated) : 63.8  Temp (24hrs), Avg:98.1 F (36.7 C), Min:97.7 F (36.5 C), Max:98.7 F (37.1 C)   Recent Labs Lab 04/26/17 0905 04/27/17 0357  WBC 14.6* 10.4  CREATININE 0.87 1.09    Estimated Creatinine Clearance: 84.5 mL/min (by C-G formula based on SCr of 1.09 mg/dL).    No Known Allergies  Antimicrobials this admission: Vancomycin 10/12 >>    >>   Dose adjustments this admission:  Microbiology results:  BCx:   UCx:    Sputum:    MRSA PCR:   Thank you for allowing pharmacy to be a part of this patient's care.  Jeremy Long D 04/27/2017 10:54 AM

## 2017-04-27 NOTE — Progress Notes (Signed)
Pharmacy Antibiotic Note  Jeremy Long is a 51 y.o. male admitted on 04/26/2017 with wound infection/back abscess.  Pharmacy has been consulted for Vancomycin dosing.  Plan: Will give Vancomycin 2 g IV x1. Will then start Vancomycin 1250 mg IV q12 hours starting @ 0000 on 10/13. Will order Trough level prior to the 4th dose of the regimen. Trough level ordered for 10/14 @ 1130.   10/12:  Vanc delayed due to pt in surgery.  Will retime Vanc 2 gm IV X 1 for 10/12 @ 1500 and Vanc 1250 mg IV Q12H to start on 10/13 @ 0300.  Will draw trough on 10/14 @ 14:30.   Height: 5\' 6"  (167.6 cm) Weight: 209 lb 4.8 oz (94.9 kg) IBW/kg (Calculated) : 63.8  Temp (24hrs), Avg:97.8 F (36.6 C), Min:96.8 F (36 C), Max:98.7 F (37.1 C)   Recent Labs Lab 04/26/17 0905 04/27/17 0357  WBC 14.6* 10.4  CREATININE 0.87 1.09    Estimated Creatinine Clearance: 84.5 mL/min (by C-G formula based on SCr of 1.09 mg/dL).    No Known Allergies  Antimicrobials this admission: Vancomycin 10/12 >>    >>   Dose adjustments this admission:  Microbiology results:  BCx:   UCx:    Sputum:    MRSA PCR:   Thank you for allowing pharmacy to be a part of this patient's care.  Nyal Schachter D 04/27/2017 2:50 PM

## 2017-04-27 NOTE — Transfer of Care (Signed)
Immediate Anesthesia Transfer of Care Note  Patient: Jeremy Long  Procedure(s) Performed: INCISION AND DRAINAGE POSTERIOR NECK ABSCESS (Left )  Patient Location: PACU  Anesthesia Type:General  Level of Consciousness: awake, alert , oriented and patient cooperative  Airway & Oxygen Therapy: Patient Spontanous Breathing and Patient connected to face mask oxygen  Post-op Assessment: Report given to RN and Post -op Vital signs reviewed and stable  Post vital signs: Reviewed and stable  Last Vitals:  Vitals:   04/27/17 1218 04/27/17 1316  BP: 94/73 125/82  Pulse: 92 74  Resp: 18 14  Temp: (!) 36 C 36.5 C  SpO2: 98% 100%    Last Pain:  Vitals:   04/27/17 1316  TempSrc: Temporal  PainSc: 10-Worst pain ever      Patients Stated Pain Goal: 2 (63/84/66 5993)  Complications: No apparent anesthesia complications

## 2017-04-27 NOTE — Op Note (Signed)
04/26/2017 - 04/27/2017  1:19 PM  PATIENT:  Jeremy Long  51 y.o. male  PRE-OPERATIVE DIAGNOSIS: neck abscess  POST-OPERATIVE DIAGNOSIS: neck abscess  PROCEDURE: ncision and drainage of neck abscess  SURGEON:  Florene Glen MD, FACS  ANESTHESIA:   Gen. With endotracheal tube   Details of Procedure: this a patient with a neck abscess is been previously I&D. It has recurred. He requires further incision and drainage in the operating room. Preoperatively discussed rationale for surgery the options of observation risk bleeding infection recurrence and cosmetic deformity he understood and agreed to proceed.  Findings large abscess of the posterior neck area with previous incision and drainage site present and pointing site distal or caudad to that. Multiloculated.Ulcers obtained.  Description of procedure patient was discharged general anesthesia and thend into a well-padded left lateral recumbent position with a be. He was prepped draped sterile fashion a surgical pause d.  Inspection of the wound dmonstrated a large abscess with prior I&D site which was at the cephalad extent of the abscess preventing gravity drainage. A clamp was placed into this cavity and a pointing s and the area was irrigated with copious sterile saline after obtaining cultures. A Penrose drain was placed and held in with 3-0 nylon. Sterile dressing was placed.  Patient thought of this procedure well the workup occasions he was taken to recovery room in stable condition to be admitted for continued IV antibiotics.  Sponge lap needle count was correct   Florene Glen, MD FACS

## 2017-04-27 NOTE — Anesthesia Postprocedure Evaluation (Signed)
Anesthesia Post Note  Patient: Jeremy Long  Procedure(s) Performed: INCISION AND DRAINAGE POSTERIOR NECK ABSCESS (Right )  Patient location during evaluation: PACU Anesthesia Type: General Level of consciousness: awake and alert Pain management: pain level controlled Vital Signs Assessment: post-procedure vital signs reviewed and stable Respiratory status: spontaneous breathing, nonlabored ventilation, respiratory function stable and patient connected to nasal cannula oxygen Cardiovascular status: blood pressure returned to baseline and stable Postop Assessment: no apparent nausea or vomiting Anesthetic complications: no     Last Vitals:  Vitals:   04/27/17 1439 04/27/17 1508  BP: 113/83 130/81  Pulse: 65 65  Resp: 16 14  Temp: 36.6 C 36.9 C  SpO2: 98% 100%    Last Pain:  Vitals:   04/27/17 1508  TempSrc: Oral  PainSc:                  Pedro Oldenburg S

## 2017-04-27 NOTE — Progress Notes (Signed)
[place] [facility] [place] NAME: Jeremy Long    MR#:  [id number]  DATE OF BIRTH:  1966/02/05  SUBJECTIVE:  CHIEF COMPLAINT:   Chief Complaint  Patient presents with  . Abscess     Had a cellulitis and local collection on upper neck and head- I & D done by ER and was sent home with bactrim oral, but cont to have worsening swelling and pain. Plan is to do Surgical I & D today.  REVIEW OF SYSTEMS:  CONSTITUTIONAL: No fever, fatigue or weakness.  EYES: No blurred or double vision.  EARS, NOSE, AND THROAT: No tinnitus or ear pain.  RESPIRATORY: No cough, shortness of breath, wheezing or hemoptysis.  CARDIOVASCULAR: No chest pain, orthopnea, edema.  GASTROINTESTINAL: No nausea, vomiting, diarrhea or abdominal pain.  GENITOURINARY: No dysuria, hematuria.  ENDOCRINE: No polyuria, nocturia,  HEMATOLOGY: No anemia, easy bruising or bleeding SKIN: No rash or lesion. MUSCULOSKELETAL: No joint pain or arthritis.   NEUROLOGIC: No tingling, numbness, weakness.  PSYCHIATRY: No anxiety or depression.   ROS  DRUG ALLERGIES:  No Known Allergies  VITALS:  Blood pressure 102/82, pulse 69, temperature 98.5 F (36.9 C), temperature source Oral, resp. rate 20, height 5\' 6"  (1.676 m), weight 94.9 kg (209 lb 4.8 oz), SpO2 98 %.  PHYSICAL EXAMINATION:  GENERAL:  51 y.o.-year-old patient lying in the bed with no acute distress.  EYES: Pupils equal, round, reactive to light and accommodation. No scleral icterus. Extraocular muscles intact.  HEENT: Head atraumatic, normocephalic. Oropharynx and nasopharynx clear.  NECK:  Supple, no jugular venous distention. No thyroid enlargement, no tenderness. Upper neck near the hair line and lower park of back of scalp have swelling and redness and some yellowish pus from the previous cut. LUNGS: Normal breath sounds bilaterally, no wheezing, rales,rhonchi or crepitation. No use of accessory muscles of respiration.   CARDIOVASCULAR: S1, S2 normal. No murmurs, rubs, or gallops.  ABDOMEN: Soft, nontender, nondistended. Bowel sounds present. No organomegaly or mass.  EXTREMITIES: No pedal edema, cyanosis, or clubbing.  NEUROLOGIC: Cranial nerves II through XII are intact. Muscle strength 5/5 in all extremities. Sensation intact. Gait not checked.  PSYCHIATRIC: The patient is alert and oriented x 3.  SKIN: No obvious rash, lesion, or ulcer.   Physical Exam LABORATORY PANEL:   CBC  Recent Labs Lab 04/27/17 0357  WBC 10.4  HGB 15.3  HCT 45.1  PLT 284   ------------------------------------------------------------------------------------------------------------------  Chemistries   Recent Labs Lab 04/27/17 0357  NA 140  K 4.2  CL 107  CO2 26  GLUCOSE 142*  BUN 14  CREATININE 1.09  CALCIUM 8.8*   ------------------------------------------------------------------------------------------------------------------  Cardiac Enzymes No results for input(s): TROPONINI in the last 168 hours. ------------------------------------------------------------------------------------------------------------------  RADIOLOGY:  Ct Soft Tissue Neck W Contrast  Result Date: 04/26/2017 CLINICAL DATA:  Abscess on upper neck with pain. EXAM: CT NECK WITH CONTRAST TECHNIQUE: Multidetector CT imaging of the neck was performed using the standard protocol following the bolus administration of intravenous contrast. CONTRAST:  75 cc Isovue 300 intravenous COMPARISON:  None. FINDINGS: Pharynx and larynx: Negative.  No masslike or inflammatory changes. Salivary glands: Negative Thyroid: Negative Lymph nodes: Mildly enlarged suboccipital and posterior triangle lymph nodes which are presumably reactive in this setting. No suppurative changes. Vascular: Atherosclerotic calcification of the carotid bifurcations and siphons. No acute vascular finding. Limited intracranial: Negative Visualized orbits: Negative Mastoids and  visualized paranasal sinuses: Mild mucosal thickening in the left maxillary sinus which  shows sclerotic wall thickening from chronic sinusitis. Skeleton: No acute or aggressive finding. Periapical erosion around central mandibular incisors. Upper chest: Nonspecific airway thickening. Other: There is subcutaneous reticulation and fat expansion with skin thickening in the right suboccipital scalp and neck. No rim enhancing fluid collection, but the abnormality has not resolved by the level of the external occipital protuberance, which is the upper margin of the study. No bony erosion. Mild expansion and indistinctness of right splenius capitis. IMPRESSION: Cellulitis in the right occipital scalp and suboccipital neck with phlegmon but no rim enhancing abscess. Inflammatory changes extend beyond the upper slice of this study which covers the external occipital protuberance and below. Please correlate for level of suspected abscess. Mild myositis of splenius capitus. Electronically Signed   By: Monte Fantasia M.D.   On: 04/26/2017 10:47    ASSESSMENT AND PLAN:   Active Problems:   Back abscess  * Cellulitis and abscess of the neck area. Patient has failed outpatient treatment with Bactrim in spite of having I&D.   on IV cefazolin. Wound swabs have been sent from the emergency room. We will wait for cultures.   surgery for incision and drainage. Pain medications ordered as needed.   MRSA PCR is positive, added vanc. May need to follow surgical wound culture.  * Chronic low back pain. Continue home medications.  * DVT prophylaxis with Lovenox   All the records are reviewed and case discussed with Care Management/Social Workerr. Management plans discussed with the patient, family and they are in agreement.  CODE STATUS: Full.  TOTAL TIME TAKING CARE OF THIS PATIENT: 35 minutes.   POSSIBLE D/C IN 1-2 DAYS, DEPENDING ON CLINICAL CONDITION.   Vaughan Basta M.D on 04/27/2017   Between  7am to 6pm - Pager - [phone]  After 6pm go to [email or web address] - password [email or web address] [facility]  Office  [phone]  CC: Primary care physician; Rogers Blocker, MD  Note: This dictation was prepared with Dragon dictation along with smaller phrase technology. Any transcriptional errors that result from this process are unintentional.

## 2017-04-28 ENCOUNTER — Encounter: Payer: Self-pay | Admitting: Surgery

## 2017-04-28 LAB — AEROBIC CULTURE W GRAM STAIN (SUPERFICIAL SPECIMEN)

## 2017-04-28 LAB — AEROBIC CULTURE  (SUPERFICIAL SPECIMEN)

## 2017-04-28 LAB — HIV ANTIBODY (ROUTINE TESTING W REFLEX): HIV Screen 4th Generation wRfx: NONREACTIVE

## 2017-04-28 MED ORDER — SULFAMETHOXAZOLE-TRIMETHOPRIM 400-80 MG/5ML IV SOLN
300.0000 mg | Freq: Three times a day (TID) | INTRAVENOUS | Status: DC
  Administered 2017-04-28 – 2017-04-29 (×2): 300 mg via INTRAVENOUS
  Filled 2017-04-28 (×5): qty 18.8

## 2017-04-28 MED ORDER — OXYCODONE-ACETAMINOPHEN 5-325 MG PO TABS
1.0000 | ORAL_TABLET | Freq: Four times a day (QID) | ORAL | Status: DC | PRN
  Administered 2017-04-28: 1 via ORAL
  Filled 2017-04-28: qty 1

## 2017-04-28 MED ORDER — OXYCODONE HCL 5 MG PO TABS
15.0000 mg | ORAL_TABLET | Freq: Four times a day (QID) | ORAL | Status: DC
  Administered 2017-04-28 – 2017-04-29 (×4): 15 mg via ORAL
  Filled 2017-04-28 (×4): qty 3

## 2017-04-28 NOTE — Progress Notes (Signed)
[facility] [place] NAME: Jeremy Long    MR#:  [id number]  DATE OF BIRTH:  [birth year]  SUBJECTIVE:  CHIEF COMPLAINT:   Chief Complaint  Patient presents with  . Abscess   Neck abscess InD on 04/27/2017. Still has a lot of pain. Penrose drain Afebrile.  REVIEW OF SYSTEMS:  CONSTITUTIONAL: No fever, fatigue or weakness.  EYES: No blurred or double vision.  EARS, NOSE, AND THROAT: No tinnitus or ear pain.  RESPIRATORY: No cough, shortness of breath, wheezing or hemoptysis.  CARDIOVASCULAR: No chest pain, orthopnea, edema.  GASTROINTESTINAL: No nausea, vomiting, diarrhea or abdominal pain.  GENITOURINARY: No dysuria, hematuria.  ENDOCRINE: No polyuria, nocturia,  HEMATOLOGY: No anemia, easy bruising or bleeding SKIN: No rash or lesion. MUSCULOSKELETAL: No joint pain or arthritis.   NEUROLOGIC: No tingling, numbness, weakness.  PSYCHIATRY: No anxiety or depression.   ROS  DRUG ALLERGIES:  No Known Allergies  VITALS:  Blood pressure 129/82, pulse 69, temperature 98.2 F (36.8 C), temperature source Oral, resp. rate 20, height 5\' 6"  (1.676 m), weight 101.7 kg (224 lb 1.6 oz), SpO2 96 %.  PHYSICAL EXAMINATION:  GENERAL:  51 y.o.-year-old patient lying in the bed with no acute distress.  EYES: Pupils equal, round, reactive to light and accommodation. No scleral icterus. Extraocular muscles intact.  HEENT: Head atraumatic, normocephalic. Oropharynx and nasopharynx clear.  NECK:  Supple, no jugular venous distention. No thyroid enlargement, no tenderness.  LUNGS: Normal breath sounds bilaterally, no wheezing, rales,rhonchi or crepitation. No use of accessory muscles of respiration.  CARDIOVASCULAR: S1, S2 normal. No murmurs, rubs, or gallops.  ABDOMEN: Soft, nontender, nondistended. Bowel sounds present. No organomegaly or mass.  EXTREMITIES: No pedal edema, cyanosis, or clubbing.  NEUROLOGIC: Cranial nerves II through XII are  intact. Muscle strength 5/5 in all extremities. Sensation intact. Gait not checked.  PSYCHIATRIC: The patient is alert and oriented x 3.  SKIN: No obvious rash, lesion, or ulcer.   Sutures with Penrose drain and erythema and induration on posterior neck.  Physical Exam LABORATORY PANEL:   CBC  Recent Labs Lab 04/27/17 0357  WBC 10.4  HGB 15.3  HCT 45.1  PLT 284   ------------------------------------------------------------------------------------------------------------------  Chemistries   Recent Labs Lab 04/27/17 0357  NA 140  K 4.2  CL 107  CO2 26  GLUCOSE 142*  BUN 14  CREATININE 1.09  CALCIUM 8.8*   ------------------------------------------------------------------------------------------------------------------  Cardiac Enzymes No results for input(s): TROPONINI in the last 168 hours. ------------------------------------------------------------------------------------------------------------------  RADIOLOGY:  No results found.  ASSESSMENT AND PLAN:   Active Problems:   Back abscess  * Cellulitis and abscess of the neck area. Patient has failed outpatient treatment with Bactrim. Status post incision and drainage. Penrose drain in place. On IV cefazolin.  * pain management. Discussed at length with patient. Would not increase IV pain medications. Increased his home dose of oxycodone from 10 mg to 15 mg.  * Chronic low back pain. Continue home medications.  * DVT prophylaxis with Lovenox   All the records are reviewed and case discussed with Care Management/Social Workerr. Management plans discussed with the patient, family and they are in agreement.  CODE STATUS: Full.  TOTAL TIME TAKING CARE OF THIS PATIENT: 35 minutes.   POSSIBLE D/C IN 1-2 DAYS, DEPENDING ON CLINICAL CONDITION.   Hillary Bow R M.D on 04/28/2017   Between 7am to 6pm - Pager - [phone]  After 6pm go to [email or web address] - [place]  [facility]  Office  [phone]  CC: Primary care physician; Rogers Blocker, MD  Note: This dictation was prepared with Dragon dictation along with smaller phrase technology. Any transcriptional errors that result from this process are unintentional.

## 2017-04-29 MED ORDER — DOXYCYCLINE HYCLATE 100 MG PO CAPS: 100.0000 mg | ORAL_CAPSULE | Freq: Two times a day (BID) | ORAL | 0 refills | Status: DC

## 2017-04-29 MED ORDER — ALUM & MAG HYDROXIDE-SIMETH 200-200-20 MG/5ML PO SUSP
30.0000 mL | Freq: Four times a day (QID) | ORAL | Status: DC | PRN
Start: 2017-04-29 — End: 2017-04-29

## 2017-04-29 MED ORDER — OXYCODONE HCL 20 MG PO TABS: 20.0000 mg | ORAL_TABLET | Freq: Four times a day (QID) | ORAL | 0 refills | Status: DC | PRN

## 2017-04-29 NOTE — Discharge Instructions (Signed)
May shower Dry dressing over drain site daily Follow-up with Dr. Burt Knack next week for drain removal

## 2017-04-29 NOTE — Progress Notes (Signed)
Discharge order received. Patient is alert and oriented. Vital signs stable . No signs of acute distress. Discharge instructions given. Patient verbalized understanding. No other issues noted at this time.   

## 2017-04-30 ENCOUNTER — Telehealth: Payer: Self-pay | Admitting: General Practice

## 2017-04-30 NOTE — Telephone Encounter (Signed)
Patient is calling he had surgery done on 04/27/17 he had an incision and drainage of neck abscess, patient has a couple questions he is asking if he can wash his hair and can he get the area wet. Please call patient and advice.

## 2017-04-30 NOTE — Telephone Encounter (Signed)
Patient has called back and I have advised him that it is ok to take a shower and to not submerge the area in water. He was also advised to apply dry gauze over the drain. Patient understands.

## 2017-05-01 ENCOUNTER — Telehealth: Payer: Self-pay

## 2017-05-01 NOTE — Discharge Summary (Signed)
[facility] [place] NAME: Jeremy Long    MR#:  [id number]  DATE OF BIRTH:  23-Mar-1966  DATE OF ADMISSION:  04/26/2017 ADMITTING PHYSICIAN: Hillary Bow, MD  DATE OF DISCHARGE: 04/29/2017 10:57 AM  PRIMARY CARE PHYSICIAN: Rogers Blocker, MD   ADMISSION DIAGNOSIS:  Cellulitis of neck [A83.419] Failure of outpatient treatment [Z78.9]  DISCHARGE DIAGNOSIS:  Active Problems:   Back abscess   SECONDARY DIAGNOSIS:   Past Medical History:  Diagnosis Date  . Chronic back pain   . Tobacco use      ADMITTING HISTORY  HISTORY OF PRESENT ILLNESS:  Jeremy Long  is a 51 y.o. male with a known history of Chronic low back pain on high-dose narcotics, tobacco abuse presents to the emergency room due to worsening cellulitis/abscess of upper back and lower neck. Patient was seen here in the emergency room on 04/23/2017 and sent home on Bactrim. I&D was done. Patient's swelling has worsened and he has noticed pus draining and return to emergency room. Here CT scan of the neck was done which shows some fluid collection likely phlegmon. Patient has significant redness surrounding the area and pain. Is being admitted due to failed outpatient treatment. He does go to pain clinic and it gets narcotics from there. On anxiety medication with Ativan.   HOSPITAL COURSE:   * Cellulitis and abscess of the lower neck area. Patient had incision and drainage with Penrose drain in place. Dry dressing. Discharge decreased significantly. His swelling has gone down edema improving. Afebrile. Normal WBC. Discussed case with Dr. Rosana Hoes who was on-call for surgery. Patient is being discharged home on doxycycline for 10 days to follow up with Dr. Burt Knack of surgery the office when the Penrose drain will be removed.  * Chronic low back pain. Continue home medications.  CONSULTS OBTAINED:  Treatment Team:  Florene Glen, MD  DRUG ALLERGIES:  No Known  Allergies  DISCHARGE MEDICATIONS:   Discharge Medication List as of 04/29/2017  9:21 AM    START taking these medications   Details  doxycycline (VIBRAMYCIN) 100 MG capsule Take 1 capsule (100 mg total) by mouth 2 (two) times daily., Starting Sun 04/29/2017, Print    !! Oxycodone HCl 20 MG TABS Take 1 tablet (20 mg total) by mouth every 6 (six) hours as needed for severe pain., Starting Sun 04/29/2017, Until Mon 04/29/2018, Print     !! - Potential duplicate medications found. Please discuss with provider.    CONTINUE these medications which have NOT CHANGED   Details  albuterol (PROVENTIL HFA;VENTOLIN HFA) 108 (90 Base) MCG/ACT inhaler Inhale 2 puffs into the lungs every 6 (six) hours as needed for wheezing or shortness of breath., Starting Thu 01/18/2017, Print    LORazepam (ATIVAN) 1 MG tablet Take 1 mg by mouth 3 (three) times daily as needed., Starting Fri 04/06/2017, Historical Med    !! Oxycodone HCl 10 MG TABS Take 10 mg by mouth 4 (four) times daily., Historical Med     !! - Potential duplicate medications found. Please discuss with provider.    STOP taking these medications     mupirocin ointment (BACTROBAN) 2 %      sulfamethoxazole-trimethoprim (BACTRIM DS) 800-160 MG tablet      clindamycin (CLEOCIN) 150 MG capsule      cyclobenzaprine (FLEXERIL) 10 MG tablet      HYDROcodone-acetaminophen (NORCO/VICODIN) 5-325 MG tablet      lidocaine (XYLOCAINE) 2 % solution  meloxicam (MOBIC) 15 MG tablet      oxyCODONE-acetaminophen (ROXICET) 5-325 MG tablet         Today   VITAL SIGNS:  Blood pressure 110/86, pulse 71, temperature 97.7 F (36.5 C), temperature source Oral, resp. rate 16, height 5\' 6"  (1.676 m), weight 98.4 kg (216 lb 14.4 oz), SpO2 99 %.  I/O:  No intake or output data in the 24 hours ending 05/01/17 1312  PHYSICAL EXAMINATION:  Physical Exam  GENERAL:  51 y.o.-year-old patient lying in the bed with no acute distress.  LUNGS: Normal breath  sounds bilaterally, no wheezing, rales,rhonchi or crepitation. No use of accessory muscles of respiration.  CARDIOVASCULAR: S1, S2 normal. No murmurs, rubs, or gallops.  ABDOMEN: Soft, non-tender, non-distended. Bowel sounds present. No organomegaly or mass.  NEUROLOGIC: Moves all 4 extremities. PSYCHIATRIC: The patient is alert and oriented x 3.  SKIN: Posterior neck Swelling, sutures and Penrose drain.  DATA REVIEW:   CBC  Recent Labs Lab 04/27/17 0357  WBC 10.4  HGB 15.3  HCT 45.1  PLT 284    Chemistries   Recent Labs Lab 04/27/17 0357  NA 140  K 4.2  CL 107  CO2 26  GLUCOSE 142*  BUN 14  CREATININE 1.09  CALCIUM 8.8*    Cardiac Enzymes No results for input(s): TROPONINI in the last 168 hours.  Microbiology Results  Results for orders placed or performed during the hospital encounter of 04/26/17  Wound or Superficial Culture     Status: None   Collection Time: 04/26/17 11:20 AM  Result Value Ref Range Status   Specimen Description ABSCESS NECK  Final   Special Requests NONE  Final   Gram Stain   Final    MODERATE WBC PRESENT,BOTH PMN AND MONONUCLEAR FEW GRAM POSITIVE COCCI IN PAIRS Performed at [facility], [place] [address], [place], [place] [address]    Culture   Final    ABUNDANT METHICILLIN RESISTANT STAPHYLOCOCCUS AUREUS   Report Status 04/28/2017 FINAL  Final   Organism ID, Bacteria METHICILLIN RESISTANT STAPHYLOCOCCUS AUREUS  Final      Susceptibility   Methicillin resistant staphylococcus aureus - MIC*    CIPROFLOXACIN >=8 RESISTANT Resistant     ERYTHROMYCIN >=8 RESISTANT Resistant     GENTAMICIN <=0.5 SENSITIVE Sensitive     OXACILLIN >=4 RESISTANT Resistant     TETRACYCLINE <=1 SENSITIVE Sensitive     VANCOMYCIN <=0.5 SENSITIVE Sensitive     TRIMETH/SULFA <=10 SENSITIVE Sensitive     CLINDAMYCIN >=8 RESISTANT Resistant     RIFAMPIN <=0.5 SENSITIVE Sensitive     Inducible Clindamycin NEGATIVE Sensitive     * ABUNDANT METHICILLIN  RESISTANT STAPHYLOCOCCUS AUREUS  Surgical pcr screen     Status: Abnormal   Collection Time: 04/27/17  4:20 AM  Result Value Ref Range Status   MRSA, PCR POSITIVE (A) NEGATIVE Final    Comment: RESULT CALLED TO, READ BACK BY AND VERIFIED WITH: STEVEN SYKES AT 0617 ON 04/27/17 [place].    Staphylococcus aureus POSITIVE (A) NEGATIVE Final    Comment: (NOTE) The Xpert SA Assay (FDA approved for NASAL specimens in patients 13 years of age and older), is one component of a comprehensive surveillance program. It is not intended to diagnose infection nor to guide or monitor treatment.   Aerobic/Anaerobic Culture (surgical/deep wound)     Status: None (Preliminary result)   Collection Time: 04/27/17  1:10 PM  Result Value Ref Range Status   Specimen Description ABSCESS RIGHT  NECK  Final   Special Requests NONE  Final   Gram Stain   Final    MODERATE WBC PRESENT, PREDOMINANTLY PMN MODERATE GRAM POSITIVE COCCI Performed at [facility], [place] [address] [place], [place] [address]    Culture   Final    MODERATE METHICILLIN RESISTANT STAPHYLOCOCCUS AUREUS NO ANAEROBES ISOLATED; CULTURE IN PROGRESS FOR 5 DAYS    Report Status PENDING  Incomplete   Organism ID, Bacteria METHICILLIN RESISTANT STAPHYLOCOCCUS AUREUS  Final      Susceptibility   Methicillin resistant staphylococcus aureus - MIC*    CIPROFLOXACIN >=8 RESISTANT Resistant     ERYTHROMYCIN >=8 RESISTANT Resistant     GENTAMICIN <=0.5 SENSITIVE Sensitive     OXACILLIN >=4 RESISTANT Resistant     TETRACYCLINE <=1 SENSITIVE Sensitive     VANCOMYCIN 1 SENSITIVE Sensitive     TRIMETH/SULFA <=10 SENSITIVE Sensitive     CLINDAMYCIN >=8 RESISTANT Resistant     RIFAMPIN <=0.5 SENSITIVE Sensitive     Inducible Clindamycin NEGATIVE Sensitive     * MODERATE METHICILLIN RESISTANT STAPHYLOCOCCUS AUREUS    RADIOLOGY:  No results found.  Follow up with PCP in 1 week.  Management plans discussed with the patient, family and they are in  agreement.  CODE STATUS:  Code Status History    Date Active Date Inactive Code Status Order ID Comments User Context   04/26/2017 12:27 PM 04/29/2017  2:02 PM Full Code [id number]  Hillary Bow, MD ED      TOTAL TIME TAKING CARE OF THIS PATIENT ON DAY OF DISCHARGE: more than 30 minutes.   Hillary Bow R M.D on 05/01/2017 at 1:12 PM  Between 7am to 6pm - Pager - [phone]  After 6pm go to [email or web address] - password [email or web address]  Office  [phone]  CC: Primary care physician; Rogers Blocker, MD  Note: This dictation was prepared with Dragon dictation along with smaller phrase technology. Any transcriptional errors that result from this process are unintentional.

## 2017-05-01 NOTE — Telephone Encounter (Signed)
Post-op call made to patient at this time. Unable to leave a message due to patient not having a voicemail box setup yet.

## 2017-05-03 ENCOUNTER — Encounter: Payer: Self-pay | Admitting: General Surgery

## 2017-05-03 ENCOUNTER — Ambulatory Visit (INDEPENDENT_AMBULATORY_CARE_PROVIDER_SITE_OTHER): Payer: Self-pay | Admitting: General Surgery

## 2017-05-03 VITALS — BP 151/91 | HR 71 | Temp 98.7°F | Ht 66.0 in | Wt 213.6 lb

## 2017-05-03 DIAGNOSIS — Z4889 Encounter for other specified surgical aftercare: Secondary | ICD-10-CM

## 2017-05-03 LAB — AEROBIC/ANAEROBIC CULTURE W GRAM STAIN (SURGICAL/DEEP WOUND)

## 2017-05-03 LAB — AEROBIC/ANAEROBIC CULTURE (SURGICAL/DEEP WOUND)
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
## 2017-05-03 NOTE — Patient Instructions (Addendum)
We have removed your drain today. You may shower. Wash this area daily with soap and water. Keep clean and dry. You will only need to keep a dressing on this if it is draining or if you are out in public. Continue your antibiotics.  If you have any increase in redness, fever, or concerns with this area at all; please call our office immediately and speak with a nurse.  Please follow-up in 1 week. Your appointment information is below.   Skin Abscess A skin abscess is an infected area on or under your skin that contains a collection of pus and other material. An abscess may also be called a furuncle, carbuncle, or boil. An abscess can occur in or on almost any part of your body. Some abscesses break open (rupture) on their own. Most continue to get worse unless they are treated. The infection can spread deeper into the body and eventually into your blood, which can make you feel ill. Treatment usually involves draining the abscess. What are the causes? An abscess occurs when germs, often bacteria, pass through your skin and cause an infection. This may be caused by:  A scrape or cut on your skin.  A puncture wound through your skin, including a needle injection.  Blocked oil or sweat glands.  Blocked and infected hair follicles.  A cyst that forms beneath your skin (sebaceous cyst) and becomes infected.  What increases the risk? This condition is more likely to develop in people who:  Have a weak body defense system (immune system).  Have diabetes.  Have dry and irritated skin.  Get frequent injections or use illegal IV drugs.  Have a foreign body in a wound, such as a splinter.  Have problems with their lymph system or veins.  What are the signs or symptoms? An abscess may start as a painful, firm bump under the skin. Over time, the abscess may get larger or become softer. Pus may appear at the top of the abscess, causing pressure and pain. It may eventually break through the  skin and drain. Other symptoms include:  Redness.  Warmth.  Swelling.  Tenderness.  A sore on the skin.  How is this diagnosed? This condition is diagnosed based on your medical history and a physical exam. A sample of pus may be taken from the abscess to find out what is causing the infection and what antibiotics can be used to treat it. You also may have:  Blood tests to look for signs of infection or spread of an infection to your blood.  Imaging studies such as ultrasound, CT scan, or MRI if the abscess is deep.  How is this treated? Small abscesses that drain on their own may not need treatment. Treatment for an abscess that does not rupture on its own may include:  Warm compresses applied to the area several times per day.  Incision and drainage. Your health care provider will make an incision to open the abscess and will remove pus and any foreign body or dead tissue. The incision area may be packed with gauze to keep it open for a few days while it heals.  Antibiotic medicines to treat infection. For a severe abscess, you may first get antibiotics through an IV and then change to oral antibiotics.  Follow these instructions at home: Abscess Care  If you have an abscess that has not drained, place a warm, clean, wet washcloth over the abscess several times a day. Do this as told by  your health care provider.  Follow instructions from your health care provider about how to take care of your abscess. Make sure you: ? Cover the abscess with a bandage (dressing). ? Change your dressing or gauze as told by your health care provider. ? Wash your hands with soap and water before you change the dressing or gauze. If soap and water are not available, use hand sanitizer.  Check your abscess every day for signs of a worsening infection. Check for: ? More redness, swelling, or pain. ? More fluid or blood. ? Warmth. ? More pus or a bad smell. Medicines  Take over-the-counter  and prescription medicines only as told by your health care provider.  If you were prescribed an antibiotic medicine, take it as told by your health care provider. Do not stop taking the antibiotic even if you start to feel better. General instructions  To avoid spreading the infection: ? Do not share personal care items, towels, or hot tubs with others. ? Avoid making skin contact with other people.  Keep all follow-up visits as told by your health care provider. This is important. Contact a health care provider if:  You have more redness, swelling, or pain around your abscess.  You have more fluid or blood coming from your abscess.  Your abscess feels warm to the touch.  You have more pus or a bad smell coming from your abscess.  You have a fever.  You have muscle aches.  You have chills or a general ill feeling. Get help right away if:  You have severe pain.  You see red streaks on your skin spreading away from the abscess. This information is not intended to replace advice given to you by your health care provider. Make sure you discuss any questions you have with your health care provider. Document Released: 04/12/2005 Document Revised: 02/27/2016 Document Reviewed: 05/12/2015 Elsevier Interactive Patient Education  [employer].

## 2017-05-09 ENCOUNTER — Ambulatory Visit: Payer: Self-pay | Admitting: Surgery

## 2017-05-09 ENCOUNTER — Other Ambulatory Visit: Payer: Self-pay

## 2017-05-10 ENCOUNTER — Ambulatory Visit: Payer: Self-pay | Admitting: Surgery

## 2017-05-11 ENCOUNTER — Telehealth: Payer: Self-pay | Admitting: Surgery

## 2017-05-11 NOTE — Telephone Encounter (Signed)
Left a message for the patient to call the office to r/s his no showed appointment with Dr. Burt Knack on 05/10/2017. Please r/s patients appointment when patient calls back.

## 2017-05-16 ENCOUNTER — Telehealth: Payer: Self-pay | Admitting: Surgery
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
## 2017-05-17 ENCOUNTER — Encounter: Payer: Self-pay | Admitting: Surgery

## 2017-05-17 ENCOUNTER — Ambulatory Visit (INDEPENDENT_AMBULATORY_CARE_PROVIDER_SITE_OTHER): Payer: Self-pay | Admitting: Surgery

## 2017-05-17 VITALS — BP 147/85 | HR 68 | Temp 97.9°F | Ht 66.0 in | Wt 211.2 lb

## 2017-05-17 DIAGNOSIS — L02212 Cutaneous abscess of back [any part, except buttock]: Secondary | ICD-10-CM
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
## 2017-05-17 NOTE — Patient Instructions (Signed)
Please call our office if you have questions or concerns.   

## 2017-07-05 ENCOUNTER — Encounter: Payer: Self-pay | Admitting: Surgery

## 2017-10-17 ENCOUNTER — Emergency Department: Payer: Self-pay

## 2017-10-17 ENCOUNTER — Encounter: Payer: Self-pay | Admitting: Emergency Medicine

## 2017-10-17 ENCOUNTER — Emergency Department
Admission: EM | Admit: 2017-10-17 | Discharge: 2017-10-17 | Disposition: A | Discharge status: Home / Self Care | Payer: Self-pay | Attending: Emergency Medicine | Admitting: Emergency Medicine

## 2017-10-17 DIAGNOSIS — R109 Unspecified abdominal pain: Secondary | ICD-10-CM

## 2017-10-17 DIAGNOSIS — Y999 Unspecified external cause status: Secondary | ICD-10-CM | POA: Insufficient documentation

## 2017-10-17 DIAGNOSIS — S20221A Contusion of right back wall of thorax, initial encounter: Secondary | ICD-10-CM | POA: Insufficient documentation

## 2017-10-17 DIAGNOSIS — F17210 Nicotine dependence, cigarettes, uncomplicated: Secondary | ICD-10-CM | POA: Insufficient documentation

## 2017-10-17 DIAGNOSIS — Z79899 Other long term (current) drug therapy: Secondary | ICD-10-CM | POA: Insufficient documentation

## 2017-10-17 DIAGNOSIS — Y929 Unspecified place or not applicable: Secondary | ICD-10-CM | POA: Insufficient documentation

## 2017-10-17 DIAGNOSIS — M545 Low back pain, unspecified: Secondary | ICD-10-CM

## 2017-10-17 DIAGNOSIS — G8929 Other chronic pain: Secondary | ICD-10-CM | POA: Insufficient documentation

## 2017-10-17 DIAGNOSIS — W010XXA Fall on same level from slipping, tripping and stumbling without subsequent striking against object, initial encounter: Secondary | ICD-10-CM | POA: Insufficient documentation

## 2017-10-17 DIAGNOSIS — Y9389 Activity, other specified: Secondary | ICD-10-CM | POA: Insufficient documentation

## 2017-10-17 DIAGNOSIS — R1011 Right upper quadrant pain: Secondary | ICD-10-CM | POA: Insufficient documentation

## 2017-10-17 LAB — URINE DRUG SCREEN, QUALITATIVE (ARMC ONLY)
Amphetamines, Ur Screen: NOT DETECTED
Barbiturates, Ur Screen: NOT DETECTED
Benzodiazepine, Ur Scrn: POSITIVE — AB
COCAINE METABOLITE, UR ~~LOC~~: NOT DETECTED
Cannabinoid 50 Ng, Ur ~~LOC~~: NOT DETECTED
MDMA (ECSTASY) UR SCREEN: NOT DETECTED
METHADONE SCREEN, URINE: NOT DETECTED
Opiate, Ur Screen: NOT DETECTED
Phencyclidine (PCP) Ur S: NOT DETECTED
TRICYCLIC, UR SCREEN: NOT DETECTED

## 2017-10-17 LAB — URINALYSIS, COMPLETE (UACMP) WITH MICROSCOPIC
BACTERIA UA: NONE SEEN
Bilirubin Urine: NEGATIVE
Glucose, UA: NEGATIVE mg/dL
Hgb urine dipstick: NEGATIVE
Ketones, ur: NEGATIVE mg/dL
Leukocytes, UA: NEGATIVE
Nitrite: NEGATIVE
Protein, ur: NEGATIVE mg/dL
SPECIFIC GRAVITY, URINE: 1.013 (ref 1.005–1.030)
pH: 5 (ref 5.0–8.0)

## 2017-10-17 MED ORDER — DEXAMETHASONE SODIUM PHOSPHATE 10 MG/ML IJ SOLN
10.0000 mg | Freq: Once | INTRAMUSCULAR | Status: AC
  Administered 2017-10-17: 10 mg via INTRAMUSCULAR
  Filled 2017-10-17: qty 1

## 2017-10-17 MED ORDER — CARISOPRODOL 350 MG PO TABS: 350.0000 mg | ORAL_TABLET | Freq: Three times a day (TID) | ORAL | 0 refills | Status: AC | PRN

## 2017-10-17 MED ORDER — PREDNISONE 10 MG PO TABS: 10.0000 mg | ORAL_TABLET | Freq: Two times a day (BID) | ORAL | 0 refills | Status: DC

## 2017-10-17 MED ORDER — BACLOFEN 10 MG PO TABS: 10.0000 mg | ORAL_TABLET | Freq: Three times a day (TID) | ORAL | 0 refills | Status: DC
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
## 2017-10-17 NOTE — ED Triage Notes (Signed)
Pt reports hx of back pain. Pt reports fell on [place] and has a bruise on his side. Pt reports takes 10mg  fentanyl and oxycodone for his back pain. Pt reports meds are not working for his pain. Pt denies blood in urine but reports some dysuria.

## 2017-10-17 NOTE — Discharge Instructions (Signed)
Your exam and labs are consistent with a back contusion and bruising. You have small stones in the kidney. Take your home medicines as prescribed. Follow-up with Dr. Marlou Sa as scheduled. You will have to dose your meds judiciously until your appointment. We are unable to provider narcotic medicine prescriptions during this visit. Take the prescribed steroid and muscle relaxant as directed. See our Pain Medicine Policy below.  Emergency care providers appreciate that many patients coming to [place] are in severe pain and we wish to address their pain in the safest, most responsible manner.  It is important to recognize, however, that the proper treatment of chronic pain differs from that of the pain of injuries and acute illnesses.  Our goal is to provider quality, safe, personalized care and we thank you for giving [place] the opportunity to serve you.  The use of narcotics and related agents for chronic pain syndromes may lead to additional physical and psychological problems.  Nearly as many people die from prescription narcotics each year as die from car crashes.  Additionally, this risk is increased if such prescriptions are obtained from a variety of sources.  Therefore, only your primary care physician or a pain management specialist is able to safely treat such syndromes with narcotic medications long-term.  Documentation revealing such prescriptions have been sought from multiple sources may prohibit [place] from providing a refill or different narcotic medication.  Your name may be checked first through the [place].  This database is a record of controlled substance medication prescriptions that the patient has received.  This has been established by [facility] in an effort to eliminate the dangerous, and often life-threatening, practice of obtaining multiple prescriptions from different medical providers.  If you have a chronic pain syndrome (i.e. chronic headaches,  recurrent back or neck pain, dental pain, abdominal or pelvic pain without a specific diagnosis, or neuropathic pain such as fibromyalgia) or recurrent visits for the same condition without an acute diagnosis, you may be treated with non-narcotics and other non-addictive medicines.  Allergic reactions or negative side effects that may be reported by a patient to such medications will not typically lead to the use of a narcotic analgesic or other controlled substance as an alternative.  Patients managing chronic pain with a personal physician should have provisions in place for breakthrough pain.  If you are in crisis, you should call your physician.  If your physician directs you to the emergency department, please have the doctor call and speak to our attending physician concerning your care.  When patients come to the Emergency Department (ED) with acute medical conditions in which the ED physician feels it is appropriate to prescribe narcotic or sedating pain medication, the physician will prescribe these in very limited quantities.  The amount of these medications will last only until you can see your primary care physician in his/her office.  Any patient who returns to the ED seeking refills should expect only non-narcotic pain medications.  In the event an acute medical condition exists and the emergency physician feels it is necessary that the patient be given a narcotic or sedating medication, a responsible adult driver should be present in the room prior to the medication being given by the nurse.  Prescriptions for narcotic or sedating medications that have been lost, stolen, or expired will NOT be refilled in the ED.  Patients who have chronic pain may receive non-narcotic prescriptions until seen by their primary care physician.  It is  every patient's personal responsibility to maintain active prescriptions with his or her primary care physician or specialist.

## 2017-11-22 ENCOUNTER — Emergency Department: Payer: Self-pay

## 2017-11-22 ENCOUNTER — Other Ambulatory Visit: Payer: Self-pay

## 2017-11-22 ENCOUNTER — Encounter: Payer: Self-pay | Admitting: Emergency Medicine

## 2017-11-22 ENCOUNTER — Emergency Department
Admission: EM | Admit: 2017-11-22 | Discharge: 2017-11-22 | Disposition: A | Discharge status: Home / Self Care | Payer: Self-pay | Attending: Emergency Medicine | Admitting: Emergency Medicine

## 2017-11-22 DIAGNOSIS — F17210 Nicotine dependence, cigarettes, uncomplicated: Secondary | ICD-10-CM | POA: Insufficient documentation

## 2017-11-22 DIAGNOSIS — R42 Dizziness and giddiness: Secondary | ICD-10-CM | POA: Insufficient documentation

## 2017-11-22 DIAGNOSIS — R079 Chest pain, unspecified: Secondary | ICD-10-CM | POA: Insufficient documentation

## 2017-11-22 DIAGNOSIS — Z720 Tobacco use: Secondary | ICD-10-CM

## 2017-11-22 DIAGNOSIS — Z79899 Other long term (current) drug therapy: Secondary | ICD-10-CM | POA: Insufficient documentation

## 2017-11-22 DIAGNOSIS — R0602 Shortness of breath: Secondary | ICD-10-CM | POA: Insufficient documentation

## 2017-11-22 LAB — CBC
HEMATOCRIT: 45.8 % (ref 40.0–52.0)
Hemoglobin: 15.7 g/dL (ref 13.0–18.0)
MCH: 32.2 pg (ref 26.0–34.0)
MCHC: 34.2 g/dL (ref 32.0–36.0)
MCV: 93.9 fL (ref 80.0–100.0)
Platelets: 216 10*3/uL (ref 150–440)
RBC: 4.87 MIL/uL (ref 4.40–5.90)
RDW: 14.3 % (ref 11.5–14.5)
WBC: 11.8 10*3/uL — AB (ref 3.8–10.6)

## 2017-11-22 LAB — BASIC METABOLIC PANEL
ANION GAP: 7 (ref 5–15)
BUN: 22 mg/dL — AB (ref 6–20)
CHLORIDE: 101 mmol/L (ref 101–111)
CO2: 26 mmol/L (ref 22–32)
Calcium: 9.1 mg/dL (ref 8.9–10.3)
Creatinine, Ser: 1 mg/dL (ref 0.61–1.24)
GFR calc Af Amer: >60 mL/min (ref >60)
Glucose, Bld: 126 mg/dL — ABNORMAL HIGH (ref 65–99)
POTASSIUM: 4.9 mmol/L (ref 3.5–5.1)
SODIUM: 134 mmol/L — AB (ref 135–145)

## 2017-11-22 LAB — TROPONIN I: Troponin I: <0.03 ng/mL (ref <0.03)

## 2017-11-22 MED ORDER — IOPAMIDOL (ISOVUE-370) INJECTION 76%
75.0000 mL | Freq: Once | INTRAVENOUS | Status: AC | PRN
  Administered 2017-11-22: 75 mL via INTRAVENOUS

## 2017-11-22 MED ORDER — IPRATROPIUM-ALBUTEROL 0.5-2.5 (3) MG/3ML IN SOLN
3.0000 mL | Freq: Once | RESPIRATORY_TRACT | Status: AC
  Administered 2017-11-22: 3 mL via RESPIRATORY_TRACT
  Filled 2017-11-22: qty 3

## 2017-11-22 MED ORDER — ALBUTEROL SULFATE (2.5 MG/3ML) 0.083% IN NEBU
5.0000 mg | INHALATION_SOLUTION | Freq: Once | RESPIRATORY_TRACT | Status: AC
  Administered 2017-11-22: 5 mg via RESPIRATORY_TRACT

## 2017-11-22 MED ORDER — ALBUTEROL SULFATE (2.5 MG/3ML) 0.083% IN NEBU
INHALATION_SOLUTION | RESPIRATORY_TRACT | Status: AC
  Filled 2017-11-22: qty 6

## 2017-11-22 MED ORDER — ASPIRIN 81 MG PO CHEW
324.0000 mg | CHEWABLE_TABLET | Freq: Once | ORAL | Status: AC
  Administered 2017-11-22: 324 mg via ORAL
  Filled 2017-11-22: qty 4

## 2017-11-22 MED ORDER — ALBUTEROL SULFATE HFA 108 (90 BASE) MCG/ACT IN AERS: 2.0000 | INHALATION_SPRAY | Freq: Four times a day (QID) | RESPIRATORY_TRACT | 0 refills | Status: DC | PRN

## 2017-11-22 NOTE — Discharge Instructions (Signed)
Please make an appointment with your primary care physician and with the heart doctor, Dr. Rockey Situ, for further evaluation of your symptoms.  Return to the emergency department if you develop severe pain, lightheadedness or fainting, shortness of breath, palpitations, or any other symptoms concerning to you.
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
## 2017-11-22 NOTE — ED Triage Notes (Signed)
Pt c/o intermittent chest heaviness for 3 days with [facility]. Pain to central chest.  Will also have intermittent dizziness.  Unlabored currently. VSS. Is a smoker.  Has had a dry cough.

## 2017-11-22 NOTE — ED Notes (Signed)
Pt feels much better after breathing tx.

## 2017-11-22 NOTE — ED Notes (Signed)
Patient transported to CT 

## 2017-11-22 NOTE — ED Triage Notes (Signed)
Pt c/o SHOB. Diminished breath sounds bilateral.  Will order nebulizer for relief

## 2018-01-07 ENCOUNTER — Other Ambulatory Visit: Payer: Self-pay

## 2018-01-07 ENCOUNTER — Emergency Department
Admission: EM | Admit: 2018-01-07 | Discharge: 2018-01-07 | Disposition: A | Discharge status: Home / Self Care | Payer: Self-pay | Attending: Emergency Medicine | Admitting: Emergency Medicine

## 2018-01-07 ENCOUNTER — Emergency Department: Payer: Self-pay

## 2018-01-07 ENCOUNTER — Encounter: Payer: Self-pay | Admitting: Emergency Medicine

## 2018-01-07 DIAGNOSIS — J449 Chronic obstructive pulmonary disease, unspecified: Secondary | ICD-10-CM | POA: Insufficient documentation

## 2018-01-07 DIAGNOSIS — F17210 Nicotine dependence, cigarettes, uncomplicated: Secondary | ICD-10-CM | POA: Insufficient documentation

## 2018-01-07 DIAGNOSIS — Y92008 Other place in unspecified non-institutional (private) residence as the place of occurrence of the external cause: Secondary | ICD-10-CM | POA: Insufficient documentation

## 2018-01-07 DIAGNOSIS — T148XXA Other injury of unspecified body region, initial encounter: Secondary | ICD-10-CM

## 2018-01-07 DIAGNOSIS — Z23 Encounter for immunization: Secondary | ICD-10-CM | POA: Insufficient documentation

## 2018-01-07 DIAGNOSIS — S39012A Strain of muscle, fascia and tendon of lower back, initial encounter: Secondary | ICD-10-CM | POA: Insufficient documentation

## 2018-01-07 DIAGNOSIS — Y93H9 Activity, other involving exterior property and land maintenance, building and construction: Secondary | ICD-10-CM | POA: Insufficient documentation

## 2018-01-07 DIAGNOSIS — G8929 Other chronic pain: Secondary | ICD-10-CM | POA: Insufficient documentation

## 2018-01-07 DIAGNOSIS — W11XXXA Fall on and from ladder, initial encounter: Secondary | ICD-10-CM | POA: Insufficient documentation

## 2018-01-07 DIAGNOSIS — S90811A Abrasion, right foot, initial encounter: Secondary | ICD-10-CM | POA: Insufficient documentation

## 2018-01-07 DIAGNOSIS — Y999 Unspecified external cause status: Secondary | ICD-10-CM | POA: Insufficient documentation

## 2018-01-07 DIAGNOSIS — W19XXXA Unspecified fall, initial encounter: Secondary | ICD-10-CM

## 2018-01-07 HISTORY — DX: Chronic obstructive pulmonary disease, unspecified: J44.9

## 2018-01-07 MED ORDER — IBUPROFEN 800 MG PO TABS: 800.0000 mg | ORAL_TABLET | Freq: Three times a day (TID) | ORAL | 0 refills | Status: DC | PRN

## 2018-01-07 MED ORDER — HYDROCODONE-ACETAMINOPHEN 5-325 MG PO TABS
2.0000 | ORAL_TABLET | Freq: Once | ORAL | Status: AC
  Administered 2018-01-07: 2 via ORAL
  Filled 2018-01-07: qty 2

## 2018-01-07 MED ORDER — CYCLOBENZAPRINE HCL 10 MG PO TABS: 10.0000 mg | ORAL_TABLET | Freq: Three times a day (TID) | ORAL | 0 refills | Status: DC | PRN

## 2018-01-07 MED ORDER — TETANUS-DIPHTH-ACELL PERTUSSIS 5-2.5-18.5 LF-MCG/0.5 IM SUSP
0.5000 mL | Freq: Once | INTRAMUSCULAR | Status: AC
Start: 2018-01-07 — End: 2018-01-07
  Administered 2018-01-07: 0.5 mL via INTRAMUSCULAR
  Filled 2018-01-07: qty 0.5

## 2018-01-07 NOTE — ED Triage Notes (Signed)
Pt reports that he was fixing the gutter on his ladder when his foot was caught in the top rung. Pt then proceeded to fall back onto the ladder hitting his head on one of the rungs. At this time, pt reports that he and the ladder fell to the ground. Pt reports that his vision went black for a moment. Pt is c/o of right knee pain.

## 2018-01-07 NOTE — ED Notes (Signed)
Pt rung call bell. Pt stated "my pain medicine is not working." Assured pt that I will let RN know. Kim,RN made aware.
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
## 2018-01-19 ENCOUNTER — Other Ambulatory Visit: Payer: Self-pay

## 2018-01-19 ENCOUNTER — Emergency Department
Admission: EM | Admit: 2018-01-19 | Discharge: 2018-01-19 | Disposition: A | Discharge status: Home / Self Care | Payer: Self-pay | Attending: Emergency Medicine | Admitting: Emergency Medicine

## 2018-01-19 ENCOUNTER — Encounter: Payer: Self-pay | Admitting: Emergency Medicine

## 2018-01-19 ENCOUNTER — Emergency Department: Payer: Self-pay

## 2018-01-19 DIAGNOSIS — M109 Gout, unspecified: Secondary | ICD-10-CM

## 2018-01-19 DIAGNOSIS — F17210 Nicotine dependence, cigarettes, uncomplicated: Secondary | ICD-10-CM | POA: Insufficient documentation

## 2018-01-19 DIAGNOSIS — Z79899 Other long term (current) drug therapy: Secondary | ICD-10-CM | POA: Insufficient documentation

## 2018-01-19 DIAGNOSIS — J449 Chronic obstructive pulmonary disease, unspecified: Secondary | ICD-10-CM | POA: Insufficient documentation

## 2018-01-19 DIAGNOSIS — M1A0720 Idiopathic chronic gout, left ankle and foot, without tophus (tophi): Secondary | ICD-10-CM | POA: Insufficient documentation

## 2018-01-19 HISTORY — DX: Gout, unspecified: M10.9

## 2018-01-19 MED ORDER — PREDNISONE 10 MG (21) PO TBPK: ORAL_TABLET | ORAL | 0 refills | Status: DC

## 2018-01-19 MED ORDER — PROMETHAZINE HCL 25 MG/ML IJ SOLN
12.5000 mg | Freq: Once | INTRAMUSCULAR | Status: AC
  Administered 2018-01-19: 12.5 mg via INTRAVENOUS
  Filled 2018-01-19: qty 1

## 2018-01-19 MED ORDER — METHYLPREDNISOLONE SODIUM SUCC 125 MG IJ SOLR
80.0000 mg | Freq: Once | INTRAMUSCULAR | Status: DC
  Filled 2018-01-19: qty 2

## 2018-01-19 MED ORDER — KETOROLAC TROMETHAMINE 30 MG/ML IJ SOLN
30.0000 mg | Freq: Once | INTRAMUSCULAR | Status: AC
  Administered 2018-01-19: 30 mg via INTRAVENOUS
  Filled 2018-01-19: qty 1

## 2018-01-19 MED ORDER — COLCHICINE 0.6 MG PO TABS: ORAL_TABLET | ORAL | 0 refills | Status: DC

## 2018-01-19 MED ORDER — METHYLPREDNISOLONE SODIUM SUCC 125 MG IJ SOLR
80.0000 mg | Freq: Once | INTRAMUSCULAR | Status: AC
  Administered 2018-01-19: 80 mg via INTRAVENOUS

## 2018-01-19 NOTE — ED Notes (Signed)
Pt ambulatory upon discharge; declined wheel chair. Verbalized understanding of discharge instructions, follow-up care and prescriptions. VSS. Skin warm and dry. A&O x4.  

## 2018-01-19 NOTE — ED Notes (Signed)
Pt to ed with c/o left foot and ankle pain and redness.  Pt states hx of gout.  Pt states feels the same.  Pt states he normally takes oxycodone for pain but this is not helping this time.  Pt states " I am hoping to get a shot and then the meds to dissolve the uric acid"

## 2018-01-19 NOTE — ED Triage Notes (Signed)
L foot and ankle pain since yesterday. States feels like gout pain.

## 2018-01-19 NOTE — ED Notes (Signed)
Pt has come out his treatment room to inform this tech" I have been crying all night like a baby AND I NEED A DR OR SOMEONE IN HERE TO GIVE ME SHOT OR IV TO GET OUT OF THIS PAIN."  THIS TECH EXPLAINED SHE WOULD INFORM DR/RN OF HIS PAIN AND CONCERN.
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
## 2018-01-19 NOTE — ED Notes (Signed)
During iv start and med administration pt was falling asleep.  Pt does not appear in distress at all prior to meds, pt rates pain in foot 10/10.  Family remains at bedside.

## 2018-01-23 ENCOUNTER — Encounter: Payer: Self-pay | Admitting: Cardiovascular Disease

## 2018-01-23 DIAGNOSIS — I10 Essential (primary) hypertension: Secondary | ICD-10-CM

## 2018-01-23 DIAGNOSIS — E785 Hyperlipidemia, unspecified: Secondary | ICD-10-CM | POA: Insufficient documentation

## 2018-01-23 DIAGNOSIS — R079 Chest pain, unspecified: Secondary | ICD-10-CM | POA: Insufficient documentation

## 2018-01-23 DIAGNOSIS — F17200 Nicotine dependence, unspecified, uncomplicated: Secondary | ICD-10-CM | POA: Insufficient documentation

## 2018-01-23 HISTORY — DX: Essential (primary) hypertension: I10
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
## 2018-01-24 ENCOUNTER — Encounter: Payer: Self-pay | Admitting: Cardiovascular Disease

## 2018-01-24 ENCOUNTER — Ambulatory Visit (INDEPENDENT_AMBULATORY_CARE_PROVIDER_SITE_OTHER): Payer: Self-pay | Admitting: Cardiovascular Disease

## 2018-01-24 VITALS — BP 140/82 | HR 68 | Ht 66.0 in | Wt 219.8 lb

## 2018-01-24 DIAGNOSIS — R079 Chest pain, unspecified: Secondary | ICD-10-CM

## 2018-01-24 DIAGNOSIS — J9601 Acute respiratory failure with hypoxia: Secondary | ICD-10-CM

## 2018-01-24 DIAGNOSIS — E782 Mixed hyperlipidemia: Secondary | ICD-10-CM

## 2018-01-24 DIAGNOSIS — I10 Essential (primary) hypertension: Secondary | ICD-10-CM

## 2018-01-24 DIAGNOSIS — F17200 Nicotine dependence, unspecified, uncomplicated: Secondary | ICD-10-CM

## 2018-01-24 MED ORDER — ALBUTEROL SULFATE HFA 108 (90 BASE) MCG/ACT IN AERS: 1.0000 | INHALATION_SPRAY | Freq: Four times a day (QID) | RESPIRATORY_TRACT | 11 refills | Status: DC | PRN

## 2018-01-24 NOTE — Patient Instructions (Addendum)
Medication Instructions:   Albuterol inhaler 2 puffs twice a day And as needed Recommend smoking cessation  Labwork:  No new labs needed  Testing/Procedures:  No further testing at this time   Follow-Up: It was a pleasure seeing you in the office today. Please call us if you have new issues that need to be addressed before your next appt.  [phone]  Your physician wants you to follow-up in:  As needed  If you need a refill on your cardiac medications before your next appointment, please call your pharmacy.  For educational health videos Log in to : [email or web address] Or : [email or web address], password : [email or web address]

## 2018-01-25 NOTE — Addendum Note (Signed)
Addended by: Anselm Pancoast on: 01/25/2018 10:39 AM   Modules accepted: Orders

## 2018-05-15 ENCOUNTER — Ambulatory Visit (INDEPENDENT_AMBULATORY_CARE_PROVIDER_SITE_OTHER): Payer: Self-pay | Admitting: Family

## 2018-05-15 ENCOUNTER — Encounter: Payer: Self-pay | Admitting: Family

## 2018-05-15 VITALS — BP 159/110 | HR 77 | Temp 98.3°F | Ht 65.0 in | Wt 230.0 lb

## 2018-05-15 DIAGNOSIS — F1910 Other psychoactive substance abuse, uncomplicated: Secondary | ICD-10-CM

## 2018-05-15 DIAGNOSIS — R768 Other specified abnormal immunological findings in serum: Secondary | ICD-10-CM
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
## 2018-05-15 NOTE — Patient Instructions (Signed)
Nice to meet you!  We will check your blood work today.  Limit acetaminophen (Tylenol) usage to no more than 2 grams (2,000 mg) per day.  Avoid alcohol.  Do not share toothbrushes or razors.  Practice safe sex to protect against transmission as well as sexually transmitted disease.    Hepatitis C Hepatitis C is a viral infection of the liver. It can lead to scarring of the liver (cirrhosis), liver failure, or liver cancer. Hepatitis C may go undetected for months or years because people with the infection may not have symptoms, or they may have only mild symptoms. What are the causes? This condition is caused by the hepatitis C virus (HCV). The virus can spread from person to person (is contagious) through:  Blood.  Childbirth. A woman who has hepatitis C can pass it to her baby during birth.  Bodily fluids, such as breast milk, tears, semen, vaginal fluids, and saliva.  Blood transfusions or organ transplants done in the [place] before 1992.  What increases the risk? The following factors may make you more likely to develop this condition:  Having contact with unclean (contaminated) needles or syringes. This may result from: ? Acupuncture. ? Tattoing. ? Body piercing. ? Injecting drugs.  Having unprotected sex with someone who is infected.  Needing treatment to filter your blood (kidney dialysis).  Having HIV (human immunodeficiency virus) or AIDS (acquired immunodeficiency syndrome).  Working in a job that involves contact with blood or bodily fluids, such as health care.  What are the signs or symptoms? Symptoms of this condition include:  Fatigue.  Loss of appetite.  Nausea.  Vomiting.  Abdominal pain.  Dark yellow urine.  Yellowish skin and eyes (jaundice).  Itchy skin.  Clay-colored bowel movements.  Joint pain.  Bleeding and bruising easily.  Fluid building up in your stomach (ascites).  In some cases, you may not have any symptoms.  How is this diagnosed? This condition is diagnosed with:  Blood tests.  Other tests to check how well your liver is functioning. They may include: ? Magnetic resonance elastography (MRE). This imaging test uses MRIs and sound waves to measure liver stiffness. ? Transient elastography. This imaging test uses ultrasounds to measure liver stiffness. ? Liver biopsy. This test requires taking a small tissue sample from your liver to examine it under a microscope.  How is this treated? Your health care provider may perform noninvasive tests or a liver biopsy to help decide the best course of treatment. Treatment may include:  Antiviral medicines and other medicines.  Follow-up treatments every 6-12 months for infections or other liver conditions.  Receiving a donated liver (liver transplant).  Follow these instructions at home: Medicines  Take over-the-counter and prescription medicines only as told by your health care provider.  Take your antiviral medicine as told by your health care provider. Do not stop taking the antiviral even if you start to feel better.  Do not take any medicines unless approved by your health care provider, including over-the-counter medicines and birth control pills. Activity  Rest as needed.  Do not have sex unless approved by your health care provider.  Ask your health care provider when you may return to school or work. Eating and drinking  Eat a balanced diet with plenty of fruits and vegetables, whole grains, and lowfat (lean) meats or non-meat proteins (such as beans or tofu).  Drink enough fluids to keep your urine clear or pale yellow.  Do not drink alcohol. General instructions    Do not share toothbrushes, nail clippers, or razors.  Wash your hands frequently with soap and water. If soap and water are not available, use hand sanitizer.  Cover any cuts or open sores on your skin to prevent spreading the virus.  Keep all follow-up visits as  told by your health care provider. This is important. You may need follow-up visits every 6-12 months. How is this prevented? There is no vaccine for hepatitis C. The only way to prevent the disease is to reduce the risk of exposure to the virus. Make sure you:  Wash your hands frequently with soap and water. If soap and water are not available, use hand sanitizer.  Do not share needles or syringes.  Practice safe sex and use condoms.  Avoid handling blood or bodily fluids without gloves or other protection.  Avoid getting tattoos or piercings in shops or other locations that are not clean.  Contact a health care provider if:  You have a fever.  You develop abdominal pain.  You pass dark urine.  You pass clay-colored stools.  You develop joint pain. Get help right away if:  You have increasing fatigue or weakness.  You lose your appetite.  You cannot eat or drink without vomiting.  You develop jaundice or your jaundice gets worse.  You bruise or bleed easily. Summary  Hepatitis C is a viral infection of the liver. It can lead to scarring of the liver (cirrhosis), liver failure, or liver cancer.  The hepatitis C virus (HCV) causes this condition. The virus can pass from person to person (is contagious).  You should not take any medicines unless approved by your health care provider. This includes over-the-counter medicines and birth control pills. This information is not intended to replace advice given to you by your health care provider. Make sure you discuss any questions you have with your health care provider. Document Released: 06/30/2000 Document Revised: 08/08/2016 Document Reviewed: 08/08/2016 Elsevier Interactive Patient Education  2018 Elsevier Inc.  

## 2018-05-16 ENCOUNTER — Encounter: Payer: Self-pay | Admitting: Family

## 2018-05-16 DIAGNOSIS — F1910 Other psychoactive substance abuse, uncomplicated: Secondary | ICD-10-CM | POA: Insufficient documentation

## 2018-05-16 DIAGNOSIS — R768 Other specified abnormal immunological findings in serum: Secondary | ICD-10-CM | POA: Insufficient documentation

## 2018-05-16 NOTE — Assessment & Plan Note (Signed)
Mr. Forge's Hepatitis C status is currently unknown, however he indicates he was diagnosed about 2 years ago and has never been treated. 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 No personal or family history of liver disease. Check Hepatitis C RNA levels today. If positive, will schedule additional blood work and elastography for treatment. He will need [place] as well as [facility] lab assistance as he is currently uninsured. 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 Follow up and additional testing per blood work results.

## 2018-05-16 NOTE — Assessment & Plan Note (Signed)
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 He is not currently receiving counseling or attending support groups. Continues to have chronic low back pain that is not currently managed by pain management due to multiple confounding factors. Offered counseling services here which he has declined today. Discussed importance of having support structures for continued sobriety.

## 2018-05-18 LAB — HEPATITIS C RNA QUANTITATIVE
HCV QUANT LOG: 2.29 {Log_IU}/mL — AB
HCV RNA, PCR, QN: 195 IU/mL — ABNORMAL HIGH

## 2018-05-21 ENCOUNTER — Other Ambulatory Visit: Payer: Self-pay | Admitting: Family

## 2018-05-21 DIAGNOSIS — R768 Other specified abnormal immunological findings in serum: Secondary | ICD-10-CM

## 2018-05-23 ENCOUNTER — Telehealth: Payer: Self-pay | Admitting: Behavioral Health

## 2018-05-23 NOTE — Telephone Encounter (Signed)
Called Mr Jeremy Long, verified identity.  Informed him per Terri Piedra NP that he his Hep C viral load was 195.  Marya Amsler would like for him to come back in the office for repeat lab work to recheck the number because it is low for a viral load.  Patient verbalized understanding but states he will have to call the office back to schedule an lab appointment. Pricilla Riffle RN

## 2018-05-23 NOTE — Telephone Encounter (Signed)
-----   Message from [place], [place] sent at 05/21/2018 12:55 PM EST ----- Please inform Jeremy Long that he does have a viral load of 195. I would like him to come back in to recheck this number as it is low for a viral load as well as check the remaining Hepatitis C blood work at his convenience.  (orders have been placed)

## 2018-05-28 NOTE — Telephone Encounter (Signed)
Called Mr. Riviera, verified identity.  Attempted once again to schedule a lab appointment.  Explained it is just for labs and not an office visit.  He states he is unable to schedule at this time because his schedule is busy and he will call the office back by the end of the week.  He also states his medicaid was denied.  Explained there is a patient assistance program he can apply for, for lab work.  Explained to him he can fill out the form here in the office.  Patient verbalized understanding and states he will call the office back. Pricilla Riffle RN

## 2018-09-05 IMAGING — CR DG CHEST 2V
1 series · 2 of 2 positions shown · non-contrast
Comparison: Chest x-ray dated January 18, 2017.

CLINICAL DATA: Intermittent chest heaviness for the past 3 days
with shortness of breath.

EXAM:
CHEST - 2 VIEW

[Series 1: dg chest 2 view · 0.14mm/px · 2 of 2 slices shown]
[im 1/2]
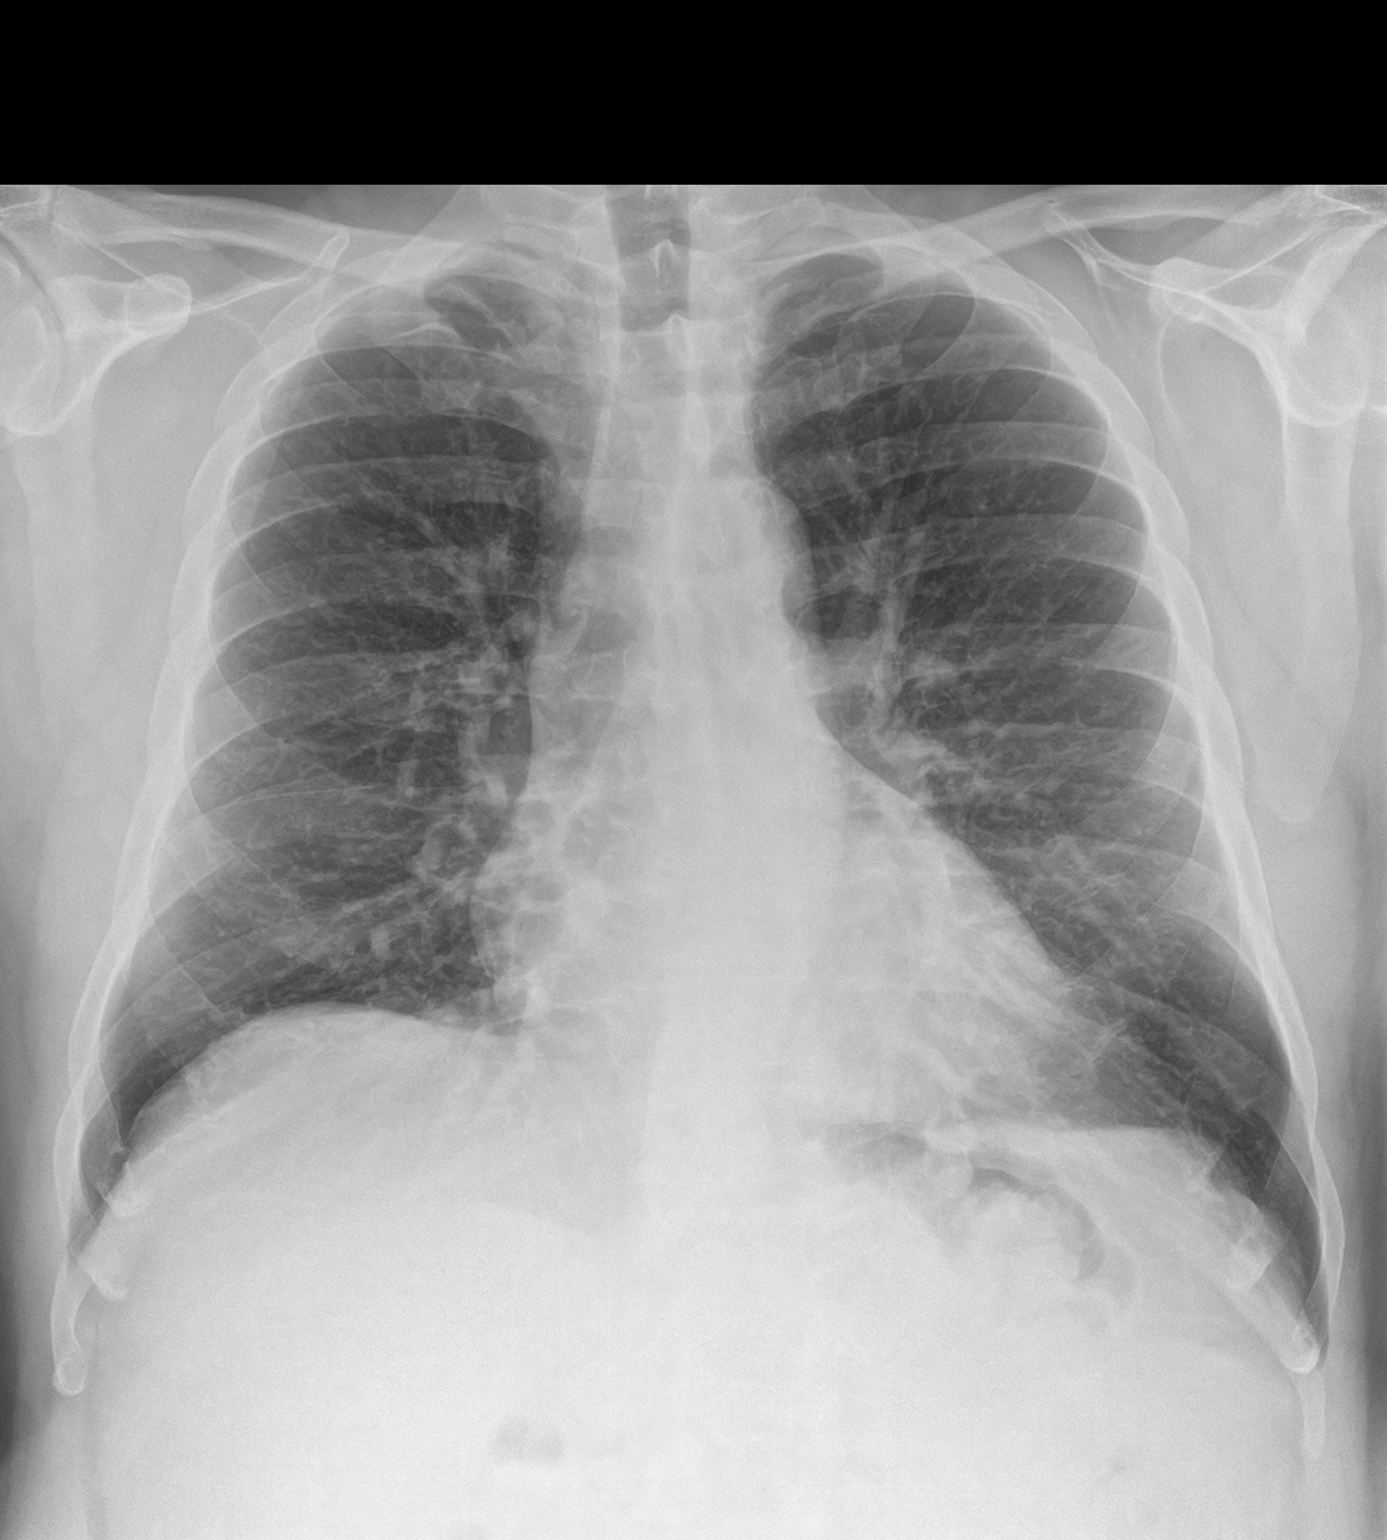
[im 2/2]
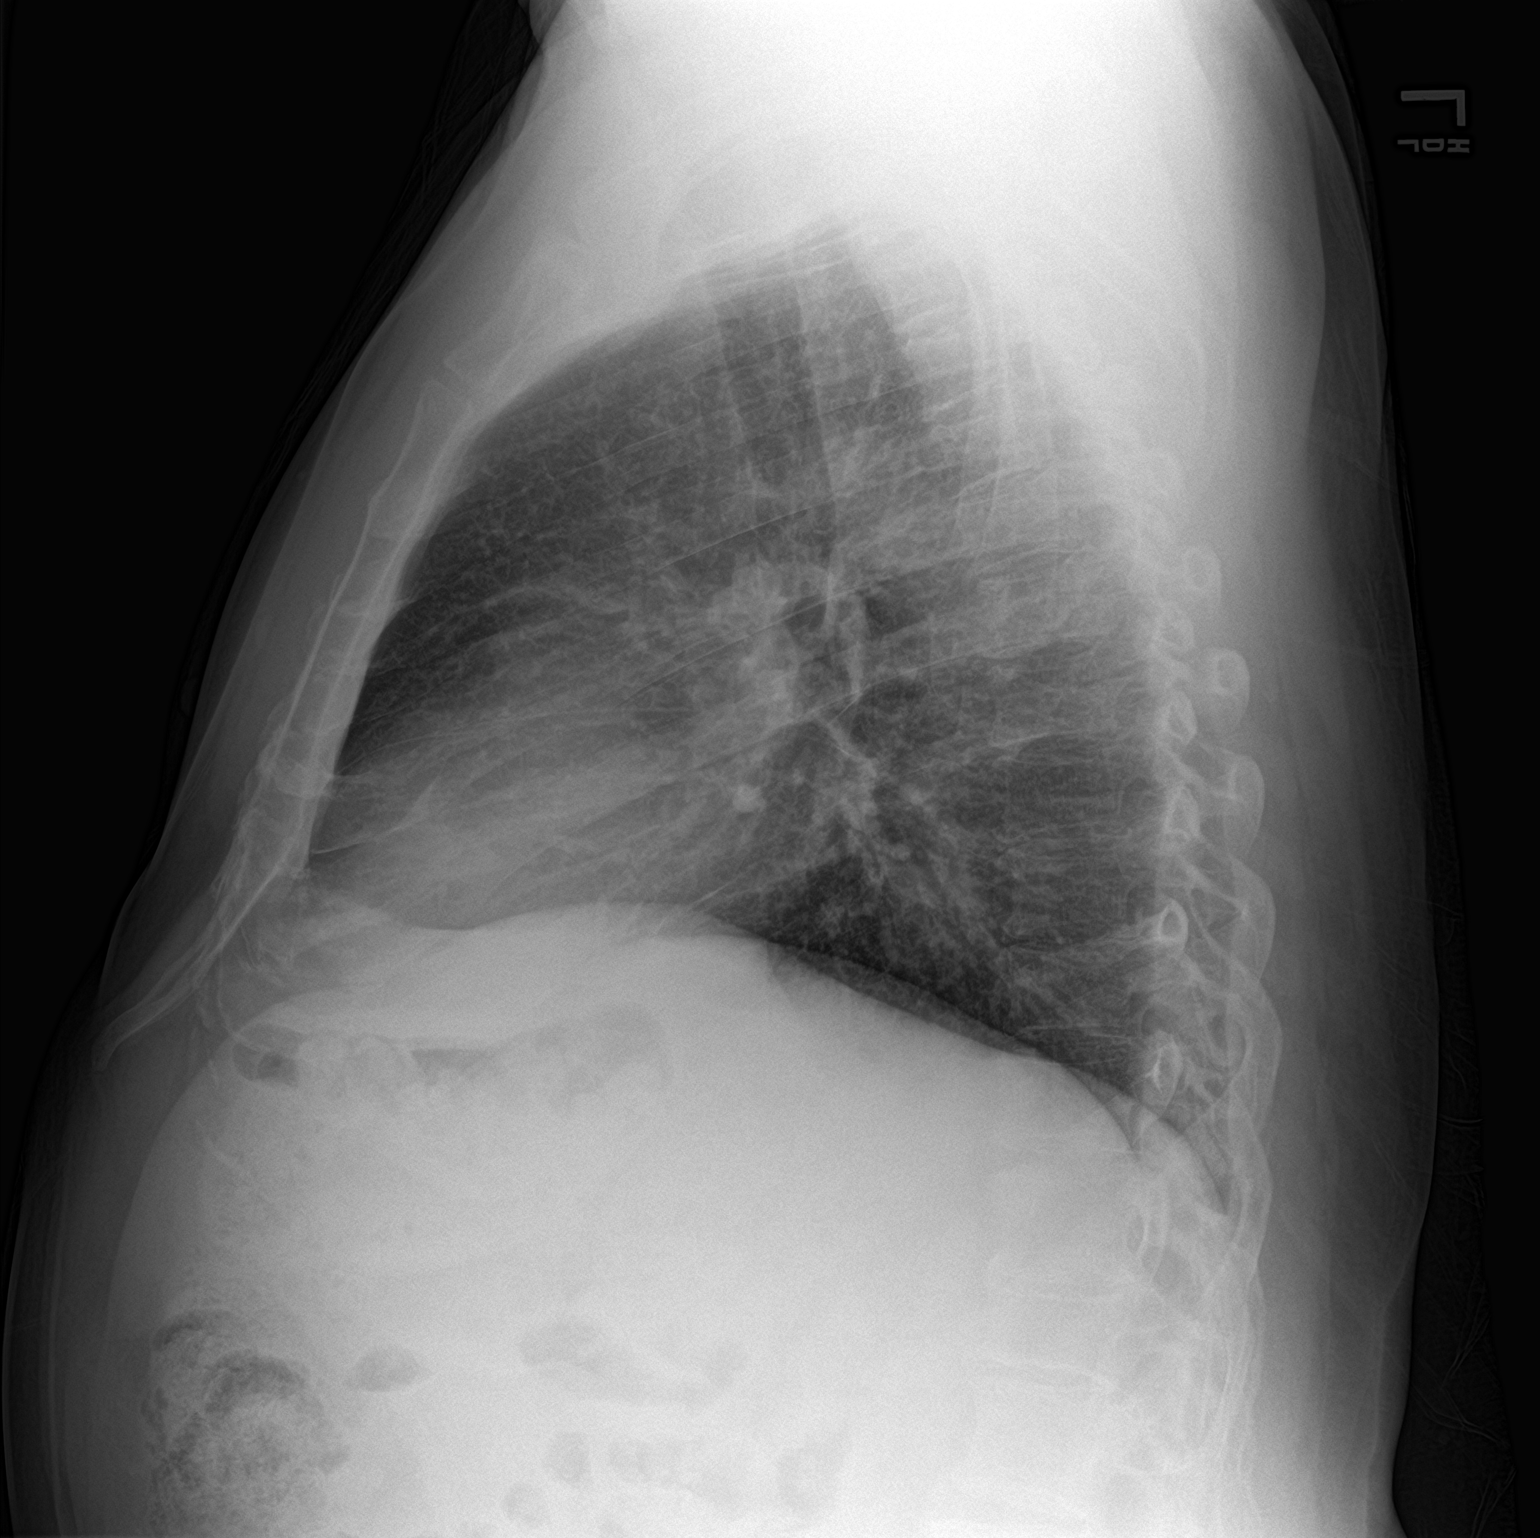

[2 of 2 positions shown; findings below may reference images not displayed]

FINDINGS: The heart size and mediastinal contours are within normal limits.
Both lungs are clear. The visualized skeletal structures are
unremarkable.
IMPRESSION: No active cardiopulmonary disease.

## 2019-01-01 ENCOUNTER — Other Ambulatory Visit: Payer: Self-pay

## 2019-01-01 ENCOUNTER — Emergency Department: Payer: Medicaid Other

## 2019-01-01 ENCOUNTER — Encounter: Payer: Self-pay | Admitting: Emergency Medicine

## 2019-01-01 ENCOUNTER — Emergency Department
Admission: EM | Admit: 2019-01-01 | Discharge: 2019-01-01 | Disposition: A | Discharge status: Home / Self Care | Payer: Medicaid Other | Attending: Emergency Medicine | Admitting: Emergency Medicine

## 2019-01-01 DIAGNOSIS — Y9389 Activity, other specified: Secondary | ICD-10-CM | POA: Insufficient documentation

## 2019-01-01 DIAGNOSIS — Y929 Unspecified place or not applicable: Secondary | ICD-10-CM | POA: Insufficient documentation

## 2019-01-01 DIAGNOSIS — Y998 Other external cause status: Secondary | ICD-10-CM | POA: Diagnosis not present

## 2019-01-01 DIAGNOSIS — S99921A Unspecified injury of right foot, initial encounter: Secondary | ICD-10-CM | POA: Diagnosis present

## 2019-01-01 DIAGNOSIS — F17210 Nicotine dependence, cigarettes, uncomplicated: Secondary | ICD-10-CM | POA: Insufficient documentation

## 2019-01-01 DIAGNOSIS — J449 Chronic obstructive pulmonary disease, unspecified: Secondary | ICD-10-CM | POA: Insufficient documentation

## 2019-01-01 DIAGNOSIS — X509XXA Other and unspecified overexertion or strenuous movements or postures, initial encounter: Secondary | ICD-10-CM | POA: Insufficient documentation

## 2019-01-01 DIAGNOSIS — M109 Gout, unspecified: Secondary | ICD-10-CM

## 2019-01-01 DIAGNOSIS — Z79899 Other long term (current) drug therapy: Secondary | ICD-10-CM | POA: Insufficient documentation

## 2019-01-01 DIAGNOSIS — I10 Essential (primary) hypertension: Secondary | ICD-10-CM | POA: Diagnosis not present

## 2019-01-01 MED ORDER — METHYLPREDNISOLONE SODIUM SUCC 125 MG IJ SOLR
125.0000 mg | Freq: Once | INTRAMUSCULAR | Status: AC
  Administered 2019-01-01: 125 mg via INTRAVENOUS
  Filled 2019-01-01: qty 2

## 2019-01-01 MED ORDER — OXYCODONE-ACETAMINOPHEN 5-325 MG PO TABS
1.0000 | ORAL_TABLET | ORAL | Status: AC | PRN
  Administered 2019-01-01 (×2): 1 via ORAL
  Filled 2019-01-01 (×2): qty 1

## 2019-01-01 MED ORDER — PREDNISONE 10 MG PO TABS: ORAL_TABLET | ORAL | 0 refills | Status: DC

## 2019-01-01 MED ORDER — NAPROXEN 250 MG PO TABS: 250.0000 mg | ORAL_TABLET | Freq: Three times a day (TID) | ORAL | 0 refills | Status: DC

## 2019-01-01 MED ORDER — KETOROLAC TROMETHAMINE 30 MG/ML IJ SOLN
30.0000 mg | Freq: Once | INTRAMUSCULAR | Status: AC
  Administered 2019-01-01: 30 mg via INTRAVENOUS
  Filled 2019-01-01: qty 1
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
## 2019-01-01 NOTE — ED Triage Notes (Signed)
States stumbled when getting out of bed two nights ago.  Arrives today with c/o persistent left foot pain.

## 2019-01-01 NOTE — ED Notes (Signed)
PO pain meds given  Foot elevated on pillow  Explained the wait   Verbalizes understanding

## 2019-01-01 NOTE — ED Notes (Signed)
See triage note  States he fell couple of days ago   Having pain across top of foot    Pain is lateral left foot  With min swelling

## 2019-02-20 ENCOUNTER — Other Ambulatory Visit: Payer: Self-pay | Admitting: Specialist

## 2019-02-20 DIAGNOSIS — J439 Emphysema, unspecified: Secondary | ICD-10-CM

## 2019-02-20 DIAGNOSIS — R0602 Shortness of breath: Secondary | ICD-10-CM

## 2019-02-20 DIAGNOSIS — G4733 Obstructive sleep apnea (adult) (pediatric): Secondary | ICD-10-CM

## 2019-02-25 ENCOUNTER — Other Ambulatory Visit: Payer: Self-pay

## 2019-02-25 ENCOUNTER — Ambulatory Visit
Admission: RE | Admit: 2019-02-25 | Discharge: 2019-02-25 | Disposition: A | Discharge status: Home / Self Care | Payer: Medicaid Other | Source: Ambulatory Visit | Attending: Specialist | Admitting: Specialist

## 2019-02-25 DIAGNOSIS — G4733 Obstructive sleep apnea (adult) (pediatric): Secondary | ICD-10-CM

## 2019-02-25 DIAGNOSIS — J439 Emphysema, unspecified: Secondary | ICD-10-CM

## 2019-02-25 DIAGNOSIS — R0602 Shortness of breath: Secondary | ICD-10-CM | POA: Diagnosis not present

## 2019-04-12 ENCOUNTER — Other Ambulatory Visit: Payer: Self-pay

## 2019-04-12 ENCOUNTER — Emergency Department
Admission: EM | Admit: 2019-04-12 | Discharge: 2019-04-12 | Disposition: A | Discharge status: Home / Self Care | Payer: Medicaid Other | Attending: Emergency Medicine | Admitting: Emergency Medicine

## 2019-04-12 DIAGNOSIS — I10 Essential (primary) hypertension: Secondary | ICD-10-CM | POA: Insufficient documentation

## 2019-04-12 DIAGNOSIS — M109 Gout, unspecified: Secondary | ICD-10-CM | POA: Diagnosis not present

## 2019-04-12 DIAGNOSIS — F17210 Nicotine dependence, cigarettes, uncomplicated: Secondary | ICD-10-CM | POA: Insufficient documentation

## 2019-04-12 DIAGNOSIS — Z79899 Other long term (current) drug therapy: Secondary | ICD-10-CM | POA: Insufficient documentation

## 2019-04-12 DIAGNOSIS — J449 Chronic obstructive pulmonary disease, unspecified: Secondary | ICD-10-CM | POA: Diagnosis not present

## 2019-04-12 DIAGNOSIS — M25572 Pain in left ankle and joints of left foot: Secondary | ICD-10-CM | POA: Diagnosis present

## 2019-04-12 MED ORDER — KETOROLAC TROMETHAMINE 30 MG/ML IJ SOLN
30.0000 mg | Freq: Once | INTRAMUSCULAR | Status: AC
  Administered 2019-04-12: 30 mg via INTRAVENOUS
  Filled 2019-04-12: qty 1

## 2019-04-12 MED ORDER — PREDNISONE 50 MG PO TABS: ORAL_TABLET | ORAL | 0 refills | Status: DC

## 2019-04-12 MED ORDER — METHYLPREDNISOLONE SODIUM SUCC 125 MG IJ SOLR
125.0000 mg | Freq: Once | INTRAMUSCULAR | Status: AC
  Administered 2019-04-12: 21:00:00 125 mg via INTRAVENOUS
  Filled 2019-04-12: qty 2

## 2019-04-12 MED ORDER — OXYCODONE-ACETAMINOPHEN 5-325 MG PO TABS
1.0000 | ORAL_TABLET | Freq: Once | ORAL | Status: AC
  Administered 2019-04-12: 21:00:00 1 via ORAL
  Filled 2019-04-12: qty 1
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
## 2019-04-12 NOTE — ED Triage Notes (Signed)
Pt with c/o pain in left foot. Pt's left foot somewhat swollen, No redness noted.

## 2019-04-12 NOTE — ED Notes (Signed)
Pt with continued c/o pain in left foot. Pain meds given. See [facility]

## 2019-04-12 NOTE — ED Notes (Signed)
IV dc'd per EDP

## 2019-05-19 ENCOUNTER — Ambulatory Visit: Payer: Medicaid Other | Admitting: Anesthesiology

## 2019-05-19 ENCOUNTER — Encounter: Payer: Self-pay | Admitting: Anesthesiology

## 2019-05-19 ENCOUNTER — Other Ambulatory Visit: Payer: Self-pay

## 2019-05-19 ENCOUNTER — Ambulatory Visit: Payer: Medicaid Other | Attending: Anesthesiology | Admitting: Anesthesiology

## 2019-05-19 DIAGNOSIS — M5432 Sciatica, left side: Secondary | ICD-10-CM

## 2019-05-19 DIAGNOSIS — M5431 Sciatica, right side: Secondary | ICD-10-CM | POA: Diagnosis not present

## 2019-05-19 DIAGNOSIS — F1910 Other psychoactive substance abuse, uncomplicated: Secondary | ICD-10-CM

## 2019-05-19 DIAGNOSIS — M47816 Spondylosis without myelopathy or radiculopathy, lumbar region: Secondary | ICD-10-CM

## 2019-05-19 DIAGNOSIS — M51369 Other intervertebral disc degeneration, lumbar region without mention of lumbar back pain or lower extremity pain: Secondary | ICD-10-CM

## 2019-05-19 DIAGNOSIS — M5136 Other intervertebral disc degeneration, lumbar region: Secondary | ICD-10-CM | POA: Diagnosis not present

## 2019-05-19 DIAGNOSIS — M5430 Sciatica, unspecified side: Secondary | ICD-10-CM

## 2019-05-19 HISTORY — DX: Other intervertebral disc degeneration, lumbar region: M51.36

## 2019-05-19 HISTORY — DX: Spondylosis without myelopathy or radiculopathy, lumbar region: M47.816

## 2019-05-19 HISTORY — DX: Other intervertebral disc degeneration, lumbar region without mention of lumbar back pain or lower extremity pain: M51.369

## 2019-05-19 HISTORY — DX: Sciatica, unspecified side: M54.30

## 2019-05-19 HISTORY — DX: Other psychoactive substance abuse, uncomplicated: F19.10

## 2019-05-20 ENCOUNTER — Encounter: Payer: Self-pay | Admitting: Anesthesiology
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
## 2019-05-22 LAB — TOXASSURE SELECT 13 (MW), URINE

## 2019-06-09 ENCOUNTER — Ambulatory Visit: Payer: Medicaid Other | Attending: Anesthesiology | Admitting: Anesthesiology

## 2019-06-09 ENCOUNTER — Encounter: Payer: Self-pay | Admitting: Anesthesiology

## 2019-06-09 ENCOUNTER — Other Ambulatory Visit: Payer: Self-pay

## 2019-06-09 DIAGNOSIS — M51369 Other intervertebral disc degeneration, lumbar region without mention of lumbar back pain or lower extremity pain: Secondary | ICD-10-CM

## 2019-06-09 DIAGNOSIS — G894 Chronic pain syndrome: Secondary | ICD-10-CM

## 2019-06-09 DIAGNOSIS — F1190 Opioid use, unspecified, uncomplicated: Secondary | ICD-10-CM

## 2019-06-09 DIAGNOSIS — M545 Low back pain, unspecified: Secondary | ICD-10-CM

## 2019-06-09 DIAGNOSIS — F1910 Other psychoactive substance abuse, uncomplicated: Secondary | ICD-10-CM

## 2019-06-09 DIAGNOSIS — M47816 Spondylosis without myelopathy or radiculopathy, lumbar region: Secondary | ICD-10-CM

## 2019-06-09 DIAGNOSIS — M5431 Sciatica, right side: Secondary | ICD-10-CM

## 2019-06-09 DIAGNOSIS — G8929 Other chronic pain: Secondary | ICD-10-CM

## 2019-06-09 DIAGNOSIS — M5136 Other intervertebral disc degeneration, lumbar region: Secondary | ICD-10-CM

## 2019-06-09 DIAGNOSIS — M5432 Sciatica, left side: Secondary | ICD-10-CM

## 2019-06-09 HISTORY — DX: Chronic pain syndrome: G89.4

## 2019-06-09 MED ORDER — FENTANYL 50 MCG/HR TD PT72: 1.0000 | MEDICATED_PATCH | TRANSDERMAL | 0 refills | Status: DC

## 2019-06-12 NOTE — Progress Notes (Signed)
Virtual Visit via Telephone Note  I connected with Jeremy Long on 06/12/19 at  3:00 PM EST by telephone and verified that I am speaking with the correct person using two identifiers.  Location: Patient: Home Provider: Pain control center   I discussed the limitations, risks, security and privacy concerns of performing an evaluation and management service by telephone and the availability of in person appointments. I also discussed with the patient that there may be a patient responsible charge related to this service. The patient expressed understanding and agreed to proceed.   History of Present Illness: I was able to catch Jeremy Long via telephone for his virtual conference.  We did this secondary to the ongoing Covid crisis.  He continues to be miserable he reports.  He continues to have severe back pain bilateral hip pain and pain that radiates into the legs.  The quality of this has been stable over time with no recent changes reported however his pain score remains an 8-10 throughout most of the day.  He denies any change in lower extremity strength or function or bowel or bladder function.  He reports inability to sleep at night secondary to pain that keeps him up most of the night.  He reports that he does his stretching exercises and these have been unsuccessful.  He has discontinued the Suboxone and requests opioid management to assist.  He has been on anti-inflammatories with minimal success.  He furthermore denies any illicit medication or drug use and denies diverting or illicit use of opioids    Observations/Objective:  Current Outpatient Medications:  .  fentaNYL (DURAGESIC) 50 MCG/HR, Place 1 patch onto the skin every 3 (three) days., Disp: 10 patch, Rfl: 0 .  lisinopril (ZESTRIL) 10 MG tablet, Take 10 mg by mouth daily., Disp: , Rfl:  .  LORazepam (ATIVAN) 1 MG tablet, Take 1 mg by mouth 3 (three) times daily as needed., Disp: , Rfl: 0 .  naproxen (NAPROSYN) 250 MG  tablet, Take 1 tablet (250 mg total) by mouth 3 (three) times daily with meals. (Patient not taking: Reported on 05/19/2019), Disp: 30 tablet, Rfl: 0 .  predniSONE (DELTASONE) 50 MG tablet, Take one 50 mg tablet once a day for the next five days. (Patient not taking: Reported on 05/19/2019), Disp: 5 tablet, Rfl: 0 .  sildenafil (REVATIO) 20 MG tablet, TAKE 1 - 3 TABLETS AS DIRECTED., Disp: , Rfl: 3  Assessment and Plan:  1. DDD (degenerative disc disease), lumbar   2. Facet arthritis of lumbar region   3. Drug abuse ([place])   4. Bilateral sciatica   5. Facet arthropathy, lumbar   6. Chronic bilateral low back pain without sciatica   7. Chronic pain syndrome   8. Chronic, continuous use of opioids   Upon review of the [facility] and upon counseling with some of the other pain physicians and Jeremy Long I have decided to assist with his medication management for his chronic pain condition.  He is in a difficult situation in that he has had previous relief with opioid medication but this was terminated when his previous physician changed jobs.  I have spoken with Dr. Algernon Huxley via telephone today.  Based on this discussion I am going to start Jeremy Long on a 50 mcg Duragesic patch which reportedly was helpful in the past.  This will be utilized every 3 days and a prescription will be initiated today.  We will see him on a monthly basis with evaluation.  He is aware of the pain control clinic policy and I have reiterated that he must remain entirely compliant with this or face potential termination from the clinic.  We will schedule him for a 1 month return to clinic. Follow Up Instructions:    I discussed the assessment and treatment plan with the patient. The patient was provided an opportunity to ask questions and all were answered. The patient agreed with the plan and demonstrated an understanding of the instructions.   The patient was advised to call back or seek an  in-person evaluation if the symptoms worsen or if the condition fails to improve as anticipated.  I provided 30 minutes of non-face-to-face time during this encounter.   Molli Barrows, MD

## 2019-06-16 ENCOUNTER — Ambulatory Visit: Payer: Medicaid Other | Admitting: Internal Medicine

## 2019-07-08 ENCOUNTER — Ambulatory Visit: Payer: Medicaid Other | Admitting: Anesthesiology

## 2019-07-08 ENCOUNTER — Other Ambulatory Visit: Payer: Self-pay

## 2019-07-08 ENCOUNTER — Telehealth: Payer: Self-pay

## 2019-07-08 NOTE — Telephone Encounter (Signed)
Pt came in today for MM appt , I instructed pt that it was a VV, I told Dr Andree Elk and he said he would call pt tomorrow at 130. Pt is on his 3rd day of his patch and is due to be changed tomorrow, but the problem is Dr Andree Elk will need to get prior auth for patches, pt had to pay out of pocket last time.

## 2019-07-09 ENCOUNTER — Encounter: Payer: Self-pay | Admitting: Anesthesiology

## 2019-07-09 ENCOUNTER — Ambulatory Visit: Payer: Medicaid Other | Attending: Anesthesiology | Admitting: Anesthesiology

## 2019-07-09 ENCOUNTER — Other Ambulatory Visit: Payer: Self-pay

## 2019-07-09 VITALS — BP 180/98 | HR 80 | Temp 98.1°F | Ht 66.0 in | Wt 273.0 lb

## 2019-07-09 DIAGNOSIS — G894 Chronic pain syndrome: Secondary | ICD-10-CM | POA: Diagnosis not present

## 2019-07-09 DIAGNOSIS — M5136 Other intervertebral disc degeneration, lumbar region: Secondary | ICD-10-CM | POA: Diagnosis not present

## 2019-07-09 DIAGNOSIS — F1190 Opioid use, unspecified, uncomplicated: Secondary | ICD-10-CM

## 2019-07-09 DIAGNOSIS — M47816 Spondylosis without myelopathy or radiculopathy, lumbar region: Secondary | ICD-10-CM

## 2019-07-09 DIAGNOSIS — J9601 Acute respiratory failure with hypoxia: Secondary | ICD-10-CM

## 2019-07-09 DIAGNOSIS — M961 Postlaminectomy syndrome, not elsewhere classified: Secondary | ICD-10-CM

## 2019-07-09 DIAGNOSIS — F1910 Other psychoactive substance abuse, uncomplicated: Secondary | ICD-10-CM

## 2019-07-09 HISTORY — DX: Postlaminectomy syndrome, not elsewhere classified: M96.1

## 2019-07-09 MED ORDER — FENTANYL 50 MCG/HR TD PT72: 1.0000 | MEDICATED_PATCH | TRANSDERMAL | 0 refills | Status: DC

## 2019-07-09 NOTE — Progress Notes (Signed)
Subjective:  Patient ID: Jeremy Long, male    DOB: [birth year]  Age: 53 y.o. MRN: [id number]  CC: Back Pain   Procedure: None  HPI Jeremy Long presents for reevaluation. He was last seen a month ago and continues to have low back pain and posterior lateral leg pain similar to what he is experienced in the past. His symptom complex has been stable. He has done well with the Duragesic patch. This is been going on for approximately 1 month and he reports getting good relief rated at 50 to 75% for the first day 50% for the second day and generally has minimal to no relief on the third day. Otherwise he is in his usual state of health no new contributory changes today. He reports no side effects with the medications. He reports that he has been compliant with our regimen and denies any diversion. He denies any other illicit medication use considering that he has a historical positive on this.  Outpatient Medications Prior to Visit  Medication Sig Dispense Refill  . lisinopril (ZESTRIL) 10 MG tablet Take 10 mg by mouth daily.    Marland Kitchen LORazepam (ATIVAN) 1 MG tablet Take 1 mg by mouth 3 (three) times daily as needed.  0  . naproxen (NAPROSYN) 250 MG tablet Take 1 tablet (250 mg total) by mouth 3 (three) times daily with meals. (Patient not taking: Reported on 07/09/2019) 30 tablet 0  . predniSONE (DELTASONE) 50 MG tablet Take one 50 mg tablet once a day for the next five days. (Patient not taking: Reported on 07/09/2019) 5 tablet 0  . sildenafil (REVATIO) 20 MG tablet TAKE 1 - 3 TABLETS AS DIRECTED.  3  . fentaNYL (DURAGESIC) 50 MCG/HR Place 1 patch onto the skin every 3 (three) days. 10 patch 0   No facility-administered medications prior to visit.    Review of Systems CNS: No confusion or sedation Cardiac: No angina or palpitations GI: No abdominal pain or constipation Constitutional: No nausea vomiting fevers or chills  Objective:  BP (!) 180/98   Pulse 80   Temp 98.1 F (36.7 C)    Ht 5\' 6"  (1.676 m)   Wt 273 lb (123.8 kg)   SpO2 98%   BMI 44.06 kg/m    BP Readings from Last 3 Encounters:  07/09/19 (!) 180/98  05/19/19 (!) 164/112  04/12/19 (!) 143/85     Wt Readings from Last 3 Encounters:  07/09/19 273 lb (123.8 kg)  05/19/19 260 lb (117.9 kg)  04/12/19 265 lb (120.2 kg)     Physical Exam Pt is alert and oriented PERRL EOMI HEART IS RRR no murmur or rub LCTA no wheezing or rales MUSCULOSKELETAL reveals a severely antalgic gait. No other change in lower extremity strength or function are noted  Labs  No results found for: HGBA1C Lab Results  Component Value Date   CREATININE 1.00 11/22/2017    -------------------------------------------------------------------------------------------------------------------- Lab Results  Component Value Date   WBC 11.8 (H) 11/22/2017   HGB 15.7 11/22/2017   HCT 45.8 11/22/2017   PLT 216 11/22/2017   GLUCOSE 126 (H) 11/22/2017   NA 134 (L) 11/22/2017   K 4.9 11/22/2017   CL 101 11/22/2017   CREATININE 1.00 11/22/2017   BUN 22 (H) 11/22/2017   CO2 26 11/22/2017    --------------------------------------------------------------------------------------------------------------------- No results found.   Assessment & Plan:   Jeremy Long was seen today for back pain.  Diagnoses and all orders for this visit:  Chronic pain syndrome -  ToxASSURE Select 13 (MW), Urine  Chronic, continuous use of opioids -     ToxASSURE Select 13 (MW), Urine  Other orders -     fentaNYL (DURAGESIC) 50 MCG/HR; Place 1 patch onto the skin every 3 (three) days.        ----------------------------------------------------------------------------------------------------------------------  Problem List Items Addressed This Visit      Unprioritized   Chronic pain syndrome - Primary   Relevant Medications   fentaNYL (DURAGESIC) 50 MCG/HR   Other Relevant Orders   ToxASSURE Select 13 (MW), Urine   Chronic,  continuous use of opioids   Relevant Orders   ToxASSURE Select 13 (MW), Urine        ----------------------------------------------------------------------------------------------------------------------  1. Chronic pain syndrome We will continue with his current medication management. I have reviewed the [facility] practitioner database information and it is appropriate. Refill will be given for December 23 with return to clinic in 1 month. A urine drug screen also is sent today. - ToxASSURE Select 13 (MW), Urine  2. Chronic, continuous use of opioids As above  1. Chronic pain syndrome   2. Chronic, continuous use of opioids   3. DDD (degenerative disc disease), lumbar   4. Facet arthritis of lumbar region   5. Drug abuse ([place])   6. Post laminectomy syndrome   Based on our discussion today I am going to refill his medications as mentioned and want to continue at this current regimen at this point. I have encouraged him to continue with back stretching strengthening exercises as per previous discussion. Continue follow-up with his primary care physicians as well. - ToxASSURE Select 13 (MW), Urine    ----------------------------------------------------------------------------------------------------------------------  I am having Jeremy Long maintain his LORazepam, sildenafil, lisinopril, naproxen, predniSONE, and fentaNYL.   Meds ordered this encounter  Medications  . fentaNYL (DURAGESIC) 50 MCG/HR    Sig: Place 1 patch onto the skin every 3 (three) days.    Dispense:  10 patch    Refill:  0   Patient's Medications  New Prescriptions   No medications on file  Previous Medications   LISINOPRIL (ZESTRIL) 10 MG TABLET    Take 10 mg by mouth daily.   LORAZEPAM (ATIVAN) 1 MG TABLET    Take 1 mg by mouth 3 (three) times daily as needed.   NAPROXEN (NAPROSYN) 250 MG TABLET    Take 1 tablet (250 mg total) by mouth 3 (three) times daily with meals.   PREDNISONE  (DELTASONE) 50 MG TABLET    Take one 50 mg tablet once a day for the next five days.   SILDENAFIL (REVATIO) 20 MG TABLET    TAKE 1 - 3 TABLETS AS DIRECTED.  Modified Medications   Modified Medication Previous Medication   FENTANYL (DURAGESIC) 50 MCG/HR fentaNYL (DURAGESIC) 50 MCG/HR      Place 1 patch onto the skin every 3 (three) days.    Place 1 patch onto the skin every 3 (three) days.  Discontinued Medications   No medications on file   ----------------------------------------------------------------------------------------------------------------------  Follow-up: Return in about 1 month (around 08/09/2019) for evaluation, med refill.    Molli Barrows, MD

## 2019-07-14 LAB — TOXASSURE SELECT 13 (MW), URINE

## 2019-07-22 ENCOUNTER — Ambulatory Visit: Payer: Medicaid Other | Admitting: Internal Medicine

## 2019-07-22 ENCOUNTER — Telehealth: Payer: Self-pay | Admitting: Anesthesiology

## 2019-07-22 NOTE — Telephone Encounter (Signed)
I called the phone number given and left a message stating that I have resubmitted the PA request to Medicaid.

## 2019-07-22 NOTE — Telephone Encounter (Addendum)
Patient states he has had to pay for last 2 scripts from Dr. Andree Elk. He talked to pharmacy and they told him to have our office contact medicaid and let them know that this would be an ongoing medication treatment. They told him this could be done now to avoid problems when he comes to fill next script. Also pharmacy reported we did not do prior auth for last meds. The only place I see mention of a prior Josem Kaufmann is in phone msg put in on 07-08-19 Patient is having phone problems but will have his phone fixed tomorrow.  He left girlfriends number to lvmail on if you need to reach him before that.  [phone]

## 2019-08-06 ENCOUNTER — Other Ambulatory Visit: Payer: Self-pay

## 2019-08-06 ENCOUNTER — Telehealth: Payer: Self-pay | Admitting: Anesthesiology

## 2019-08-06 ENCOUNTER — Encounter: Payer: Self-pay | Admitting: Anesthesiology

## 2019-08-06 ENCOUNTER — Ambulatory Visit: Payer: Medicaid Other | Attending: Anesthesiology | Admitting: Anesthesiology

## 2019-08-06 ENCOUNTER — Ambulatory Visit: Payer: Medicaid Other | Admitting: Anesthesiology

## 2019-08-06 DIAGNOSIS — M47816 Spondylosis without myelopathy or radiculopathy, lumbar region: Secondary | ICD-10-CM

## 2019-08-06 DIAGNOSIS — M5431 Sciatica, right side: Secondary | ICD-10-CM

## 2019-08-06 DIAGNOSIS — M5432 Sciatica, left side: Secondary | ICD-10-CM

## 2019-08-06 DIAGNOSIS — J439 Emphysema, unspecified: Secondary | ICD-10-CM

## 2019-08-06 DIAGNOSIS — G894 Chronic pain syndrome: Secondary | ICD-10-CM

## 2019-08-06 DIAGNOSIS — Z79891 Long term (current) use of opiate analgesic: Secondary | ICD-10-CM

## 2019-08-06 DIAGNOSIS — J9611 Chronic respiratory failure with hypoxia: Secondary | ICD-10-CM

## 2019-08-06 DIAGNOSIS — F1910 Other psychoactive substance abuse, uncomplicated: Secondary | ICD-10-CM

## 2019-08-06 DIAGNOSIS — M5136 Other intervertebral disc degeneration, lumbar region: Secondary | ICD-10-CM | POA: Diagnosis not present

## 2019-08-06 DIAGNOSIS — M961 Postlaminectomy syndrome, not elsewhere classified: Secondary | ICD-10-CM

## 2019-08-06 DIAGNOSIS — J9601 Acute respiratory failure with hypoxia: Secondary | ICD-10-CM

## 2019-08-06 DIAGNOSIS — F1190 Opioid use, unspecified, uncomplicated: Secondary | ICD-10-CM

## 2019-08-06 MED ORDER — FENTANYL 50 MCG/HR TD PT72: 1.0000 | MEDICATED_PATCH | TRANSDERMAL | 0 refills | Status: DC

## 2019-08-06 NOTE — Telephone Encounter (Signed)
Please call patient regarding medications / he still cannot get them from pharmacy

## 2019-08-06 NOTE — Telephone Encounter (Signed)
Patient lvmail stating pharmacy is telling him he needs approval for the high lvl of medication on the script, maybe the quantity. The PA papers at nurses station stated what the pharmacy is looking for. Please call pharmacy to determine what is needed

## 2019-08-06 NOTE — Progress Notes (Signed)
Virtual Visit via Telephone Note  I connected with Jeremy Long on 08/06/19 at 12:00 PM EST by telephone and verified that I am speaking with the correct person using two identifiers.  Location: Patient: Home Provider: Pain control center   I discussed the limitations, risks, security and privacy concerns of performing an evaluation and management service by telephone and the availability of in person appointments. I also discussed with the patient that there may be a patient responsible charge related to this service. The patient expressed understanding and agreed to proceed.   History of Present Illness: I spoke with Jeremy Long today via telephone.  He was unable to do the video portion of the virtual visit.  He reports that he has been doing well with his medication management.  He generally gets 24 hours of good relief and then gradual increase in pain over the second day but minimal pain relief during the third day.  He reports that the pain is severe on the third day and keeps him from sleeping and limits his ability to function productively.  No side effects are reported with the medication.  He does state that one of his patches recently came off while in a hot tub.  He has 1 patch short secondary to that.  The quality characteristic distribution of his low back pain and other discomforts are stable in nature.  His strength to the lower extremities is at baseline.  Bowel bladder function has been stable as well.  No side effects are reported with his medications.    Observations/Objective:  Current Outpatient Medications:  .  fentaNYL (DURAGESIC) 50 MCG/HR, Place 1 patch onto the skin every 3 (three) days., Disp: 10 patch, Rfl: 0 .  lisinopril (ZESTRIL) 10 MG tablet, Take 10 mg by mouth daily., Disp: , Rfl:  .  LORazepam (ATIVAN) 1 MG tablet, Take 1 mg by mouth 3 (three) times daily as needed., Disp: , Rfl: 0 .  naproxen (NAPROSYN) 250 MG tablet, Take 1 tablet (250 mg total) by  mouth 3 (three) times daily with meals. (Patient not taking: Reported on 07/09/2019), Disp: 30 tablet, Rfl: 0 .  predniSONE (DELTASONE) 50 MG tablet, Take one 50 mg tablet once a day for the next five days. (Patient not taking: Reported on 07/09/2019), Disp: 5 tablet, Rfl: 0 .  sildenafil (REVATIO) 20 MG tablet, TAKE 1 - 3 TABLETS AS DIRECTED., Disp: , Rfl: 3  Assessment and Plan: 1. Chronic pain syndrome   2. Chronic, continuous use of opioids   3. DDD (degenerative disc disease), lumbar   4. Drug abuse ([place])   5. Facet arthritis of lumbar region   6. Post laminectomy syndrome   7. Bilateral sciatica   8. Facet arthropathy, lumbar   9. Pulmonary emphysema, unspecified emphysema type ([place]) Chronic  10. Acute hypoxemic respiratory failure ([facility]) Chronic  I have reviewed the [facility] practitioner database information and it is appropriate.  He is scheduled for refills on the 22nd but we will schedule to have the Duragesic available to him on the 20th.  He is having problems with reimbursement for his medications.  He has asked for assistance and I directed him to the pain clinic nurses to see if they can assist him.  Ultimately he may require every other day dosing with the Duragesic secondary to his response.  Because of his previous drug abuse issues I am going to keep him on the patch and this has been discussed with him Dr. Andree Elk.  Follow Up Instructions:    I discussed the assessment and treatment plan with the patient. The patient was provided an opportunity to ask questions and all were answered. The patient agreed with the plan and demonstrated an understanding of the instructions.   The patient was advised to call back or seek an in-person evaluation if the symptoms worsen or if the condition fails to improve as anticipated.  I provided 30 minutes of non-face-to-face time during this encounter.   Jeremy Barrows, MD

## 2019-08-06 NOTE — Telephone Encounter (Signed)
Received PA request from [facility] pharmacy, sent to nurses station. Patient called asking about PA. I let him know we have received and it would be sent to Nurses. Patient is also asking that we check on authorizations for previous 2 scripts so he can get reimbursed for out of pocket expenses. Please call patient to discuss, he has spoken with Medicaid and they told him what he needs.

## 2019-08-06 NOTE — Telephone Encounter (Signed)
I called [employer], Fentanyl patch has been approved through 01-18-20. Patient notified.

## 2019-08-07 ENCOUNTER — Telehealth: Payer: Self-pay | Admitting: *Deleted

## 2019-08-14 ENCOUNTER — Other Ambulatory Visit: Payer: Self-pay | Admitting: Internal Medicine

## 2019-08-14 DIAGNOSIS — Z8619 Personal history of other infectious and parasitic diseases: Secondary | ICD-10-CM

## 2019-09-01 ENCOUNTER — Telehealth: Payer: Self-pay

## 2019-09-01 NOTE — Telephone Encounter (Signed)
He has appt with Dr. Andree Elk tomorrow and he wants to know if you got all the prior authorization straight for his next refill. He said its a simple process according to the pharmacist and he shouldn't have to keep calling, and waiting for his medicine. HE would like for someone to call him back and let him know it has been done.

## 2019-09-01 NOTE — Telephone Encounter (Signed)
This medication has been prior authorized . It is good until 07/21. Patient has been called and informed.

## 2019-09-02 ENCOUNTER — Ambulatory Visit: Payer: Medicaid Other | Attending: Anesthesiology | Admitting: Anesthesiology

## 2019-09-02 ENCOUNTER — Other Ambulatory Visit: Payer: Self-pay

## 2019-09-02 ENCOUNTER — Encounter: Payer: Self-pay | Admitting: Anesthesiology

## 2019-09-02 DIAGNOSIS — M5136 Other intervertebral disc degeneration, lumbar region: Secondary | ICD-10-CM

## 2019-09-02 DIAGNOSIS — M5431 Sciatica, right side: Secondary | ICD-10-CM

## 2019-09-02 DIAGNOSIS — G8929 Other chronic pain: Secondary | ICD-10-CM

## 2019-09-02 DIAGNOSIS — G894 Chronic pain syndrome: Secondary | ICD-10-CM | POA: Diagnosis not present

## 2019-09-02 DIAGNOSIS — F1190 Opioid use, unspecified, uncomplicated: Secondary | ICD-10-CM

## 2019-09-02 DIAGNOSIS — M5441 Lumbago with sciatica, right side: Secondary | ICD-10-CM

## 2019-09-02 DIAGNOSIS — M5432 Sciatica, left side: Secondary | ICD-10-CM

## 2019-09-02 DIAGNOSIS — M5442 Lumbago with sciatica, left side: Secondary | ICD-10-CM

## 2019-09-02 DIAGNOSIS — F1910 Other psychoactive substance abuse, uncomplicated: Secondary | ICD-10-CM | POA: Diagnosis not present

## 2019-09-02 DIAGNOSIS — M961 Postlaminectomy syndrome, not elsewhere classified: Secondary | ICD-10-CM

## 2019-09-02 DIAGNOSIS — M47816 Spondylosis without myelopathy or radiculopathy, lumbar region: Secondary | ICD-10-CM

## 2019-09-02 MED ORDER — DURAGESIC-50 50 MCG/HR TD PT72: 1.0000 | MEDICATED_PATCH | TRANSDERMAL | 0 refills | Status: DC

## 2019-09-02 NOTE — Progress Notes (Signed)
Virtual Visit via Telephone Note  I connected with Jeremy Long on 09/02/19 at  2:00 PM EST by telephone and verified that I am speaking with the correct person using two identifiers.  Location: Patient: Home Provider: Pain control center   I discussed the limitations, risks, security and privacy concerns of performing an evaluation and management service by telephone and the availability of in person appointments. I also discussed with the patient that there may be a patient responsible charge related to this service. The patient expressed understanding and agreed to proceed.   History of Present Illness: I spoke with Jeremy Long today via telephone as he was unable to do the video portion of the virtual conference.  He reports that his low back pain is feeling some better with the Duragesic patch.  He generally breaks through on day 2 and the pain becomes severe on day 3.  On day one of the patch he gets good relief he reports.  No side effects reported.  The quality characteristic and distribution of his pain are otherwise unchanged.  The severity of his low back pain is better however he has had some hip pain recently and is inquiring as to whether this could be evaluated by an orthopedist.  No weakness to the lower extremities or change from his baseline are noted otherwise.   Observations/Objective:  Current Outpatient Medications:  .  [START ON 09/06/2019] DURAGESIC 50 MCG/HR, Place 1 patch onto the skin every other day., Disp: 15 patch, Rfl: 0 .  lisinopril (ZESTRIL) 10 MG tablet, Take 10 mg by mouth daily., Disp: , Rfl:  .  LORazepam (ATIVAN) 1 MG tablet, Take 1 mg by mouth 3 (three) times daily as needed., Disp: , Rfl: 0 .  naproxen (NAPROSYN) 250 MG tablet, Take 1 tablet (250 mg total) by mouth 3 (three) times daily with meals. (Patient not taking: Reported on 07/09/2019), Disp: 30 tablet, Rfl: 0 .  predniSONE (DELTASONE) 50 MG tablet, Take one 50 mg tablet once a day for the  next five days. (Patient not taking: Reported on 07/09/2019), Disp: 5 tablet, Rfl: 0 .  sildenafil (REVATIO) 20 MG tablet, TAKE 1 - 3 TABLETS AS DIRECTED., Disp: , Rfl: 3  Assessment and Plan:  1. Chronic pain syndrome   2. Chronic, continuous use of opioids   3. DDD (degenerative disc disease), lumbar   4. Drug abuse ([place])   5. Facet arthritis of lumbar region   6. Post laminectomy syndrome   7. Bilateral sciatica   8. Facet arthropathy, lumbar   9. Chronic bilateral low back pain without sciatica   Based on our discussion today and upon review of the [facility] practitioner database information I think it would be appropriate to increase his Duragesic patch to an every other day basis.  This would require an increase to 15 patches per month.  This would be for a higher dose opioid maintenance therapy which I feel is appropriate considering the severity of his underlying pathology and his current response to treatment.  We have talked about the risks and benefits of chronic opioid maintenance therapy in detail.  We will schedule him for return to clinic in 1 month for reevaluation.  I have encouraged him to follow-up with orthopedics for an evaluation of his underlying hip status and to continue with his primary care physicians for his baseline medical care. Follow Up Instructions:    I discussed the assessment and treatment plan with the patient. The patient was provided  an opportunity to ask questions and all were answered. The patient agreed with the plan and demonstrated an understanding of the instructions.   The patient was advised to call back or seek an in-person evaluation if the symptoms worsen or if the condition fails to improve as anticipated.  I provided 30 minutes of non-face-to-face time during this encounter.   Molli Barrows, MD Home

## 2019-09-05 ENCOUNTER — Other Ambulatory Visit: Payer: Self-pay | Admitting: Cardiology

## 2019-09-05 DIAGNOSIS — R0602 Shortness of breath: Secondary | ICD-10-CM

## 2019-09-05 DIAGNOSIS — R079 Chest pain, unspecified: Secondary | ICD-10-CM

## 2019-09-10 ENCOUNTER — Ambulatory Visit: Admission: RE | Admit: 2019-09-10 | Payer: Medicaid Other | Source: Ambulatory Visit

## 2019-09-11 ENCOUNTER — Other Ambulatory Visit: Payer: Self-pay

## 2019-09-11 ENCOUNTER — Encounter
Admission: RE | Admit: 2019-09-11 | Discharge: 2019-09-11 | Disposition: A | Discharge status: Home / Self Care | Payer: Medicaid Other | Source: Ambulatory Visit | Attending: Cardiology | Admitting: Cardiology

## 2019-09-11 DIAGNOSIS — R0602 Shortness of breath: Secondary | ICD-10-CM | POA: Diagnosis present

## 2019-09-11 DIAGNOSIS — R079 Chest pain, unspecified: Secondary | ICD-10-CM | POA: Diagnosis present

## 2019-09-11 MED ORDER — REGADENOSON 0.4 MG/5ML IV SOLN
0.4000 mg | Freq: Once | INTRAVENOUS | Status: AC
  Administered 2019-09-11: 11:00:00 0.4 mg via INTRAVENOUS

## 2019-09-11 MED ORDER — TECHNETIUM TC 99M TETROFOSMIN IV KIT
30.9000 | PACK | Freq: Once | INTRAVENOUS | Status: AC | PRN
  Administered 2019-09-11: 11:00:00 30.9 via INTRAVENOUS

## 2019-09-11 MED ORDER — TECHNETIUM TC 99M TETROFOSMIN IV KIT
10.1520 | PACK | Freq: Once | INTRAVENOUS | Status: AC | PRN
  Administered 2019-09-11: 09:00:00 10.152 via INTRAVENOUS

## 2019-09-14 LAB — NM MYOCAR MULTI W/SPECT W/WALL MOTION / EF
Estimated workload: 1 METS
Exercise duration (min): 1 min
Exercise duration (sec): 3 s
LV dias vol: 88 mL (ref 62–150)
LV sys vol: 34 mL
Peak HR: 84 {beats}/min
Percent HR: 50 %
Rest HR: 74 {beats}/min
SDS: 0
SRS: 0
SSS: 0
TID: 0.94

## 2019-09-26 ENCOUNTER — Other Ambulatory Visit: Payer: Self-pay | Admitting: Student

## 2019-09-26 ENCOUNTER — Other Ambulatory Visit: Payer: Self-pay | Admitting: Gastroenterology

## 2019-09-26 DIAGNOSIS — G8929 Other chronic pain: Secondary | ICD-10-CM

## 2019-09-26 DIAGNOSIS — R748 Abnormal levels of other serum enzymes: Secondary | ICD-10-CM

## 2019-09-29 ENCOUNTER — Encounter: Payer: Self-pay | Admitting: Anesthesiology

## 2019-09-29 ENCOUNTER — Other Ambulatory Visit: Payer: Self-pay

## 2019-09-29 ENCOUNTER — Ambulatory Visit: Payer: Medicaid Other | Attending: Anesthesiology | Admitting: Anesthesiology

## 2019-09-29 DIAGNOSIS — M5136 Other intervertebral disc degeneration, lumbar region: Secondary | ICD-10-CM | POA: Diagnosis not present

## 2019-09-29 DIAGNOSIS — M545 Low back pain, unspecified: Secondary | ICD-10-CM

## 2019-09-29 DIAGNOSIS — G894 Chronic pain syndrome: Secondary | ICD-10-CM

## 2019-09-29 DIAGNOSIS — Z79891 Long term (current) use of opiate analgesic: Secondary | ICD-10-CM | POA: Diagnosis not present

## 2019-09-29 DIAGNOSIS — M5432 Sciatica, left side: Secondary | ICD-10-CM

## 2019-09-29 DIAGNOSIS — G8929 Other chronic pain: Secondary | ICD-10-CM

## 2019-09-29 DIAGNOSIS — F1910 Other psychoactive substance abuse, uncomplicated: Secondary | ICD-10-CM

## 2019-09-29 DIAGNOSIS — M47816 Spondylosis without myelopathy or radiculopathy, lumbar region: Secondary | ICD-10-CM

## 2019-09-29 DIAGNOSIS — M5431 Sciatica, right side: Secondary | ICD-10-CM

## 2019-09-29 DIAGNOSIS — M961 Postlaminectomy syndrome, not elsewhere classified: Secondary | ICD-10-CM

## 2019-09-29 DIAGNOSIS — F1190 Opioid use, unspecified, uncomplicated: Secondary | ICD-10-CM

## 2019-09-29 MED ORDER — DURAGESIC-50 50 MCG/HR TD PT72: 1.0000 | MEDICATED_PATCH | TRANSDERMAL | 0 refills | Status: DC

## 2019-09-29 NOTE — Progress Notes (Signed)
Virtual Visit via Telephone Note  I connected with Jeremy Long on 09/29/19 at  1:30 PM EDT by telephone and verified that I am speaking with the correct person using two identifiers.  Location: Patient: Home Provider: Pain control center   I discussed the limitations, risks, security and privacy concerns of performing an evaluation and management service by telephone and the availability of in person appointments. I also discussed with the patient that there may be a patient responsible charge related to this service. The patient expressed understanding and agreed to proceed.   History of Present Illness: I spoke with Jeremy Long via telephone as he was unable to do the video portion of the virtual conferencing.  He reports that the increase to every other day on the Duragesic patch has been effective at helping to relieve much of his pain syndrome.  No side effects reported with the medication but he is getting better relief.  He states that he is sleeping better and able to be more active during the day.  He is complaining of some considerable hip pain and is going to see orthopedics for this.  Otherwise the quality characteristic and distribution of his pain have been stable in nature.  No side effects with the medication are reported and he is getting good relief with this.  Unfortunately he has failed conservative therapy historically.  He has tried to be active with his stretching strengthening exercises with limited success he reports.    Observations/Objective:  Current Outpatient Medications:  .  [START ON 10/02/2019] DURAGESIC 50 MCG/HR, Place 1 patch onto the skin every other day., Disp: 15 patch, Rfl: 0 .  lisinopril (ZESTRIL) 10 MG tablet, Take 10 mg by mouth daily., Disp: , Rfl:  .  LORazepam (ATIVAN) 1 MG tablet, Take 1 mg by mouth 3 (three) times daily as needed., Disp: , Rfl: 0 .  naproxen (NAPROSYN) 250 MG tablet, Take 1 tablet (250 mg total) by mouth 3 (three) times  daily with meals. (Patient not taking: Reported on 07/09/2019), Disp: 30 tablet, Rfl: 0 .  predniSONE (DELTASONE) 50 MG tablet, Take one 50 mg tablet once a day for the next five days. (Patient not taking: Reported on 07/09/2019), Disp: 5 tablet, Rfl: 0 .  sildenafil (REVATIO) 20 MG tablet, TAKE 1 - 3 TABLETS AS DIRECTED., Disp: , Rfl: 3  Assessment and Plan: 1. Chronic pain syndrome   2. Chronic, continuous use of opioids   3. DDD (degenerative disc disease), lumbar   4. Drug abuse ([place])   5. Facet arthritis of lumbar region   6. Post laminectomy syndrome   7. Bilateral sciatica   8. Facet arthropathy, lumbar   9. Chronic bilateral low back pain without sciatica   Based on our discussion today upon review of the [facility] practitioner database information going to refill his medication for March 18.  This is early for him but during conversation he reports that several of the patches were unsuccessful in application secondary to the adhesive which became a problem.  He has since remedied this.  I have talked to him about the need to keep these patches for monitoring.  His last UDS was appropriate and otherwise he appears compliant with his regimen.  We will schedule him for a 1 month return and he is instructed to continue follow-up with his primary care physicians for his baseline medical care.  Follow Up Instructions:    I discussed the assessment and treatment plan with the patient. The  patient was provided an opportunity to ask questions and all were answered. The patient agreed with the plan and demonstrated an understanding of the instructions.   The patient was advised to call back or seek an in-person evaluation if the symptoms worsen or if the condition fails to improve as anticipated.  I provided 30 minutes of non-face-to-face time during this encounter.   Molli Barrows, MD

## 2019-10-07 ENCOUNTER — Other Ambulatory Visit: Payer: Self-pay

## 2019-10-07 ENCOUNTER — Ambulatory Visit
Admission: RE | Admit: 2019-10-07 | Discharge: 2019-10-07 | Disposition: A | Discharge status: Home / Self Care | Payer: Medicaid Other | Source: Ambulatory Visit | Attending: Gastroenterology | Admitting: Gastroenterology

## 2019-10-07 ENCOUNTER — Ambulatory Visit: Admission: RE | Admit: 2019-10-07 | Payer: Medicaid Other | Source: Ambulatory Visit

## 2019-10-07 ENCOUNTER — Ambulatory Visit
Admission: RE | Admit: 2019-10-07 | Discharge: 2019-10-07 | Disposition: A | Discharge status: Home / Self Care | Payer: Medicaid Other | Source: Ambulatory Visit | Attending: Student | Admitting: Student

## 2019-10-07 DIAGNOSIS — G8929 Other chronic pain: Secondary | ICD-10-CM | POA: Diagnosis present

## 2019-10-07 DIAGNOSIS — M545 Low back pain: Secondary | ICD-10-CM | POA: Insufficient documentation

## 2019-10-07 DIAGNOSIS — R748 Abnormal levels of other serum enzymes: Secondary | ICD-10-CM | POA: Diagnosis present

## 2019-10-13 ENCOUNTER — Other Ambulatory Visit: Payer: Self-pay | Admitting: Student

## 2019-10-13 DIAGNOSIS — G8929 Other chronic pain: Secondary | ICD-10-CM

## 2019-10-13 DIAGNOSIS — M545 Low back pain, unspecified: Secondary | ICD-10-CM

## 2019-10-24 ENCOUNTER — Other Ambulatory Visit: Payer: Self-pay

## 2019-10-24 ENCOUNTER — Ambulatory Visit
Admission: RE | Admit: 2019-10-24 | Discharge: 2019-10-24 | Disposition: A | Discharge status: Home / Self Care | Payer: Medicaid Other | Source: Ambulatory Visit | Attending: Student | Admitting: Student

## 2019-10-24 DIAGNOSIS — M545 Low back pain, unspecified: Secondary | ICD-10-CM

## 2019-10-24 DIAGNOSIS — G8929 Other chronic pain: Secondary | ICD-10-CM | POA: Insufficient documentation

## 2019-10-27 ENCOUNTER — Encounter: Payer: Self-pay | Admitting: Anesthesiology

## 2019-10-27 ENCOUNTER — Ambulatory Visit: Payer: Medicaid Other | Attending: Anesthesiology | Admitting: Anesthesiology

## 2019-10-27 ENCOUNTER — Other Ambulatory Visit: Payer: Self-pay

## 2019-10-27 DIAGNOSIS — M25551 Pain in right hip: Secondary | ICD-10-CM

## 2019-10-27 DIAGNOSIS — M5432 Sciatica, left side: Secondary | ICD-10-CM

## 2019-10-27 DIAGNOSIS — F1190 Opioid use, unspecified, uncomplicated: Secondary | ICD-10-CM | POA: Diagnosis not present

## 2019-10-27 DIAGNOSIS — F1910 Other psychoactive substance abuse, uncomplicated: Secondary | ICD-10-CM | POA: Diagnosis not present

## 2019-10-27 DIAGNOSIS — M961 Postlaminectomy syndrome, not elsewhere classified: Secondary | ICD-10-CM

## 2019-10-27 DIAGNOSIS — M5136 Other intervertebral disc degeneration, lumbar region: Secondary | ICD-10-CM | POA: Diagnosis not present

## 2019-10-27 DIAGNOSIS — G894 Chronic pain syndrome: Secondary | ICD-10-CM | POA: Diagnosis not present

## 2019-10-27 DIAGNOSIS — M47816 Spondylosis without myelopathy or radiculopathy, lumbar region: Secondary | ICD-10-CM

## 2019-10-27 DIAGNOSIS — M25552 Pain in left hip: Secondary | ICD-10-CM

## 2019-10-27 DIAGNOSIS — M5431 Sciatica, right side: Secondary | ICD-10-CM

## 2019-10-27 HISTORY — DX: Pain in right hip: M25.551

## 2019-10-27 MED ORDER — DURAGESIC-50 50 MCG/HR TD PT72: 1.0000 | MEDICATED_PATCH | TRANSDERMAL | 0 refills | Status: DC

## 2019-10-27 MED ORDER — FENTANYL 50 MCG/HR TD PT72: 1.0000 | MEDICATED_PATCH | TRANSDERMAL | 0 refills | Status: DC

## 2019-10-27 MED ORDER — METHOCARBAMOL 750 MG PO TABS: 750.0000 mg | ORAL_TABLET | Freq: Two times a day (BID) | ORAL | 1 refills | Status: AC | PRN

## 2019-10-27 NOTE — Progress Notes (Signed)
Virtual Visit via Telephone Note  I connected with Jeremy Long on 10/27/19 at 12:00 PM EDT by telephone and verified that I am speaking with the correct person using two identifiers.  Location: Patient: Home Provider: Pain control center   I discussed the limitations, risks, security and privacy concerns of performing an evaluation and management service by telephone and the availability of in person appointments. I also discussed with the patient that there may be a patient responsible charge related to this service. The patient expressed understanding and agreed to proceed.   History of Present Illness: I spoke with Jeremy Long today via telephone as he was unable to do the video portion of the virtual conference.  He reports that he has been doing better with the Duragesic patch at the 50 mcg strength every other day.  This is keeping his pain generally under excellent control the first day but he does have breakthrough pain on day 2.  He has significant low back spasming and this is worse with prolonged standing or walking out to his mailbox.  He is not doing any physical therapy at this point.  He is also having some bilateral hip pain which has been evaluated by orthopedics and he had some hip x-rays that we reviewed today online as well.  His strength in the lower extremities is well preserved and he sleeping better with the medications other than the intermittent back spasming that he is reporting.  In the past he has had epidural steroid injections and is not interested in proceeding with repeat injection.  His bowel and bladder function has been stable and based on his description today he is getting good relief with the opioid medication with no side effects and reporting improved overall functional status.    Observations/Objective:   Current Outpatient Medications:  .  [START ON 10/31/2019] DURAGESIC 50 MCG/HR, Place 1 patch onto the skin every other day., Disp: 15 patch, Rfl:  0 .  lisinopril (ZESTRIL) 10 MG tablet, Take 10 mg by mouth daily., Disp: , Rfl:  .  LORazepam (ATIVAN) 1 MG tablet, Take 1 mg by mouth 3 (three) times daily as needed., Disp: , Rfl: 0 .  naproxen (NAPROSYN) 250 MG tablet, Take 1 tablet (250 mg total) by mouth 3 (three) times daily with meals. (Patient not taking: Reported on 07/09/2019), Disp: 30 tablet, Rfl: 0 .  predniSONE (DELTASONE) 50 MG tablet, Take one 50 mg tablet once a day for the next five days. (Patient not taking: Reported on 07/09/2019), Disp: 5 tablet, Rfl: 0 .  sildenafil (REVATIO) 20 MG tablet, TAKE 1 - 3 TABLETS AS DIRECTED., Disp: , Rfl: 3 Assessment and Plan: 1. Chronic pain syndrome   2. Chronic, continuous use of opioids   3. DDD (degenerative disc disease), lumbar   4. Drug abuse ([place])   5. Facet arthritis of lumbar region   6. Post laminectomy syndrome   7. Bilateral sciatica   8. Facet arthropathy, lumbar   9. Bilateral hip pain   Based on our discussion today and upon review of the [facility] practitioner database information I feel it is appropriate to refill his medication for the Duragesic patch at April 16 and May 16.  We will schedule him for 58-month return to clinic.  In the meantime he will be due for urine drug screen test at some point.  In reference to his hip pain I do not feel he is a candidate for any type of interventional therapy as he  reports no previous success with previous epidural or caudal epidural injections.  I have encouraged him to continue with his NSAIDs if these are tolerable and continue with his Duragesic patches.  I will start him on Robaxin for some of the muscle spasms at twice a day dosing with a 750 mg tablet.  He is instructed to contact us should he have any problem with his regimen and we are scheduling him for 103-month return to clinic.  He is also instructed to follow-up with his orthopedic doctors and primary care physicians for his baseline medical care  Follow Up  Instructions:    I discussed the assessment and treatment plan with the patient. The patient was provided an opportunity to ask questions and all were answered. The patient agreed with the plan and demonstrated an understanding of the instructions.   The patient was advised to call back or seek an in-person evaluation if the symptoms worsen or if the condition fails to improve as anticipated.  I provided.  30 minutes of non-face-to-face time during this encounter.   Molli Barrows, MD

## 2019-11-03 ENCOUNTER — Encounter: Payer: Medicaid Other | Admitting: Anesthesiology

## 2019-11-07 ENCOUNTER — Telehealth: Payer: Self-pay | Admitting: Anesthesiology

## 2019-11-07 NOTE — Telephone Encounter (Signed)
Pt called to let us know that he was prescribed twelve 5mg  pecrocets by his dentist because he had a tooth pulled today.

## 2019-11-07 NOTE — Telephone Encounter (Signed)
Noted in chart under medications

## 2019-12-01 ENCOUNTER — Telehealth: Payer: Self-pay | Admitting: Anesthesiology

## 2019-12-01 NOTE — Telephone Encounter (Signed)
Patient called back asking if the PA has been sent in?

## 2019-12-01 NOTE — Telephone Encounter (Signed)
Called pharm. They stated that the patient would need a PA on his patch.

## 2019-12-01 NOTE — Telephone Encounter (Signed)
PA done by KT ?

## 2019-12-01 NOTE — Telephone Encounter (Signed)
Patient states pharmacy told him they could not file May script with his insurance due to it was not filled out to send to insurance. Please call pharmacy and see what is needed. Patient was supposed to fill on Sunday 5-16.

## 2019-12-22 ENCOUNTER — Encounter: Payer: Self-pay | Admitting: Anesthesiology

## 2019-12-22 ENCOUNTER — Other Ambulatory Visit: Payer: Self-pay

## 2019-12-22 ENCOUNTER — Ambulatory Visit: Payer: Medicaid Other | Admitting: Anesthesiology

## 2019-12-22 ENCOUNTER — Other Ambulatory Visit
Admission: RE | Admit: 2019-12-22 | Discharge: 2019-12-22 | Disposition: A | Discharge status: Home / Self Care | Payer: Medicaid Other | Source: Ambulatory Visit | Attending: Internal Medicine | Admitting: Internal Medicine

## 2019-12-22 VITALS — BP 131/86 | HR 76 | Temp 97.7°F | Resp 18 | Ht 67.0 in | Wt 280.0 lb

## 2019-12-22 DIAGNOSIS — G894 Chronic pain syndrome: Secondary | ICD-10-CM | POA: Diagnosis not present

## 2019-12-22 DIAGNOSIS — M47896 Other spondylosis, lumbar region: Secondary | ICD-10-CM | POA: Insufficient documentation

## 2019-12-22 DIAGNOSIS — M5432 Sciatica, left side: Secondary | ICD-10-CM | POA: Insufficient documentation

## 2019-12-22 DIAGNOSIS — M25551 Pain in right hip: Secondary | ICD-10-CM | POA: Insufficient documentation

## 2019-12-22 DIAGNOSIS — Z5181 Encounter for therapeutic drug level monitoring: Secondary | ICD-10-CM | POA: Insufficient documentation

## 2019-12-22 DIAGNOSIS — Z20822 Contact with and (suspected) exposure to covid-19: Secondary | ICD-10-CM | POA: Insufficient documentation

## 2019-12-22 DIAGNOSIS — M549 Dorsalgia, unspecified: Secondary | ICD-10-CM | POA: Diagnosis present

## 2019-12-22 DIAGNOSIS — M25552 Pain in left hip: Secondary | ICD-10-CM

## 2019-12-22 DIAGNOSIS — M961 Postlaminectomy syndrome, not elsewhere classified: Secondary | ICD-10-CM | POA: Insufficient documentation

## 2019-12-22 DIAGNOSIS — F1190 Opioid use, unspecified, uncomplicated: Secondary | ICD-10-CM

## 2019-12-22 DIAGNOSIS — F1910 Other psychoactive substance abuse, uncomplicated: Secondary | ICD-10-CM | POA: Diagnosis not present

## 2019-12-22 DIAGNOSIS — M47816 Spondylosis without myelopathy or radiculopathy, lumbar region: Secondary | ICD-10-CM

## 2019-12-22 DIAGNOSIS — M5431 Sciatica, right side: Secondary | ICD-10-CM | POA: Insufficient documentation

## 2019-12-22 DIAGNOSIS — Z7984 Long term (current) use of oral hypoglycemic drugs: Secondary | ICD-10-CM | POA: Insufficient documentation

## 2019-12-22 DIAGNOSIS — G8929 Other chronic pain: Secondary | ICD-10-CM

## 2019-12-22 DIAGNOSIS — Z791 Long term (current) use of non-steroidal anti-inflammatories (NSAID): Secondary | ICD-10-CM | POA: Insufficient documentation

## 2019-12-22 DIAGNOSIS — Z79891 Long term (current) use of opiate analgesic: Secondary | ICD-10-CM | POA: Insufficient documentation

## 2019-12-22 DIAGNOSIS — Z79899 Other long term (current) drug therapy: Secondary | ICD-10-CM | POA: Insufficient documentation

## 2019-12-22 DIAGNOSIS — M5136 Other intervertebral disc degeneration, lumbar region: Secondary | ICD-10-CM | POA: Diagnosis not present

## 2019-12-22 DIAGNOSIS — Z7952 Long term (current) use of systemic steroids: Secondary | ICD-10-CM | POA: Insufficient documentation

## 2019-12-22 DIAGNOSIS — M545 Low back pain: Secondary | ICD-10-CM

## 2019-12-22 LAB — SARS CORONAVIRUS 2 (TAT 6-24 HRS): SARS Coronavirus 2: NEGATIVE

## 2019-12-22 MED ORDER — FENTANYL 75 MCG/HR TD PT72: 1.0000 | MEDICATED_PATCH | TRANSDERMAL | 0 refills | Status: DC

## 2019-12-22 NOTE — Patient Instructions (Signed)
A prescription for Duragesic patch was sent to pharmacy.

## 2019-12-22 NOTE — Progress Notes (Signed)
Safety precautions to be maintained throughout the outpatient stay will include: orient to surroundings, keep bed in low position, maintain call bell within reach at all times, provide assistance with transfer out of bed and ambulation.  

## 2019-12-23 ENCOUNTER — Encounter: Payer: Self-pay | Admitting: Internal Medicine

## 2019-12-23 NOTE — Progress Notes (Signed)
Subjective:  Patient ID: Jeremy Long, male    DOB: [birth year]  Age: 54 y.o. MRN: [id number]  CC: Back Pain (lower)   Procedure: None  HPI Jeremy Long presents for reevaluation.  He was last seen a few months ago and has complained that his pain has been more problematic over the past 2 to 3 months.  He gets decent relief with the Duragesic patch at the 50 mcg level however continues to have breakthrough pain.  This was similar to what he initially experienced taking it every third day.  Using it every other day does help but the degree of relief is suboptimal he reports.  He is more functional with the medications and has been more active but feels limited in his efforts at weight loss secondary to the persistent pain severity.  He has been on higher doses of opioids in the past with more success.  The quality characteristic and distribution of the pain have been stable with no reported changes.  Otherwise he is in his usual state of health.  No side effects are reported with the opioid medications and based on our discussion and his nursing assessment sheet he continues to derive good functional lifestyle improvement with the medicines.  Outpatient Medications Prior to Visit  Medication Sig Dispense Refill  . allopurinol (ZYLOPRIM) 100 MG tablet TAKE 1 TABLET BY MOUTH EVERY DAY    . fentaNYL (DURAGESIC) 50 MCG/HR Place 1 patch onto the skin every other day. 15 patch 0  . glipiZIDE (GLUCOTROL XL) 10 MG 24 hr tablet Take by mouth.    . Glycopyrrolate-Formoterol (BEVESPI AEROSPHERE) 9-4.8 MCG/ACT AERO Inhale into the lungs.    Marland Kitchen lisinopril (ZESTRIL) 10 MG tablet Take 10 mg by mouth daily.    . varenicline (CHANTIX) 1 MG tablet TAKE 1 TABLET (1 MG TOTAL) BY MOUTH 2 (TWO) TIMES DAILY FOR 30 DAYS    . DURAGESIC 50 MCG/HR Place 1 patch onto the skin every other day. 15 patch 0  . LORazepam (ATIVAN) 1 MG tablet Take 1 mg by mouth 3 (three) times daily as needed.  0  . naproxen (NAPROSYN)  250 MG tablet Take 1 tablet (250 mg total) by mouth 3 (three) times daily with meals. (Patient not taking: Reported on 07/09/2019) 30 tablet 0  . predniSONE (DELTASONE) 50 MG tablet Take one 50 mg tablet once a day for the next five days. (Patient not taking: Reported on 07/09/2019) 5 tablet 0  . sildenafil (REVATIO) 20 MG tablet TAKE 1 - 3 TABLETS AS DIRECTED.  3   No facility-administered medications prior to visit.    Review of Systems CNS: No confusion or sedation Cardiac: No angina or palpitations GI: No abdominal pain or constipation Constitutional: No nausea vomiting fevers or chills  Objective:  BP 131/86   Pulse 76   Temp 97.7 F (36.5 C) (Temporal)   Resp 18   Ht 5\' 7"  (1.702 m)   Wt 280 lb (127 kg)   SpO2 96%   BMI 43.85 kg/m    BP Readings from Last 3 Encounters:  12/22/19 131/86  07/09/19 (!) 180/98  05/19/19 (!) 164/112     Wt Readings from Last 3 Encounters:  12/22/19 280 lb (127 kg)  07/09/19 273 lb (123.8 kg)  05/19/19 260 lb (117.9 kg)     Physical Exam Pt is alert and oriented PERRL EOMI HEART IS RRR no murmur or rub LCTA no wheezing or rales MUSCULOSKELETAL reveals some paraspinous muscle tenderness but no  overt trigger points.  He has an antalgic gait.  He ambulates with some difficulty and goes from seated to standing with limited difficulty.  He reports significant pain with rotational movements at the low back and various activities.  His muscle tone and bulk to the lower extremities is at baseline.  Labs  No results found for: HGBA1C Lab Results  Component Value Date   CREATININE 1.00 11/22/2017    -------------------------------------------------------------------------------------------------------------------- Lab Results  Component Value Date   WBC 11.8 (H) 11/22/2017   HGB 15.7 11/22/2017   HCT 45.8 11/22/2017   PLT 216 11/22/2017   GLUCOSE 126 (H) 11/22/2017   NA 134 (L) 11/22/2017   K 4.9 11/22/2017   CL 101 11/22/2017    CREATININE 1.00 11/22/2017   BUN 22 (H) 11/22/2017   CO2 26 11/22/2017    --------------------------------------------------------------------------------------------------------------------- No results found.   Assessment & Plan:   Jeremy Long was seen today for back pain.  Diagnoses and all orders for this visit:  Chronic pain syndrome -     ToxASSURE Select 13 (MW), Urine  Chronic, continuous use of opioids -     ToxASSURE Select 13 (MW), Urine  DDD (degenerative disc disease), lumbar  Drug abuse (HCC)  Facet arthritis of lumbar region  Post laminectomy syndrome  Bilateral sciatica  Facet arthropathy, lumbar  Bilateral hip pain  Chronic bilateral low back pain without sciatica  Other orders -     fentaNYL (DURAGESIC) 75 MCG/HR; Place 1 patch onto the skin every other day.        ----------------------------------------------------------------------------------------------------------------------  Problem List Items Addressed This Visit      Unprioritized   Bilateral hip pain   Chronic pain syndrome - Primary   Relevant Medications   fentaNYL (DURAGESIC) 75 MCG/HR (Start on 01/01/2020)   Other Relevant Orders   ToxASSURE Select 13 (MW), Urine   Chronic, continuous use of opioids   Relevant Orders   ToxASSURE Select 13 (MW), Urine   DDD (degenerative disc disease), lumbar   Relevant Medications   fentaNYL (DURAGESIC) 75 MCG/HR (Start on 01/01/2020)   allopurinol (ZYLOPRIM) 100 MG tablet   Drug abuse (HCC)   Facet arthropathy, lumbar   Low back pain   Relevant Medications   fentaNYL (DURAGESIC) 75 MCG/HR (Start on 01/01/2020)   Post laminectomy syndrome   Sciatica   Relevant Medications   varenicline (CHANTIX) 1 MG tablet    Other Visit Diagnoses    Facet arthritis of lumbar region       Relevant Medications   fentaNYL (DURAGESIC) 75 MCG/HR (Start on 01/01/2020)   allopurinol (ZYLOPRIM) 100 MG tablet         ----------------------------------------------------------------------------------------------------------------------  1. Chronic pain syndrome A recheck UDS for baseline was requested.  Based on our discussion today I am going to increase his Duragesic to the 75 mcg level to see if we get him better pain control.  We will schedule him for return to clinic in 1 month.  Talked to him about the risks and benefits of chronic opioid management and the potential risks associated with dose increases.  He understands these and will report any problems to [place]. - ToxASSURE Select 13 (MW), Urine  2. Chronic, continuous use of opioids As above - ToxASSURE Select 13 (MW), Urine  3. DDD (degenerative disc disease), lumbar Continue efforts at weight loss and core stretching strengthening as reviewed today.  4. Drug abuse ([place]) This will continue to be monitored.  We have had open discussions regarding this risk  with ongoing opioid administration.  5. Facet arthritis of lumbar region As above   6. Post laminectomy syndrome   7. Bilateral sciatica   8. Facet arthropathy, lumbar   9. Bilateral hip pain   10. Chronic bilateral low back pain without sciatica     ----------------------------------------------------------------------------------------------------------------------  I am having Jeremy Long start on fentaNYL. I am also having him maintain his LORazepam, sildenafil, lisinopril, naproxen, predniSONE, DURAGESIC, fentaNYL, allopurinol, glipiZIDE, Bevespi Aerosphere, and varenicline.   Meds ordered this encounter  Medications  . fentaNYL (DURAGESIC) 75 MCG/HR    Sig: Place 1 patch onto the skin every other day.    Dispense:  15 patch    Refill:  0   Patient's Medications  New Prescriptions   FENTANYL (DURAGESIC) 75 MCG/HR    Place 1 patch onto the skin every other day.  Previous Medications   ALLOPURINOL (ZYLOPRIM) 100 MG TABLET    TAKE 1 TABLET BY MOUTH EVERY  DAY   DURAGESIC 50 MCG/HR    Place 1 patch onto the skin every other day.   FENTANYL (DURAGESIC) 50 MCG/HR    Place 1 patch onto the skin every other day.   GLIPIZIDE (GLUCOTROL XL) 10 MG 24 HR TABLET    Take by mouth.   GLYCOPYRROLATE-FORMOTEROL (BEVESPI AEROSPHERE) 9-4.8 MCG/ACT AERO    Inhale into the lungs.   LISINOPRIL (ZESTRIL) 10 MG TABLET    Take 10 mg by mouth daily.   LORAZEPAM (ATIVAN) 1 MG TABLET    Take 1 mg by mouth 3 (three) times daily as needed.   NAPROXEN (NAPROSYN) 250 MG TABLET    Take 1 tablet (250 mg total) by mouth 3 (three) times daily with meals.   PREDNISONE (DELTASONE) 50 MG TABLET    Take one 50 mg tablet once a day for the next five days.   SILDENAFIL (REVATIO) 20 MG TABLET    TAKE 1 - 3 TABLETS AS DIRECTED.   VARENICLINE (CHANTIX) 1 MG TABLET    TAKE 1 TABLET (1 MG TOTAL) BY MOUTH 2 (TWO) TIMES DAILY FOR 30 DAYS  Modified Medications   No medications on file  Discontinued Medications   No medications on file   ----------------------------------------------------------------------------------------------------------------------  Follow-up: Return in about 1 month (around 01/21/2020) for evaluation, med refill.    Molli Barrows, MD

## 2019-12-24 ENCOUNTER — Ambulatory Visit: Payer: Medicaid Other | Admitting: Anesthesiology

## 2019-12-24 ENCOUNTER — Encounter: Payer: Self-pay | Admitting: Internal Medicine

## 2019-12-24 ENCOUNTER — Other Ambulatory Visit: Payer: Self-pay

## 2019-12-24 ENCOUNTER — Encounter
Admission: RE | Disposition: A | Discharge status: Home / Self Care | Payer: Self-pay | Source: Home / Self Care | Attending: Internal Medicine

## 2019-12-24 ENCOUNTER — Ambulatory Visit
Admission: RE | Admit: 2019-12-24 | Discharge: 2019-12-24 | Disposition: A | Discharge status: Home / Self Care | Payer: Medicaid Other | Attending: Internal Medicine | Admitting: Internal Medicine

## 2019-12-24 DIAGNOSIS — D175 Benign lipomatous neoplasm of intra-abdominal organs: Secondary | ICD-10-CM | POA: Diagnosis not present

## 2019-12-24 DIAGNOSIS — Z7984 Long term (current) use of oral hypoglycemic drugs: Secondary | ICD-10-CM | POA: Diagnosis not present

## 2019-12-24 DIAGNOSIS — Z9981 Dependence on supplemental oxygen: Secondary | ICD-10-CM | POA: Insufficient documentation

## 2019-12-24 DIAGNOSIS — D128 Benign neoplasm of rectum: Secondary | ICD-10-CM | POA: Diagnosis not present

## 2019-12-24 DIAGNOSIS — K640 First degree hemorrhoids: Secondary | ICD-10-CM | POA: Insufficient documentation

## 2019-12-24 DIAGNOSIS — D127 Benign neoplasm of rectosigmoid junction: Secondary | ICD-10-CM | POA: Diagnosis not present

## 2019-12-24 DIAGNOSIS — M109 Gout, unspecified: Secondary | ICD-10-CM | POA: Insufficient documentation

## 2019-12-24 DIAGNOSIS — J449 Chronic obstructive pulmonary disease, unspecified: Secondary | ICD-10-CM | POA: Diagnosis not present

## 2019-12-24 DIAGNOSIS — Z79899 Other long term (current) drug therapy: Secondary | ICD-10-CM | POA: Diagnosis not present

## 2019-12-24 DIAGNOSIS — F17200 Nicotine dependence, unspecified, uncomplicated: Secondary | ICD-10-CM | POA: Diagnosis not present

## 2019-12-24 DIAGNOSIS — G894 Chronic pain syndrome: Secondary | ICD-10-CM | POA: Insufficient documentation

## 2019-12-24 DIAGNOSIS — R195 Other fecal abnormalities: Secondary | ICD-10-CM | POA: Diagnosis present

## 2019-12-24 DIAGNOSIS — D125 Benign neoplasm of sigmoid colon: Secondary | ICD-10-CM | POA: Insufficient documentation

## 2019-12-24 DIAGNOSIS — I10 Essential (primary) hypertension: Secondary | ICD-10-CM | POA: Diagnosis not present

## 2019-12-24 HISTORY — PX: COLONOSCOPY WITH PROPOFOL: SHX5780

## 2019-12-24 SURGERY — COLONOSCOPY WITH PROPOFOL
Anesthesia: General

## 2019-12-24 MED ORDER — PROPOFOL 500 MG/50ML IV EMUL
INTRAVENOUS | Status: DC | PRN
  Administered 2019-12-24: 100 ug/kg/min via INTRAVENOUS

## 2019-12-24 MED ORDER — PROPOFOL 10 MG/ML IV BOLUS
INTRAVENOUS | Status: AC
  Filled 2019-12-24: qty 80

## 2019-12-24 MED ORDER — PROPOFOL 10 MG/ML IV BOLUS
INTRAVENOUS | Status: DC | PRN
Start: 2019-12-24 — End: 2019-12-24
  Administered 2019-12-24: 80 mg via INTRAVENOUS
  Administered 2019-12-24: 30 mg via INTRAVENOUS

## 2019-12-24 MED ORDER — LIDOCAINE HCL (PF) 2 % IJ SOLN
INTRAMUSCULAR | Status: AC
  Filled 2019-12-24: qty 10

## 2019-12-24 MED ORDER — SODIUM CHLORIDE 0.9 % IV SOLN
INTRAVENOUS | Status: DC
  Administered 2019-12-24: 1000 mL via INTRAVENOUS

## 2019-12-24 MED ORDER — LIDOCAINE HCL (CARDIAC) PF 100 MG/5ML IV SOSY
PREFILLED_SYRINGE | INTRAVENOUS | Status: DC | PRN
  Administered 2019-12-24: 100 mg via INTRAVENOUS

## 2019-12-24 NOTE — Interval H&P Note (Signed)
History and Physical Interval Note:  12/24/2019 10:29 AM  Jeremy Long  has presented today for surgery, with the diagnosis of + COLORECTAL CA SCREENING.  The various methods of treatment have been discussed with the patient and family. After consideration of risks, benefits and other options for treatment, the patient has consented to  Procedure(s): COLONOSCOPY WITH PROPOFOL (N/A) as a surgical intervention.  The patient's history has been reviewed, patient examined, no change in status, stable for surgery.  I have reviewed the patient's chart and labs.  Questions were answered to the patient's satisfaction.     [place], [name]

## 2019-12-24 NOTE — Anesthesia Preprocedure Evaluation (Signed)
Anesthesia Evaluation  Patient identified by MRN, date of birth, ID band Patient awake    Reviewed: Allergy & Precautions, NPO status , Patient's Chart, lab work & pertinent test results  History of Anesthesia Complications Negative for: history of anesthetic complications  Airway Mallampati: III  TM Distance: >3 FB Neck ROM: Full    Dental  (+) Poor Dentition   Pulmonary COPD (wears O2 at night),  COPD inhaler, Current Smoker and Patient abstained from smoking.,    breath sounds clear to auscultation- rhonchi (-) wheezing      Cardiovascular hypertension, Pt. on medications (-) CAD, (-) Past MI, (-) Cardiac Stents and (-) CABG  Rhythm:Regular Rate:Normal - Systolic murmurs and - Diastolic murmurs    Neuro/Psych neg Seizures negative neurological ROS  negative psych ROS   GI/Hepatic negative GI ROS, Neg liver ROS,   Endo/Other  diabetes, Oral Hypoglycemic Agents  Renal/GU negative Renal ROS     Musculoskeletal  (+) Arthritis ,   Abdominal (+) + obese,   Peds  Hematology negative hematology ROS (+)   Anesthesia Other Findings Past Medical History: 10/30/2015: Acute hypoxemic respiratory failure ([place]) 10/31/2015: Aspiration pneumonia due to vomit ([place]) 04/26/2017: Back abscess 10/27/2019: Bilateral hip pain No date: Chronic back pain 06/09/2019: Chronic pain syndrome No date: COPD (chronic obstructive pulmonary disease) ([place]) 05/19/2019: DDD (degenerative disc disease), lumbar 05/19/2019: Drug abuse ([place]) 01/23/2018: Essential hypertension 05/19/2019: Facet arthritis of lumbar region No date: Gout 07/28/2011: Low back pain 07/09/2019: Post laminectomy syndrome 05/19/2019: Sciatica 10/30/2015: Somnolence No date: Tobacco use   Reproductive/Obstetrics                             Anesthesia Physical Anesthesia Plan  ASA: III  Anesthesia Plan: General   Post-op Pain Management:     Induction: Intravenous  PONV Risk Score and Plan: 0 and Propofol infusion  Airway Management Planned: Natural Airway  Additional Equipment:   Intra-op Plan:   Post-operative Plan:   Informed Consent: I have reviewed the patients History and Physical, chart, labs and discussed the procedure including the risks, benefits and alternatives for the proposed anesthesia with the patient or authorized representative who has indicated his/her understanding and acceptance.     Dental advisory given  Plan Discussed with: CRNA and Anesthesiologist  Anesthesia Plan Comments:         Anesthesia Quick Evaluation

## 2019-12-24 NOTE — Interval H&P Note (Signed)
History and Physical Interval Note:  12/24/2019 10:19 AM  Jeremy Long  has presented today for surgery, with the diagnosis of + COLORECTAL CA SCREENING.  The various methods of treatment have been discussed with the patient and family. After consideration of risks, benefits and other options for treatment, the patient has consented to  Procedure(s): COLONOSCOPY WITH PROPOFOL (N/A) as a surgical intervention.  The patient's history has been reviewed, patient examined, no change in status, stable for surgery.  I have reviewed the patient's chart and labs.  Questions were answered to the patient's satisfaction.     [place], [place]

## 2019-12-24 NOTE — Transfer of Care (Signed)
Immediate Anesthesia Transfer of Care Note  Patient: Jeremy Long  Procedure(s) Performed: COLONOSCOPY WITH PROPOFOL (N/A )  Patient Location: PACU and Endoscopy Unit  Anesthesia Type:General  Level of Consciousness: awake, oriented, drowsy and patient cooperative  Airway & Oxygen Therapy: Patient Spontanous Breathing and Patient connected to nasal cannula oxygen  Post-op Assessment: Report given to RN and Post -op Vital signs reviewed and stable  Post vital signs: Reviewed and stable  Last Vitals:  Vitals Value Taken Time  BP 130/85 12/24/19 1102  Temp    Pulse 71 12/24/19 1103  Resp 33 12/24/19 1103  SpO2 94 % 12/24/19 1103  Vitals shown include unvalidated device data.  Last Pain:  Vitals:   12/24/19 1102  TempSrc:   PainSc: 0-No pain         Complications: No apparent anesthesia complications

## 2019-12-24 NOTE — Anesthesia Postprocedure Evaluation (Signed)
Anesthesia Post Note  Patient: Jeremy Long  Procedure(s) Performed: COLONOSCOPY WITH PROPOFOL (N/A )  Patient location during evaluation: Endoscopy Anesthesia Type: General Level of consciousness: awake and alert and oriented Pain management: pain level controlled Vital Signs Assessment: post-procedure vital signs reviewed and stable Respiratory status: spontaneous breathing, nonlabored ventilation and respiratory function stable Cardiovascular status: blood pressure returned to baseline and stable Postop Assessment: no signs of nausea or vomiting Anesthetic complications: no     Last Vitals:  Vitals:   12/24/19 0933 12/24/19 1102  BP: (!) 146/92 130/85  Pulse: 78 72  Resp: (!) 24 (!) 23  Temp: (!) 36.2 C   SpO2: 95% 93%    Last Pain:  Vitals:   12/24/19 1102  TempSrc:   PainSc: 0-No pain                 [name]

## 2019-12-24 NOTE — Addendum Note (Signed)
Addendum  created 12/24/19 1639 by Hedda Slade, CRNA   Intraprocedure Event deleted

## 2019-12-24 NOTE — Op Note (Signed)
[facility] Gastroenterology Patient Name: Jeremy Long Procedure Date: 12/24/2019 10:16 AM MRN: [id number] Account #: [id number] Date of Birth: 03-Dec-1965 Admit Type: Outpatient Age: 54 Room: [facility] ENDO ROOM 3 Gender: Male Note Status: Finalized Procedure:             Colonoscopy Indications:           Positive Cologuard test Providers:             Benay Pike. Alice Reichert MD, MD Referring MD:          Baxter Hire, MD (Referring MD) Medicines:             Propofol per Anesthesia Complications:         No immediate complications. Procedure:             Pre-Anesthesia Assessment:                        - The risks and benefits of the procedure and the                         sedation options and risks were discussed with the                         patient. All questions were answered and informed                         consent was obtained.                        - Patient identification and proposed procedure were                         verified prior to the procedure by the nurse. The                         procedure was verified in the procedure room.                        - ASA Grade Assessment: III - A patient with severe                         systemic disease.                        - After reviewing the risks and benefits, the patient                         was deemed in satisfactory condition to undergo the                         procedure.                        After obtaining informed consent, the colonoscope was                         passed under direct vision. Throughout the procedure,                         the patient's blood pressure, pulse, and  oxygen                         saturations were monitored continuously. The                         Colonoscope was introduced through the anus and                         advanced to the the terminal ileum, with                         identification of the appendiceal orifice and IC                 valve. The colonoscopy was performed without                         difficulty. The patient tolerated the procedure well.                         The quality of the bowel preparation was adequate. The                         terminal ileum, ileocecal valve, appendiceal orifice,                         and rectum were photographed. Findings:      The perianal and digital rectal examinations were normal. Pertinent       negatives include normal sphincter tone and no palpable rectal lesions.      Non-bleeding internal hemorrhoids were found during retroflexion. The       hemorrhoids were Grade I (internal hemorrhoids that do not prolapse).      There was a small lipoma, 15 mm in diameter, in the transverse colon.       Biopsies were taken with a cold forceps for histology.      Two semi-pedunculated polyps were found in the sigmoid colon. The polyps       were 5 to 7 mm in size. These polyps were removed with a hot snare.       Resection and retrieval were complete.      Two semi-pedunculated polyps were found in the recto-sigmoid colon. The       polyps were 4 to 6 mm in size. These polyps were removed with a hot       snare. Resection and retrieval were complete.      Three sessile and semi-pedunculated polyps were found in the distal       rectum. The polyps were 5 to 8 mm in size. These polyps were removed       with a hot snare. Resection and retrieval were complete.      The exam was otherwise without abnormality.      The terminal ileum appeared normal.      A single small-mouthed diverticulum was found in the transverse colon. Impression:            - Non-bleeding internal hemorrhoids.                        - Small lipoma in the transverse colon. Biopsied.                        -  Two 5 to 7 mm polyps in the sigmoid colon, removed                         with a hot snare. Resected and retrieved.                        - Two 4 to 6 mm polyps at the recto-sigmoid colon,                          removed with a hot snare. Resected and retrieved.                        - Three 5 to 8 mm polyps in the distal rectum, removed                         with a hot snare. Resected and retrieved.                        - The examination was otherwise normal. Recommendation:        - Patient has a contact number available for                         emergencies. The signs and symptoms of potential                         delayed complications were discussed with the patient.                         Return to normal activities tomorrow. Written                         discharge instructions were provided to the patient.                        - Resume previous diet.                        - Continue present medications.                        - Repeat colonoscopy is recommended for surveillance.                         The colonoscopy date will be determined after                         pathology results from today's exam become available                         for review.                        - Return to GI office PRN.                        - The findings and recommendations were discussed with                         the  patient. Procedure Code(s):     --- Professional ---                        [phone], Colonoscopy, flexible; with removal of                         tumor(s), polyp(s), or other lesion(s) by snare                         technique                        45380, 59, Colonoscopy, flexible; with biopsy, single                         or multiple Diagnosis Code(s):     --- Professional ---                        R19.5, Other fecal abnormalities                        K64.0, First degree hemorrhoids                        K62.1, Rectal polyp                        K63.5, Polyp of colon                        D17.5, Benign lipomatous neoplasm of intra-abdominal                         organs CPT copyright 2019 American Medical Association. All rights  reserved. The codes documented in this report are preliminary and upon coder review may  be revised to meet current compliance requirements. Efrain Sella MD, MD 12/24/2019 11:03:06 AM This report has been signed electronically. Number of Addenda: 0 Note Initiated On: 12/24/2019 10:16 AM Scope Withdrawal Time: 0 hours 16 minutes 38 seconds  Total Procedure Duration: 0 hours 19 minutes 3 seconds  Estimated Blood Loss:  Estimated blood loss: none. Estimated blood loss:                         none. Estimated blood loss: none. Estimated blood                         loss: none.      [facility]

## 2019-12-24 NOTE — H&P (Signed)
Outpatient short stay form Pre-procedure 12/24/2019 10:19 AM Jeremy Long K. Alice Reichert, M.D.  Primary Physician: Harrel Lemon, M.D.  Reason for visit: Positive cologuard test  History of present illness:  54 y/o male presents for diagnostic colonoscopy for positive cologuard test. Patient denies change in bowel habits, rectal bleeding, weight loss or abdominal pain.      Current Facility-Administered Medications:  .  0.9 %  sodium chloride infusion, , Intravenous, Continuous, [place], Benay Pike, MD, Last Rate: 20 mL/hr at 12/24/19 0950, 1,000 mL at 12/24/19 0950  Medications Prior to Admission  Medication Sig Dispense Refill Last Dose  . allopurinol (ZYLOPRIM) 100 MG tablet TAKE 1 TABLET BY MOUTH EVERY DAY   Past Week at Unknown time  . fentaNYL (DURAGESIC) 50 MCG/HR Place 1 patch onto the skin every other day. 15 patch 0 12/23/2019 at Unknown time  . glipiZIDE (GLUCOTROL XL) 10 MG 24 hr tablet Take by mouth.   Past Week at Unknown time  . lisinopril (ZESTRIL) 10 MG tablet Take 10 mg by mouth daily.   Past Week at Unknown time  . DURAGESIC 50 MCG/HR Place 1 patch onto the skin every other day. 15 patch 0   . [START ON 01/01/2020] fentaNYL (DURAGESIC) 75 MCG/HR Place 1 patch onto the skin every other day. 15 patch 0   . Glycopyrrolate-Formoterol (BEVESPI AEROSPHERE) 9-4.8 MCG/ACT AERO Inhale into the lungs.     Marland Kitchen LORazepam (ATIVAN) 1 MG tablet Take 1 mg by mouth 3 (three) times daily as needed.  0   . naproxen (NAPROSYN) 250 MG tablet Take 1 tablet (250 mg total) by mouth 3 (three) times daily with meals. (Patient not taking: Reported on 07/09/2019) 30 tablet 0   . predniSONE (DELTASONE) 50 MG tablet Take one 50 mg tablet once a day for the next five days. (Patient not taking: Reported on 07/09/2019) 5 tablet 0   . sildenafil (REVATIO) 20 MG tablet TAKE 1 - 3 TABLETS AS DIRECTED.  3   . varenicline (CHANTIX) 1 MG tablet TAKE 1 TABLET (1 MG TOTAL) BY MOUTH 2 (TWO) TIMES DAILY FOR 30 DAYS        No  Known Allergies   Past Medical History:  Diagnosis Date  . Acute hypoxemic respiratory failure ([place]) 10/30/2015  . Aspiration pneumonia due to vomit ([place]) 10/31/2015  . Back abscess 04/26/2017  . Bilateral hip pain 10/27/2019  . Chronic back pain   . Chronic pain syndrome 06/09/2019  . COPD (chronic obstructive pulmonary disease) ([place])   . DDD (degenerative disc disease), lumbar 05/19/2019  . Drug abuse ([place]) 05/19/2019  . Essential hypertension 01/23/2018  . Facet arthritis of lumbar region 05/19/2019  . Gout   . Low back pain 07/28/2011  . Post laminectomy syndrome 07/09/2019  . Sciatica 05/19/2019  . Somnolence 10/30/2015  . Tobacco use     Review of systems:  Otherwise negative.    Physical Exam  Gen: Alert, oriented. Appears stated age.  HEENT: [place]/AT. PERRLA. Lungs: CTA, no wheezes. CV: RR nl S1, S2. Abd: soft, benign, no masses. BS+ Ext: No edema. Pulses 2+    Planned procedures: Proceed with colonoscopy. The patient understands the nature of the planned procedure, indications, risks, alternatives and potential complications including but not limited to bleeding, infection, perforation, damage to internal organs and possible oversedation/side effects from anesthesia. The patient agrees and gives consent to proceed.  Please refer to procedure notes for findings, recommendations and patient disposition/instructions.     Jeremy Long K. Alice Reichert, M.D.  Gastroenterology 12/24/2019  10:19 AM

## 2019-12-25 ENCOUNTER — Encounter: Payer: Self-pay | Admitting: Internal Medicine

## 2019-12-25 LAB — SURGICAL PATHOLOGY

## 2019-12-27 LAB — TOXASSURE SELECT 13 (MW), URINE

## 2020-01-08 ENCOUNTER — Other Ambulatory Visit: Payer: Self-pay | Admitting: Gastroenterology

## 2020-01-08 DIAGNOSIS — R0989 Other specified symptoms and signs involving the circulatory and respiratory systems: Secondary | ICD-10-CM

## 2020-01-08 DIAGNOSIS — R053 Chronic cough: Secondary | ICD-10-CM

## 2020-01-08 DIAGNOSIS — R1313 Dysphagia, pharyngeal phase: Secondary | ICD-10-CM

## 2020-01-12 ENCOUNTER — Other Ambulatory Visit: Payer: Self-pay

## 2020-01-12 ENCOUNTER — Encounter: Payer: Self-pay | Admitting: *Deleted

## 2020-01-12 DIAGNOSIS — R519 Headache, unspecified: Secondary | ICD-10-CM | POA: Insufficient documentation

## 2020-01-12 DIAGNOSIS — R5383 Other fatigue: Secondary | ICD-10-CM | POA: Diagnosis not present

## 2020-01-12 DIAGNOSIS — Z5321 Procedure and treatment not carried out due to patient leaving prior to being seen by health care provider: Secondary | ICD-10-CM | POA: Diagnosis not present

## 2020-01-12 DIAGNOSIS — R7309 Other abnormal glucose: Secondary | ICD-10-CM | POA: Insufficient documentation

## 2020-01-12 LAB — BASIC METABOLIC PANEL
Anion gap: 10 (ref 5–15)
BUN: 13 mg/dL (ref 6–20)
CO2: 26 mmol/L (ref 22–32)
Calcium: 8.8 mg/dL — ABNORMAL LOW (ref 8.9–10.3)
Chloride: 94 mmol/L — ABNORMAL LOW (ref 98–111)
Creatinine, Ser: 0.81 mg/dL (ref 0.61–1.24)
GFR calc Af Amer: >60 mL/min (ref >60)
GFR calc non Af Amer: >60 mL/min (ref >60)
Glucose, Bld: 481 mg/dL — ABNORMAL HIGH (ref 70–99)
Potassium: 4.6 mmol/L (ref 3.5–5.1)
Sodium: 130 mmol/L — ABNORMAL LOW (ref 135–145)

## 2020-01-12 LAB — URINALYSIS, COMPLETE (UACMP) WITH MICROSCOPIC
Bacteria, UA: NONE SEEN
Bilirubin Urine: NEGATIVE
Glucose, UA: >=500 mg/dL — AB
Hgb urine dipstick: NEGATIVE
Ketones, ur: NEGATIVE mg/dL
Leukocytes,Ua: NEGATIVE
Nitrite: NEGATIVE
Protein, ur: NEGATIVE mg/dL
Specific Gravity, Urine: 1.022 (ref 1.005–1.030)
Squamous Epithelial / HPF: NONE SEEN (ref 0–5)
WBC, UA: NONE SEEN WBC/hpf (ref 0–5)
pH: 6 (ref 5.0–8.0)

## 2020-01-12 LAB — GLUCOSE, CAPILLARY: Glucose-Capillary: 509 mg/dL (ref 70–99)

## 2020-01-12 LAB — CBC
HCT: 46.4 % (ref 39.0–52.0)
Hemoglobin: 15.7 g/dL (ref 13.0–17.0)
MCH: 30.5 pg (ref 26.0–34.0)
MCHC: 33.8 g/dL (ref 30.0–36.0)
MCV: 90.3 fL (ref 80.0–100.0)
Platelets: 175 10*3/uL (ref 150–400)
RBC: 5.14 MIL/uL (ref 4.22–5.81)
RDW: 13.6 % (ref 11.5–15.5)
WBC: 8.5 10*3/uL (ref 4.0–10.5)
nRBC: 0 % (ref 0.0–0.2)

## 2020-01-12 MED ORDER — SODIUM CHLORIDE 0.9 % IV SOLN: Freq: Once | INTRAVENOUS | Status: AC

## 2020-01-12 NOTE — ED Triage Notes (Signed)
Pt says his blood sugar has been in the 300-400's over the past several days. Reports recently being diagnoses and is on Glipizide. Has had headache, fatigue, blurry vision, urinary frequency, and dry mouth.

## 2020-01-13 ENCOUNTER — Emergency Department
Admission: EM | Admit: 2020-01-13 | Discharge: 2020-01-13 | Disposition: A | Discharge status: Home / Self Care | Payer: Medicaid Other | Attending: Emergency Medicine | Admitting: Emergency Medicine

## 2020-01-13 HISTORY — DX: Type 2 diabetes mellitus without complications: E11.9

## 2020-01-13 LAB — GLUCOSE, CAPILLARY
Glucose-Capillary: 381 mg/dL — ABNORMAL HIGH (ref 70–99)
Glucose-Capillary: 278 mg/dL — ABNORMAL HIGH (ref 70–99)

## 2020-01-13 MED ORDER — SODIUM CHLORIDE 0.9 % IV BOLUS
1000.0000 mL | Freq: Once | INTRAVENOUS | Status: AC
  Administered 2020-01-13: 1000 mL via INTRAVENOUS

## 2020-01-13 NOTE — ED Notes (Signed)
Pt to triage with iv fluids infusing. Pt states he is ready to go home. Pt reports he has back problems and cant be sitting so long.

## 2020-01-15 ENCOUNTER — Ambulatory Visit
Admission: RE | Admit: 2020-01-15 | Discharge: 2020-01-15 | Disposition: A | Discharge status: Home / Self Care | Payer: Medicaid Other | Source: Ambulatory Visit | Attending: Gastroenterology | Admitting: Gastroenterology

## 2020-01-15 ENCOUNTER — Other Ambulatory Visit: Payer: Self-pay

## 2020-01-15 DIAGNOSIS — R1313 Dysphagia, pharyngeal phase: Secondary | ICD-10-CM

## 2020-01-15 DIAGNOSIS — R0989 Other specified symptoms and signs involving the circulatory and respiratory systems: Secondary | ICD-10-CM | POA: Diagnosis present

## 2020-01-15 DIAGNOSIS — R09A2 Foreign body sensation, throat: Secondary | ICD-10-CM

## 2020-01-15 DIAGNOSIS — R05 Cough: Secondary | ICD-10-CM | POA: Diagnosis present

## 2020-01-15 DIAGNOSIS — R053 Chronic cough: Secondary | ICD-10-CM

## 2020-01-22 ENCOUNTER — Ambulatory Visit: Payer: Medicaid Other | Attending: Anesthesiology | Admitting: Anesthesiology

## 2020-01-22 ENCOUNTER — Encounter: Payer: Self-pay | Admitting: Anesthesiology

## 2020-01-22 ENCOUNTER — Other Ambulatory Visit: Payer: Self-pay

## 2020-01-22 DIAGNOSIS — M47816 Spondylosis without myelopathy or radiculopathy, lumbar region: Secondary | ICD-10-CM

## 2020-01-22 DIAGNOSIS — F1190 Opioid use, unspecified, uncomplicated: Secondary | ICD-10-CM | POA: Diagnosis not present

## 2020-01-22 DIAGNOSIS — M25552 Pain in left hip: Secondary | ICD-10-CM

## 2020-01-22 DIAGNOSIS — M5431 Sciatica, right side: Secondary | ICD-10-CM

## 2020-01-22 DIAGNOSIS — M5432 Sciatica, left side: Secondary | ICD-10-CM

## 2020-01-22 DIAGNOSIS — F1910 Other psychoactive substance abuse, uncomplicated: Secondary | ICD-10-CM

## 2020-01-22 DIAGNOSIS — G894 Chronic pain syndrome: Secondary | ICD-10-CM | POA: Diagnosis not present

## 2020-01-22 DIAGNOSIS — M5136 Other intervertebral disc degeneration, lumbar region: Secondary | ICD-10-CM

## 2020-01-22 DIAGNOSIS — M961 Postlaminectomy syndrome, not elsewhere classified: Secondary | ICD-10-CM

## 2020-01-22 DIAGNOSIS — M25551 Pain in right hip: Secondary | ICD-10-CM

## 2020-01-22 MED ORDER — FENTANYL 75 MCG/HR TD PT72: 1.0000 | MEDICATED_PATCH | TRANSDERMAL | 0 refills | Status: DC

## 2020-01-22 NOTE — Addendum Note (Signed)
Addended by: Molli Barrows on: 01/22/2020 12:14 PM   Modules accepted: Orders

## 2020-01-22 NOTE — Progress Notes (Signed)
Virtual Visit via Telephone Note  I connected with Jeremy Long on 01/22/20 at  1:00 PM EDT by telephone and verified that I am speaking with the correct person using two identifiers.  Location: Patient: Home Provider: Pain control center   I discussed the limitations, risks, security and privacy concerns of performing an evaluation and management service by telephone and the availability of in person appointments. I also discussed with the patient that there may be a patient responsible charge related to this service. The patient expressed understanding and agreed to proceed.   History of Present Illness: I spoke with Jeremy Long via telephone as he was unable to do the video portion of the virtual conference.  He reports that he is continuing to have severe incapacitating low back pain despite the recent increase to the 75 mcg fentanyl patch.  He is taking it as prescribed and using this currently every other day.  He reports good relief with it but he is still reporting thoracic and lumbar pain is comparable to what he had at the 50 mcg level.  He is trying to walk his dog but states that he can barely get to the mailbox before he has incapacitating low back pain.  The pain does keep him up at night.  Otherwise he is doing some exercises as tolerated with physical therapy and no significant improvement otherwise noted.  In the past he has been seen by neurosurgery who found him to be a nonsurgical candidate.  He was also evaluated for possible dorsal column stimulator and refuses interventional therapy.  Furthermore with his medications he is denying any diverting or illicit use.  The quality characteristic and distribution of his low back pain and thoracic back pain are stable in nature.  No other changes are reported in his lower extremity strength or function or bowel or bladder function.  He did show positive on his UDS for cannabinoids.  He states that he was taking some CBD extract but since the  UDS result, he has discontinued this.    Observations/Objective:  Current Outpatient Medications:  .  allopurinol (ZYLOPRIM) 100 MG tablet, TAKE 1 TABLET BY MOUTH EVERY DAY, Disp: , Rfl:  .  [START ON 01/31/2020] fentaNYL (DURAGESIC) 75 MCG/HR, Place 1 patch onto the skin every other day., Disp: 15 patch, Rfl: 0 .  glipiZIDE (GLUCOTROL XL) 10 MG 24 hr tablet, Take by mouth., Disp: , Rfl:  .  Glycopyrrolate-Formoterol (BEVESPI AEROSPHERE) 9-4.8 MCG/ACT AERO, Inhale into the lungs., Disp: , Rfl:  .  lisinopril (ZESTRIL) 10 MG tablet, Take 10 mg by mouth daily., Disp: , Rfl:  .  LORazepam (ATIVAN) 1 MG tablet, Take 1 mg by mouth 3 (three) times daily as needed., Disp: , Rfl: 0 .  naproxen (NAPROSYN) 250 MG tablet, Take 1 tablet (250 mg total) by mouth 3 (three) times daily with meals. (Patient not taking: Reported on 07/09/2019), Disp: 30 tablet, Rfl: 0 .  predniSONE (DELTASONE) 50 MG tablet, Take one 50 mg tablet once a day for the next five days. (Patient not taking: Reported on 07/09/2019), Disp: 5 tablet, Rfl: 0 .  sildenafil (REVATIO) 20 MG tablet, TAKE 1 - 3 TABLETS AS DIRECTED., Disp: , Rfl: 3 .  varenicline (CHANTIX) 1 MG tablet, TAKE 1 TABLET (1 MG TOTAL) BY MOUTH 2 (TWO) TIMES DAILY FOR 30 DAYS, Disp: , Rfl:    Assessment and Plan:  1. Chronic pain syndrome   2. Chronic, continuous use of opioids   3. DDD (degenerative  disc disease), lumbar   4. Drug abuse ([place])   5. Facet arthritis of lumbar region   6. Post laminectomy syndrome   7. Bilateral sciatica   8. Bilateral hip pain   Based on our discussion today I am going to refill his medication for the Duragesic patch dated for July 17.  I had a long discussion with him regarding the cannabinoid issue and we will schedule him for return to clinic in 1 month for a urine screen at that time.  He understands the clinic policy regarding opioid administration and avoidance of cannabinoids.  Ultimately he may be a candidate to increase to  100 mcg patch as discussed with him today.  I want him to continue with his other medications and baseline pain management planning.  He is also to continue follow-up with his primary care physicians for his routine medical management. Follow Up Instructions:    I discussed the assessment and treatment plan with the patient. The patient was provided an opportunity to ask questions and all were answered. The patient agreed with the plan and demonstrated an understanding of the instructions.   The patient was advised to call back or seek an in-person evaluation if the symptoms worsen or if the condition fails to improve as anticipated.  I provided 30 minutes of non-face-to-face time during this encounter.   Molli Barrows, MD

## 2020-01-28 ENCOUNTER — Other Ambulatory Visit: Payer: Self-pay

## 2020-01-28 ENCOUNTER — Encounter: Payer: Self-pay | Admitting: Dietician

## 2020-01-28 ENCOUNTER — Encounter: Payer: Medicaid Other | Attending: Gastroenterology | Admitting: Dietician

## 2020-01-28 VITALS — Ht 66.0 in | Wt 273.3 lb

## 2020-01-28 DIAGNOSIS — Z713 Dietary counseling and surveillance: Secondary | ICD-10-CM | POA: Diagnosis not present

## 2020-01-28 DIAGNOSIS — E785 Hyperlipidemia, unspecified: Secondary | ICD-10-CM | POA: Diagnosis not present

## 2020-01-28 DIAGNOSIS — G4730 Sleep apnea, unspecified: Secondary | ICD-10-CM | POA: Diagnosis not present

## 2020-01-28 DIAGNOSIS — M109 Gout, unspecified: Secondary | ICD-10-CM | POA: Insufficient documentation

## 2020-01-28 DIAGNOSIS — I10 Essential (primary) hypertension: Secondary | ICD-10-CM | POA: Insufficient documentation

## 2020-01-28 DIAGNOSIS — G8929 Other chronic pain: Secondary | ICD-10-CM | POA: Insufficient documentation

## 2020-01-28 DIAGNOSIS — F17200 Nicotine dependence, unspecified, uncomplicated: Secondary | ICD-10-CM | POA: Insufficient documentation

## 2020-01-28 DIAGNOSIS — J449 Chronic obstructive pulmonary disease, unspecified: Secondary | ICD-10-CM | POA: Insufficient documentation

## 2020-01-28 DIAGNOSIS — E1169 Type 2 diabetes mellitus with other specified complication: Secondary | ICD-10-CM

## 2020-01-28 DIAGNOSIS — R748 Abnormal levels of other serum enzymes: Secondary | ICD-10-CM | POA: Insufficient documentation

## 2020-01-28 DIAGNOSIS — Z6841 Body Mass Index (BMI) 40.0 and over, adult: Secondary | ICD-10-CM | POA: Insufficient documentation

## 2020-01-28 DIAGNOSIS — E119 Type 2 diabetes mellitus without complications: Secondary | ICD-10-CM | POA: Insufficient documentation

## 2020-01-28 DIAGNOSIS — M549 Dorsalgia, unspecified: Secondary | ICD-10-CM | POA: Diagnosis not present

## 2020-01-28 NOTE — Progress Notes (Signed)
Medical Nutrition Therapy: Visit start time: 8502  end time: 1600  Assessment:  Diagnosis: elevated liver enzymes, obesity, type 2 diabetes Past medical history: COPD, HTN, hyperlipidemia, gout, sleep apnea, chronic back pain  Psychosocial issues/ stress concerns: pt rates stress as moderate and feels "ok" about stress management, pt declined counseling referral  Preferred learning method:   Auditory  Visual  Hands-on  Current weight: 273.3 lbs  Height: 5'6" Medications, supplements: reconciled in medical record  Labs A1c-8.8 (11/05/19) Smokes 1PPD, trying to quit   Progress and evaluation:   Pt reports he has cut out regular coke and many of the sweets (little debbies oatmeal creme pies, brownie bites) in his diet  Per NP note, pt reported eating a dozen donuts in one day  Pt reports he does check his blood sugar; pt states lowest is usually 200 and occasionally >500  Pt reports back pain makes preparing meals difficult and some days not possible  Rated pain as 5-8 out of 10 most days    Pt reports food textures need to be tender to accommodate sensitive tooth/teeth   Pt states wanting to move better and have less pain and 'remains hopeful that things will work out'  Physical activity: ADLs  Dietary Intake:  Usual eating pattern includes 2-3 meals and 2-3 snacks per day. Dining out frequency: 2 meals per week.  Breakfast: bacon and egg sandwich, egg and cheese mcmuffin, raisin bran cereal 2% milk  Snack: peach yoplait yogurt  Lunch: skip lunch, pimento cheese sandwich on white bread Snack: fig newtons, chocolate chip cookies, oatmeal cookies  (not everyday) Supper: sandwiches- ham and cheese with lettuce, tomato, onion and mayo or tuna salad; ground beef  Snack: 2 packs of belvita crackers, bowl of cereal, pimento cheese sandwich, ham and cheese sanwich    Beverages: water, milk, unsweet tea  Nutrition Care Education:  Basic nutrition: basic food groups, appropriate  nutrient balance, appropriate meal and snack schedule, general nutrition guidelines    Weight control: importance of low sugar and low fat choices, portion control  Diabetes:  appropriate meal and snack schedule, appropriate carb intake and balance, healthy carb choices Other lifestyle changes:  benefits of making changes, increasing motivation, readiness for change  Nutritional Diagnosis:  NB-1.1 Food and nutrition-related knowledge deficit As related to limited exposure to accurate nutrition-related information and disinterest in applying information.  As evidenced by diet recall and verbalizing incomplete information . [place]-3.3 Overweight/obesity As related to excess calorie intake and inadequate physical activity .  As evidenced by BMI of 44.1.  Intervention:  Pt diet seems high in processed meats, saturated fat, and sodium; low in fiber and F/V intake.    Increase F/V intake  Try frozen veggies that come in microwaveable pouches (may be tender than fresh and low prep time)  Try canned fruit NOT in heavy syrup (mandarin oranges, peaches, pears, applesauce) for snacks + protein (cheese, PB)  Smoothies may be easier to consume that chewing F/V  Decrease saturated fat intake   Try more plant-based sources of protein  Limit processed meats   Switch to low fat dairy products  Decrease sodium intake   Limit processed meats   Review how to read nutrition labels   At follow up, discuss seasoning foods without salt handout, reading a nutrition label, chair yoga, and link between food intake and inflammation  Education Materials given:   General diet guidelines for Diabetes  Carb counting for Diabetes  Plate Planner with food lists  Goals/  instructions  Learner/ who was taught:   Patient   Level of understanding:  Partial understanding; needs review/ practice  Demonstrated degree of understanding via:    Learning barriers:  None  Physical limitations- chronic back  pain   Willingness to learn/ readiness for change:  Acceptance, ready for change  Monitoring and Evaluation:  Dietary intake, exercise, BGs, liver enzymes, LDL, and body weight      follow up: August 11 at 2p

## 2020-02-02 ENCOUNTER — Telehealth: Payer: Self-pay | Admitting: Anesthesiology

## 2020-02-02 NOTE — Telephone Encounter (Signed)
Pharmacy called stating they did not have enough patches to fill his script or the right strength. They did work it out so he had enough for 20 days of patches equal to strength.  Patient has appt 8-2 for in person Med Mgmt.   Pharmacy was asking for new script to be sent in to cover the 10 days. But patient will be in for mm appt in 14 days. Please advise pharmacy what answer is.

## 2020-02-16 ENCOUNTER — Other Ambulatory Visit: Payer: Self-pay

## 2020-02-16 ENCOUNTER — Ambulatory Visit: Payer: Medicaid Other | Attending: Anesthesiology | Admitting: Anesthesiology

## 2020-02-16 ENCOUNTER — Encounter: Payer: Self-pay | Admitting: Anesthesiology

## 2020-02-16 VITALS — BP 139/93 | Temp 97.1°F | Resp 18 | Ht 66.0 in | Wt 285.0 lb

## 2020-02-16 DIAGNOSIS — M5431 Sciatica, right side: Secondary | ICD-10-CM | POA: Insufficient documentation

## 2020-02-16 DIAGNOSIS — M961 Postlaminectomy syndrome, not elsewhere classified: Secondary | ICD-10-CM | POA: Insufficient documentation

## 2020-02-16 DIAGNOSIS — G894 Chronic pain syndrome: Secondary | ICD-10-CM

## 2020-02-16 DIAGNOSIS — F1910 Other psychoactive substance abuse, uncomplicated: Secondary | ICD-10-CM | POA: Diagnosis present

## 2020-02-16 DIAGNOSIS — F1190 Opioid use, unspecified, uncomplicated: Secondary | ICD-10-CM | POA: Diagnosis not present

## 2020-02-16 DIAGNOSIS — M25551 Pain in right hip: Secondary | ICD-10-CM | POA: Insufficient documentation

## 2020-02-16 DIAGNOSIS — M5136 Other intervertebral disc degeneration, lumbar region: Secondary | ICD-10-CM | POA: Diagnosis not present

## 2020-02-16 DIAGNOSIS — M47816 Spondylosis without myelopathy or radiculopathy, lumbar region: Secondary | ICD-10-CM | POA: Diagnosis present

## 2020-02-16 DIAGNOSIS — M25552 Pain in left hip: Secondary | ICD-10-CM | POA: Insufficient documentation

## 2020-02-16 DIAGNOSIS — M5432 Sciatica, left side: Secondary | ICD-10-CM | POA: Insufficient documentation

## 2020-02-16 MED ORDER — FENTANYL 75 MCG/HR TD PT72: 1.0000 | MEDICATED_PATCH | TRANSDERMAL | 0 refills | Status: DC

## 2020-02-16 NOTE — Progress Notes (Signed)
Nursing Pain Medication Assessment:  Safety precautions to be maintained throughout the outpatient stay will include: orient to surroundings, keep bed in low position, maintain call bell within reach at all times, provide assistance with transfer out of bed and ambulation.  Medication Inspection Compliance: Pill count conducted under aseptic conditions, in front of the patient. Neither the pills nor the bottle was removed from the patient's sight at any time. Once count was completed pills were immediately returned to the patient in their original bottle.  Medication: Fentanyl patch 25 mcg Pill/Patch Count: 5 of 5 pills remain Pill/Patch Appearance: Markings consistent with prescribed medication Bottle Appearance: Standard pharmacy container. Clearly labeled. Filled Date:07 / 17 / 2021 Last Medication intake:  Today   Fentanyl patch 50 mcg 7/10 01/31/2020 Last placed on today

## 2020-02-17 NOTE — Progress Notes (Signed)
Subjective:  Patient ID: Jeremy Long, male    DOB: 10-24-1965  Age: 54 y.o. MRN: [id number]  CC: Back Pain (low)   Procedure: None  HPI Jeremy Long presents for reevaluation.  He continues to have severe pain with frequent breakthrough.  He is having difficulty with the 25 mcg patch placement secondary to its size but the 75 mcg combination seems to work reasonably well for him.  He does have frequent breakthrough he reports with this.  He has no side effects.  The quality characteristic and distribution of his pain remain unchanged.  Is mainly the low back pain and leg pain which is limiting his activity.  He reports that this limit forces him to be recumbent and frequently immobile and adding to his weight gain.  This further worsens his low back pain.  No change in strength to the lower extremities or bowel or bladder function are noted at this point.  Outpatient Medications Prior to Visit  Medication Sig Dispense Refill  . ACCU-CHEK GUIDE test strip 1 EACH (1 STRIP TOTAL) BY XX ROUTE 3 (THREE) TIMES DAILY USE AS INSTRUCTED.    . Accu-Chek Softclix Lancets lancets 1 each 3 (three) times daily.    Marland Kitchen albuterol (VENTOLIN HFA) 108 (90 Base) MCG/ACT inhaler SMARTSIG:2 Puff(s) By Mouth Every 6 Hours PRN    . allopurinol (ZYLOPRIM) 100 MG tablet TAKE 1 TABLET BY MOUTH EVERY DAY    . Blood Glucose Monitoring Suppl (ACCU-CHEK GUIDE) w/Device KIT See admin instructions.    Marland Kitchen glipiZIDE (GLUCOTROL XL) 10 MG 24 hr tablet Take by mouth.    . Glycopyrrolate-Formoterol (BEVESPI AEROSPHERE) 9-4.8 MCG/ACT AERO Inhale into the lungs.    Marland Kitchen ibuprofen (ADVIL) 600 MG tablet Take 600 mg by mouth every 6 (six) hours as needed.    Marland Kitchen lisinopril (ZESTRIL) 10 MG tablet Take 10 mg by mouth daily.    . varenicline (CHANTIX) 1 MG tablet TAKE 1 TABLET (1 MG TOTAL) BY MOUTH 2 (TWO) TIMES DAILY FOR 30 DAYS    . fentaNYL (DURAGESIC) 75 MCG/HR Place 1 patch onto the skin every other day. 15 patch 0  . FLUoxetine  (PROZAC) 20 MG capsule Take 20 mg by mouth every morning. (Patient not taking: Reported on 02/16/2020)    . GAVILAX 17 GM/SCOOP powder TAKE AS DIRECTED FOR COLONIC PREP. (Patient not taking: Reported on 02/16/2020)    . glipiZIDE (GLUCOTROL XL) 5 MG 24 hr tablet Take 5 mg by mouth daily. (Patient not taking: Reported on 02/16/2020)    . hydrOXYzine (ATARAX/VISTARIL) 25 MG tablet Take by mouth. (Patient not taking: Reported on 02/16/2020)    . LORazepam (ATIVAN) 1 MG tablet Take 1 mg by mouth 3 (three) times daily as needed. (Patient not taking: Reported on 02/16/2020)  0  . naproxen (NAPROSYN) 250 MG tablet Take 1 tablet (250 mg total) by mouth 3 (three) times daily with meals. (Patient not taking: Reported on 07/09/2019) 30 tablet 0  . oxyCODONE-acetaminophen (PERCOCET/ROXICET) 5-325 MG tablet Take 1 tablet by mouth every 4 (four) hours as needed. (Patient not taking: Reported on 02/16/2020)    . pioglitazone (ACTOS) 30 MG tablet Take by mouth. (Patient not taking: Reported on 02/16/2020)    . predniSONE (DELTASONE) 50 MG tablet Take one 50 mg tablet once a day for the next five days. (Patient not taking: Reported on 07/09/2019) 5 tablet 0  . sildenafil (REVATIO) 20 MG tablet TAKE 1 - 3 TABLETS AS DIRECTED. (Patient not taking: Reported on 02/16/2020)  3   No facility-administered medications prior to visit.    Review of Systems CNS: No confusion or sedation Cardiac: No angina or palpitations GI: No abdominal pain or constipation Constitutional: No nausea vomiting fevers or chills  Objective:  BP (!) 139/93   Temp (!) 97.1 F (36.2 C)   Resp 18   Ht '5\' 6"'  (1.676 m)   Wt 285 lb (129.3 kg)   SpO2 96%   BMI 46.00 kg/m    BP Readings from Last 3 Encounters:  02/16/20 (!) 139/93  01/12/20 (!) 150/85  12/24/19 130/85     Wt Readings from Last 3 Encounters:  02/16/20 285 lb (129.3 kg)  01/28/20 273 lb 4.8 oz (124 kg)  12/24/19 280 lb (127 kg)     Physical Exam Pt is alert and oriented PERRL  EOMI HEART IS RRR no murmur or rub LCTA no wheezing or rales MUSCULOSKELETAL reveals some paraspinous muscle tenderness but no overt trigger points  He has pain on extension at the low back and lateral rotation.  He ambulates with an antalgic gait.  His lower extremity strength and function appears at baseline as does his muscle tone and bulk  Labs  No results found for: HGBA1C Lab Results  Component Value Date   CREATININE 0.81 01/12/2020    -------------------------------------------------------------------------------------------------------------------- Lab Results  Component Value Date   WBC 8.5 01/12/2020   HGB 15.7 01/12/2020   HCT 46.4 01/12/2020   PLT 175 01/12/2020   GLUCOSE 481 (H) 01/12/2020   NA 130 (L) 01/12/2020   K 4.6 01/12/2020   CL 94 (L) 01/12/2020   CREATININE 0.81 01/12/2020   BUN 13 01/12/2020   CO2 26 01/12/2020    --------------------------------------------------------------------------------------------------------------------- DG ESOPHAGUS W SINGLE CM (SOL OR THIN BA)  Result Date: 01/15/2020 CLINICAL DATA:  Globus sensation. Pharyngeal dysphagia. Chronic cough. EXAM: ESOPHOGRAM / BARIUM SWALLOW / BARIUM TABLET STUDY TECHNIQUE: Combined double contrast and single contrast examination performed using effervescent crystals, thick barium liquid, and thin barium liquid. The patient was observed with fluoroscopy swallowing a 13 mm barium sulphate tablet. FLUOROSCOPY TIME:  Fluoroscopy Time:  1 minutes 6 seconds Radiation Exposure Index (if provided by the fluoroscopic device): 59.3 mGy Scratch COMPARISON:  CT chest 12/02/2017. FINDINGS: Cervical esophagus appears unremarkable. No evidence of aspiration. Limited air-contrast visualization of the lower esophagus as the patient could not continue drinking thick barium. No focal abnormalities noted on single column study of the lower esophagus. Peristalsis normal. No reflux noted. Standard barium tablet passes  normally. IMPRESSION: No significant abnormality identified. Cervical esophagus appears unremarkable. Electronically Signed   By: Marcello Moores  Register   On: 01/15/2020 10:17     Assessment & Plan:   Jeremy Long was seen today for back pain.  Diagnoses and all orders for this visit:  Chronic pain syndrome  Chronic, continuous use of opioids  DDD (degenerative disc disease), lumbar  Drug abuse (HCC)  Facet arthritis of lumbar region  Post laminectomy syndrome  Bilateral sciatica  Bilateral hip pain  Facet arthropathy, lumbar  Other orders -     fentaNYL (DURAGESIC) 75 MCG/HR; Place 1 patch onto the skin every other day. -     fentaNYL (DURAGESIC) 75 MCG/HR; Place 1 patch onto the skin every other day.        ----------------------------------------------------------------------------------------------------------------------  Problem List Items Addressed This Visit      Unprioritized   Bilateral hip pain   Chronic pain syndrome - Primary   Relevant Medications   FLUoxetine (PROZAC) 20  MG capsule   oxyCODONE-acetaminophen (PERCOCET/ROXICET) 5-325 MG tablet   fentaNYL (DURAGESIC) 75 MCG/HR (Start on 02/29/2020)   fentaNYL (DURAGESIC) 75 MCG/HR (Start on 03/30/2020)   Chronic, continuous use of opioids   DDD (degenerative disc disease), lumbar   Relevant Medications   oxyCODONE-acetaminophen (PERCOCET/ROXICET) 5-325 MG tablet   fentaNYL (DURAGESIC) 75 MCG/HR (Start on 02/29/2020)   fentaNYL (DURAGESIC) 75 MCG/HR (Start on 03/30/2020)   Drug abuse ([place])   Facet arthropathy, lumbar   Post laminectomy syndrome   Sciatica   Relevant Medications   FLUoxetine (PROZAC) 20 MG capsule   hydrOXYzine (ATARAX/VISTARIL) 25 MG tablet    Other Visit Diagnoses    Facet arthritis of lumbar region       Relevant Medications   oxyCODONE-acetaminophen (PERCOCET/ROXICET) 5-325 MG tablet   fentaNYL (DURAGESIC) 75 MCG/HR (Start on 02/29/2020)   fentaNYL (DURAGESIC) 75 MCG/HR (Start on  03/30/2020)        ----------------------------------------------------------------------------------------------------------------------  1. Chronic pain syndrome His condition remains challenging to treat secondary to the chronicity of his pain and severity.  He has responded favorably to opioid management and I think a change to a maximum dose 100 mcg patch is appropriate however for right now we will keep him at the 70 mcg strength..  We have gone over the risks and benefits of chronic opioid management and our clinic policy.  His urine drug screen did come back for Jeremy Long and this has been discussed.  He reports having taken gummy CBD oil in the past.  He reports to have discontinued this and denies any marijuana usage.  2. Chronic, continuous use of opioids I have reviewed the [facility] practitioner database information and it is appropriate.  We will proceed with repeat refill on his Duragesic patch for August 16 and September 15.  This will be for the 100 mcg strength.  We talked about the risks and benefits of chronic opioid management at length.  3. DDD (degenerative disc disease), lumbar Continue core stretching strengthening exercises.  4. Drug abuse ([place]) Continue avoidance of illicit drug use.  He realizes he will be subject to random urine drug screens and we will continue to follow these periodically.  5. Facet arthritis of lumbar region As above  6. Post laminectomy syndrome As above  7. Bilateral sciatica He does not desire to proceed with any interventional therapy.  We have talked about options including epidural steroid and more interventional therapies including dorsal column stimulator and intrathecal pump but he refuses these.  8. Bilateral hip pain As above  9. Facet arthropathy, lumbar As above    ----------------------------------------------------------------------------------------------------------------------  I am having [name] start  on fentaNYL. I am also having him maintain his LORazepam, sildenafil, lisinopril, naproxen, predniSONE, allopurinol, glipiZIDE, Bevespi Aerosphere, varenicline, albuterol, Accu-Chek Guide, Accu-Chek Softclix Lancets, Accu-Chek Guide, ibuprofen, FLUoxetine, hydrOXYzine, oxyCODONE-acetaminophen, pioglitazone, GaviLAX, glipiZIDE, and fentaNYL.   Meds ordered this encounter  Medications  . fentaNYL (DURAGESIC) 75 MCG/HR    Sig: Place 1 patch onto the skin every other day.    Dispense:  15 patch    Refill:  0  . fentaNYL (DURAGESIC) 75 MCG/HR    Sig: Place 1 patch onto the skin every other day.    Dispense:  15 patch    Refill:  0   Patient's Medications  New Prescriptions   FENTANYL (DURAGESIC) 75 MCG/HR    Place 1 patch onto the skin every other day.  Previous Medications   ACCU-CHEK GUIDE TEST STRIP  1 EACH (1 STRIP TOTAL) BY XX ROUTE 3 (THREE) TIMES DAILY USE AS INSTRUCTED.   ACCU-CHEK SOFTCLIX LANCETS LANCETS    1 each 3 (three) times daily.   ALBUTEROL (VENTOLIN HFA) 108 (90 BASE) MCG/ACT INHALER    SMARTSIG:2 Puff(s) By Mouth Every 6 Hours PRN   ALLOPURINOL (ZYLOPRIM) 100 MG TABLET    TAKE 1 TABLET BY MOUTH EVERY DAY   BLOOD GLUCOSE MONITORING SUPPL (ACCU-CHEK GUIDE) W/DEVICE KIT    See admin instructions.   FLUOXETINE (PROZAC) 20 MG CAPSULE    Take 20 mg by mouth every morning.   GAVILAX 17 GM/SCOOP POWDER    TAKE AS DIRECTED FOR COLONIC PREP.   GLIPIZIDE (GLUCOTROL XL) 10 MG 24 HR TABLET    Take by mouth.   GLIPIZIDE (GLUCOTROL XL) 5 MG 24 HR TABLET    Take 5 mg by mouth daily.   GLYCOPYRROLATE-FORMOTEROL (BEVESPI AEROSPHERE) 9-4.8 MCG/ACT AERO    Inhale into the lungs.   HYDROXYZINE (ATARAX/VISTARIL) 25 MG TABLET    Take by mouth.   IBUPROFEN (ADVIL) 600 MG TABLET    Take 600 mg by mouth every 6 (six) hours as needed.   LISINOPRIL (ZESTRIL) 10 MG TABLET    Take 10 mg by mouth daily.   LORAZEPAM (ATIVAN) 1 MG TABLET    Take 1 mg by mouth 3 (three) times daily as needed.    NAPROXEN (NAPROSYN) 250 MG TABLET    Take 1 tablet (250 mg total) by mouth 3 (three) times daily with meals.   OXYCODONE-ACETAMINOPHEN (PERCOCET/ROXICET) 5-325 MG TABLET    Take 1 tablet by mouth every 4 (four) hours as needed.   PIOGLITAZONE (ACTOS) 30 MG TABLET    Take by mouth.   PREDNISONE (DELTASONE) 50 MG TABLET    Take one 50 mg tablet once a day for the next five days.   SILDENAFIL (REVATIO) 20 MG TABLET    TAKE 1 - 3 TABLETS AS DIRECTED.   VARENICLINE (CHANTIX) 1 MG TABLET    TAKE 1 TABLET (1 MG TOTAL) BY MOUTH 2 (TWO) TIMES DAILY FOR 30 DAYS  Modified Medications   Modified Medication Previous Medication   FENTANYL (DURAGESIC) 75 MCG/HR fentaNYL (DURAGESIC) 75 MCG/HR      Place 1 patch onto the skin every other day.    Place 1 patch onto the skin every other day.  Discontinued Medications   No medications on file   ----------------------------------------------------------------------------------------------------------------------  Follow-up: Return in about 2 months (around 04/17/2020) for evaluation, med refill.    Molli Barrows, MD

## 2020-02-18 LAB — TOXASSURE SELECT 13 (MW), URINE

## 2020-02-25 ENCOUNTER — Ambulatory Visit: Payer: Medicaid Other | Admitting: Dietician

## 2020-03-03 ENCOUNTER — Other Ambulatory Visit: Payer: Self-pay

## 2020-03-03 ENCOUNTER — Encounter: Payer: Medicaid Other | Attending: Gastroenterology | Admitting: Dietician

## 2020-03-03 DIAGNOSIS — R748 Abnormal levels of other serum enzymes: Secondary | ICD-10-CM | POA: Insufficient documentation

## 2020-03-03 DIAGNOSIS — E785 Hyperlipidemia, unspecified: Secondary | ICD-10-CM | POA: Diagnosis not present

## 2020-03-03 DIAGNOSIS — E119 Type 2 diabetes mellitus without complications: Secondary | ICD-10-CM | POA: Insufficient documentation

## 2020-03-03 DIAGNOSIS — Z713 Dietary counseling and surveillance: Secondary | ICD-10-CM | POA: Insufficient documentation

## 2020-03-03 DIAGNOSIS — M109 Gout, unspecified: Secondary | ICD-10-CM | POA: Diagnosis not present

## 2020-03-03 DIAGNOSIS — Z6841 Body Mass Index (BMI) 40.0 and over, adult: Secondary | ICD-10-CM

## 2020-03-03 DIAGNOSIS — F17200 Nicotine dependence, unspecified, uncomplicated: Secondary | ICD-10-CM | POA: Diagnosis not present

## 2020-03-03 DIAGNOSIS — G4730 Sleep apnea, unspecified: Secondary | ICD-10-CM | POA: Diagnosis not present

## 2020-03-03 DIAGNOSIS — I10 Essential (primary) hypertension: Secondary | ICD-10-CM | POA: Diagnosis not present

## 2020-03-03 DIAGNOSIS — J449 Chronic obstructive pulmonary disease, unspecified: Secondary | ICD-10-CM | POA: Insufficient documentation

## 2020-03-03 DIAGNOSIS — M549 Dorsalgia, unspecified: Secondary | ICD-10-CM | POA: Insufficient documentation

## 2020-03-03 DIAGNOSIS — G8929 Other chronic pain: Secondary | ICD-10-CM | POA: Insufficient documentation

## 2020-03-03 DIAGNOSIS — E1169 Type 2 diabetes mellitus with other specified complication: Secondary | ICD-10-CM

## 2020-03-03 NOTE — Patient Instructions (Addendum)
   Continue to decrease how many sugar-sweetened beverages you drink   Continue to try to increase at vegetables with lunch and dinner   Practice mindful eating to make sure you are eating when you are hungry instead of bored

## 2020-03-03 NOTE — Progress Notes (Signed)
Medical Nutrition Therapy: Visit start time: 1400  end time: 1500  Assessment:  Diagnosis: high liver enzymes, obesity, type 2 diabetes Medical history changes: none Psychosocial issues/ stress concerns: no changes   Current weight: 274.4 lbs  Height: 5'6" Medications, supplement changes: reconciled in medical record  Smokes 1PPD, trying to quit  Progress and evaluation:  . Weight consistent since last visit on 01/28/2020, 273.3 lbs . Pt reports using air fryer for meats more frequently instead of frying  . Pt states he has continued with the no regular soda and limited diluted sweet tea  . Pt reports change in medication from glipizide, pt estimates fasting BGs were 300, 350; to metformin for about a week, pt reports improvement in fasting BG to around 105  . Pt states he wants to try eating a collards-only diet for one week o I advised against it, but pt seems committed to trying this  . Pt requested an appetite suppressant to 'jump start his weight loss' o We discussed the pros and cons of appetite suppressants, and I let him know that I would document this request in the visit notes today  . Pt reports no changes in eating habits since last visit  . Pt reports pain still 5-8 out of 10 most days  . Pt reports tooth repair is in process, food textures still need to remain tender for now    Physical activity: ADLs   Nutrition Care Education:   Weight control: reviewed progress since previous visit, determining reasonable weight loss rate, portion control strategies, tracking food intake Advanced nutrition:  recipe modification, cooking techniques, dining out, food label reading Diabetes:  goals for BGs, appropriate meal and snack schedule, appropriate carb intake and balance, role of fiber, protein, fat  Hypertension:  identifying high sodium foods, identifying food sources of potassium, magnesium Other lifestyle changes:  Mindful eating practices   Nutritional Diagnosis:  NB-1.6  Limited adherence to nutrition-related recommendations As related to obesity, type 2 diabetes, and history of HTN.  As evidenced by pt report of no changes to diet, limited motivation to make changes.  Intervention:  Pt still needs review of sodium content and sugar content of certain foods.  Pt reports still frequently eating red meat, processed meats, pimento cheese, and fast food, despite stating that he does not eat a lot of salt and 'most foods taste salty to him'.  Pt reports he does not eat or drink much sugar, despite stating he sweetens his tea with sugar, drinks at least 1 can of soda a day, and still eats desserts regularly.    Pt diet remains high in processed meats, saturated fat, and sodium; low in fiber and F/V intake.    Increase F/V intake  Try frozen veggies that come in microwaveable pouches (may be tender than fresh and low prep time)  Try canned fruit NOT in heavy syrup (mandarin oranges, peaches, pears, applesauce) for snacks + protein (cheese, PB)  Smoothies may be easier to consume that chewing F/V  Decrease saturated fat intake   Try more plant-based sources of protein  Limit processed meats   Switch to low fat dairy products  Decrease sodium intake   Limit processed meats   Review how to read nutrition labels   Education Materials given:  Marland Kitchen Mindful eating- is it time to eat handout  . Plate planner with food lists  . DASH diet handout  . Goals/ instructions  Learner/ who was taught:  . Patient   Level of  understanding: . Partial understanding; needs review/ practice  Demonstrated degree of understanding via:   Teach back Learning barriers: . None . Physical limitations- chronic back pain   Willingness to learn/ readiness for change: . Acceptance, ready for change   Monitoring and Evaluation:  Dietary intake, exercise, liver enzymes, LDL, BGs, A1c, and body weight      follow up: September 29th at [facility]

## 2020-03-24 ENCOUNTER — Other Ambulatory Visit: Payer: Self-pay | Admitting: Internal Medicine

## 2020-03-24 DIAGNOSIS — R410 Disorientation, unspecified: Secondary | ICD-10-CM

## 2020-04-01 ENCOUNTER — Emergency Department: Payer: Medicaid Other

## 2020-04-01 ENCOUNTER — Encounter: Payer: Self-pay | Admitting: Emergency Medicine

## 2020-04-01 DIAGNOSIS — M545 Low back pain: Secondary | ICD-10-CM | POA: Diagnosis not present

## 2020-04-01 DIAGNOSIS — Z5321 Procedure and treatment not carried out due to patient leaving prior to being seen by health care provider: Secondary | ICD-10-CM | POA: Diagnosis not present

## 2020-04-01 DIAGNOSIS — R300 Dysuria: Secondary | ICD-10-CM | POA: Insufficient documentation

## 2020-04-01 LAB — URINALYSIS, COMPLETE (UACMP) WITH MICROSCOPIC
Bilirubin Urine: NEGATIVE
Glucose, UA: NEGATIVE mg/dL
Ketones, ur: NEGATIVE mg/dL
Nitrite: NEGATIVE
Protein, ur: NEGATIVE mg/dL
Specific Gravity, Urine: 1.032 — ABNORMAL HIGH (ref 1.005–1.030)
Squamous Epithelial / HPF: NONE SEEN (ref 0–5)
WBC, UA: >50 WBC/hpf — ABNORMAL HIGH (ref 0–5)
pH: 5 (ref 5.0–8.0)

## 2020-04-01 LAB — URINE DRUG SCREEN, QUALITATIVE (ARMC ONLY)
Amphetamines, Ur Screen: NOT DETECTED
Barbiturates, Ur Screen: NOT DETECTED
Benzodiazepine, Ur Scrn: NOT DETECTED
Cannabinoid 50 Ng, Ur ~~LOC~~: NOT DETECTED
Cocaine Metabolite,Ur ~~LOC~~: NOT DETECTED
MDMA (Ecstasy)Ur Screen: NOT DETECTED
Methadone Scn, Ur: NOT DETECTED
Opiate, Ur Screen: NOT DETECTED
Phencyclidine (PCP) Ur S: NOT DETECTED
Tricyclic, Ur Screen: NOT DETECTED

## 2020-04-01 NOTE — ED Triage Notes (Signed)
Pt c/o lower back pain and dysuria x3 days. Pt seen at PCP and had provided a UA sample. Per pt, urine was clear. Pt has hx/o chronic back pain.

## 2020-04-02 ENCOUNTER — Emergency Department
Admission: EM | Admit: 2020-04-02 | Discharge: 2020-04-02 | Disposition: A | Discharge status: Home / Self Care | Payer: Medicaid Other | Attending: Emergency Medicine | Admitting: Emergency Medicine

## 2020-04-02 ENCOUNTER — Telehealth: Payer: Self-pay | Admitting: Anesthesiology

## 2020-04-02 NOTE — Telephone Encounter (Signed)
Woke up about 4 days ago in excruciating pain. Still having it. Has spoke with pcp and went to ED. They did CT. No kidney stones. Wants to know if Dr. Andree Elk can help with this pain

## 2020-04-02 NOTE — Telephone Encounter (Signed)
He needs an appointment with Dr Andree Elk to discuss.  Please schedule a virtual appointment.

## 2020-04-02 NOTE — Telephone Encounter (Signed)
Monday at 2pm

## 2020-04-02 NOTE — ED Notes (Signed)
Pt called for VS with no answer. Pt called by phone and pt reports he left ED.

## 2020-04-03 LAB — URINE CULTURE: Culture: NO GROWTH

## 2020-04-05 ENCOUNTER — Other Ambulatory Visit: Payer: Self-pay

## 2020-04-05 ENCOUNTER — Ambulatory Visit: Payer: Medicaid Other | Attending: Anesthesiology | Admitting: Anesthesiology

## 2020-04-05 ENCOUNTER — Encounter: Payer: Self-pay | Admitting: Anesthesiology

## 2020-04-05 ENCOUNTER — Telehealth: Payer: Self-pay | Admitting: Anesthesiology

## 2020-04-05 DIAGNOSIS — F1190 Opioid use, unspecified, uncomplicated: Secondary | ICD-10-CM

## 2020-04-05 DIAGNOSIS — M5432 Sciatica, left side: Secondary | ICD-10-CM

## 2020-04-05 DIAGNOSIS — M961 Postlaminectomy syndrome, not elsewhere classified: Secondary | ICD-10-CM

## 2020-04-05 DIAGNOSIS — F1910 Other psychoactive substance abuse, uncomplicated: Secondary | ICD-10-CM

## 2020-04-05 DIAGNOSIS — G894 Chronic pain syndrome: Secondary | ICD-10-CM | POA: Diagnosis not present

## 2020-04-05 DIAGNOSIS — M5136 Other intervertebral disc degeneration, lumbar region: Secondary | ICD-10-CM

## 2020-04-05 DIAGNOSIS — M47816 Spondylosis without myelopathy or radiculopathy, lumbar region: Secondary | ICD-10-CM

## 2020-04-05 DIAGNOSIS — M5431 Sciatica, right side: Secondary | ICD-10-CM

## 2020-04-05 DIAGNOSIS — M25552 Pain in left hip: Secondary | ICD-10-CM

## 2020-04-05 DIAGNOSIS — M25551 Pain in right hip: Secondary | ICD-10-CM

## 2020-04-05 MED ORDER — FENTANYL 75 MCG/HR TD PT72
1.0000 | MEDICATED_PATCH | TRANSDERMAL | 0 refills | Status: DC
Start: 2020-05-29 — End: 2020-05-26

## 2020-04-05 MED ORDER — FENTANYL 75 MCG/HR TD PT72
1.0000 | MEDICATED_PATCH | TRANSDERMAL | 0 refills | Status: DC
Start: 2020-04-29 — End: 2020-05-26

## 2020-04-05 MED ORDER — CELECOXIB 200 MG PO CAPS: 200.0000 mg | ORAL_CAPSULE | Freq: Every day | ORAL | 1 refills | Status: AC

## 2020-04-05 MED ORDER — OXYCODONE-ACETAMINOPHEN 7.5-325 MG PO TABS: 1.0000 | ORAL_TABLET | Freq: Two times a day (BID) | ORAL | 0 refills | Status: DC | PRN

## 2020-04-05 NOTE — Telephone Encounter (Signed)
pharmcy told  Patient he needs prior auth for his percocet.

## 2020-04-05 NOTE — Progress Notes (Signed)
Virtual Visit via Telephone Note  I connected with Jeremy Long on 04/05/20 at  2:00 PM EDT by telephone and verified that I am speaking with the correct person using two identifiers.  Location: Patient: Home Provider: Pain control center   I discussed the limitations, risks, security and privacy concerns of performing an evaluation and management service by telephone and the availability of in person appointments. I also discussed with the patient that there may be a patient responsible charge related to this service. The patient expressed understanding and agreed to proceed.   History of Present Illness: I spoke with Jeremy Long via telephone as he was unable to do the video portion of the virtual conference and he reports that his back pain has been stable.  He still taking his Duragesic patch and applying these every other day and these continue to give him good relief but he does breakthrough with pain.  The quality characteristic distribution of his low back pain and leg pain have remained stable.  No changes are noted.  His bowel and bladder function and lower extremity strength and function are also at baseline.  He currently is being treated for a kidney stone and has had considerable paroxysmal type pain over the last few days for this.  He was seen in the ER on Thursday he reports.  He is being treated with antibiotics and has not been able to have any other type of short acting opioid prescribed for this additional coverage.  Otherwise he is in his usual state of health.   Observations/Objective:  Current Outpatient Medications:  .  ACCU-CHEK GUIDE test strip, 1 EACH (1 STRIP TOTAL) BY XX ROUTE 3 (THREE) TIMES DAILY USE AS INSTRUCTED., Disp: , Rfl:  .  Accu-Chek Softclix Lancets lancets, 1 each 3 (three) times daily., Disp: , Rfl:  .  albuterol (VENTOLIN HFA) 108 (90 Base) MCG/ACT inhaler, SMARTSIG:2 Puff(s) By Mouth Every 6 Hours PRN, Disp: , Rfl:  .  allopurinol (ZYLOPRIM) 100 MG  tablet, TAKE 1 TABLET BY MOUTH EVERY DAY, Disp: , Rfl:  .  Blood Glucose Monitoring Suppl (ACCU-CHEK GUIDE) w/Device KIT, See admin instructions., Disp: , Rfl:  .  celecoxib (CELEBREX) 200 MG capsule, Take 1 capsule (200 mg total) by mouth daily., Disp: 30 capsule, Rfl: 1 .  [START ON 04/29/2020] fentaNYL (DURAGESIC) 75 MCG/HR, Place 1 patch onto the skin every other day., Disp: 15 patch, Rfl: 0 .  [START ON 05/29/2020] fentaNYL (DURAGESIC) 75 MCG/HR, Place 1 patch onto the skin every other day., Disp: 15 patch, Rfl: 0 .  FLUoxetine (PROZAC) 20 MG capsule, Take 20 mg by mouth every morning. (Patient not taking: Reported on 02/16/2020), Disp: , Rfl:  .  GAVILAX 17 GM/SCOOP powder, TAKE AS DIRECTED FOR COLONIC PREP. (Patient not taking: Reported on 02/16/2020), Disp: , Rfl:  .  glipiZIDE (GLUCOTROL XL) 10 MG 24 hr tablet, Take by mouth., Disp: , Rfl:  .  glipiZIDE (GLUCOTROL XL) 5 MG 24 hr tablet, Take 5 mg by mouth daily. (Patient not taking: Reported on 02/16/2020), Disp: , Rfl:  .  Glycopyrrolate-Formoterol (BEVESPI AEROSPHERE) 9-4.8 MCG/ACT AERO, Inhale into the lungs., Disp: , Rfl:  .  hydrOXYzine (ATARAX/VISTARIL) 25 MG tablet, Take by mouth. (Patient not taking: Reported on 02/16/2020), Disp: , Rfl:  .  ibuprofen (ADVIL) 600 MG tablet, Take 600 mg by mouth every 6 (six) hours as needed., Disp: , Rfl:  .  lisinopril (ZESTRIL) 10 MG tablet, Take 10 mg by mouth daily., Disp: ,  Rfl:  .  LORazepam (ATIVAN) 1 MG tablet, Take 1 mg by mouth 3 (three) times daily as needed. (Patient not taking: Reported on 02/16/2020), Disp: , Rfl: 0 .  naproxen (NAPROSYN) 250 MG tablet, Take 1 tablet (250 mg total) by mouth 3 (three) times daily with meals. (Patient not taking: Reported on 07/09/2019), Disp: 30 tablet, Rfl: 0 .  oxyCODONE-acetaminophen (PERCOCET) 7.5-325 MG tablet, Take 1 tablet by mouth 2 (two) times daily as needed for moderate pain or severe pain., Disp: 10 tablet, Rfl: 0 .  oxyCODONE-acetaminophen  (PERCOCET/ROXICET) 5-325 MG tablet, Take 1 tablet by mouth every 4 (four) hours as needed. (Patient not taking: Reported on 02/16/2020), Disp: , Rfl:  .  pioglitazone (ACTOS) 30 MG tablet, Take by mouth. (Patient not taking: Reported on 02/16/2020), Disp: , Rfl:  .  predniSONE (DELTASONE) 50 MG tablet, Take one 50 mg tablet once a day for the next five days. (Patient not taking: Reported on 07/09/2019), Disp: 5 tablet, Rfl: 0 .  sildenafil (REVATIO) 20 MG tablet, TAKE 1 - 3 TABLETS AS DIRECTED. (Patient not taking: Reported on 02/16/2020), Disp: , Rfl: 3 .  varenicline (CHANTIX) 1 MG tablet, TAKE 1 TABLET (1 MG TOTAL) BY MOUTH 2 (TWO) TIMES DAILY FOR 30 DAYS, Disp: , Rfl:   Assessment and Plan: 1. Chronic pain syndrome   2. Chronic, continuous use of opioids   3. DDD (degenerative disc disease), lumbar   4. Drug abuse ([place])   5. Facet arthritis of lumbar region   6. Post laminectomy syndrome   7. Bilateral sciatica   8. Bilateral hip pain   9. Facet arthropathy, lumbar   Based on our discussion today and upon review of the [facility] practitioner database information when to go ahead and refill his Duragesic medications for the next 2 months dated for October 14 and November 13.  He has been compliant with her regimen and show no evidence of any diverting or illicit use and continues to derive good functional benefit from the medications.  Furthermore and secondary to the kidney stone I am going to prescribe a short duration Percocet 7.5 mg tablet to be taken twice daily as needed for breakthrough pain as he is describing substantial and severe and incapacitating proximal-isms of pain with the kidney stone.  Lastly I want to start him on Celebrex 200 mg tablet as he was unable to take Naprosyn which causes gastric distress and see if this can help with the combination therapy.  He is to continue core stretching strengthening exercises when possible with return to clinic scheduled in 2 months.  And he  is to continue with his primary care physicians.  Follow Up Instructions:    I discussed the assessment and treatment plan with the patient. The patient was provided an opportunity to ask questions and all were answered. The patient agreed with the plan and demonstrated an understanding of the instructions.   The patient was advised to call back or seek an in-person evaluation if the symptoms worsen or if the condition fails to improve as anticipated.  I provided 30 minutes of non-face-to-face time during this encounter.   Molli Barrows, MD

## 2020-04-06 ENCOUNTER — Telehealth: Payer: Self-pay | Admitting: *Deleted

## 2020-04-06 NOTE — Telephone Encounter (Signed)
Called pharmacy to ask what the issue is with filling the Oxy/Acet. A high-dose PA is needed, but patient said he is willing to pay cash. Also they do not have the 7.5mg  in stock, but they do have the 5mg . I notified patient, he is willing to take the 7.5mg . I contacted Dr. Andree Elk, he will send another script for 5 mg. Current script for 7.5mg  cancelled. Patient notified.

## 2020-04-06 NOTE — Telephone Encounter (Signed)
Patient called stating he spoke with a nurse and was told his PA was approved. He called pharmacy and they still say it hasnt come thru yet. I told him to give it a little bit of time and check again. He will call back around 3 if it hasnt gone thru to pharmacy.

## 2020-04-06 NOTE — Telephone Encounter (Signed)
Patient notified Oxycodone/Acetaminophen has been approved by insurance.

## 2020-04-07 MED ORDER — OXYCODONE-ACETAMINOPHEN 5-325 MG PO TABS
1.0000 | ORAL_TABLET | Freq: Three times a day (TID) | ORAL | 0 refills | Status: AC | PRN
Start: 2020-04-07 — End: 2020-04-12

## 2020-04-07 NOTE — Addendum Note (Signed)
Addended by: Molli Barrows on: 04/07/2020 10:24 AM   Modules accepted: Orders

## 2020-04-13 ENCOUNTER — Ambulatory Visit
Admission: RE | Admit: 2020-04-13 | Discharge: 2020-04-13 | Disposition: A | Discharge status: Home / Self Care | Payer: Medicaid Other | Source: Ambulatory Visit | Attending: Internal Medicine | Admitting: Internal Medicine

## 2020-04-13 ENCOUNTER — Other Ambulatory Visit: Payer: Self-pay

## 2020-04-13 DIAGNOSIS — R410 Disorientation, unspecified: Secondary | ICD-10-CM

## 2020-04-14 ENCOUNTER — Ambulatory Visit: Payer: Medicaid Other | Admitting: Dietician

## 2020-04-26 ENCOUNTER — Encounter: Payer: Medicaid Other | Admitting: Anesthesiology

## 2020-05-14 IMAGING — MR MR THORACIC SPINE W/O CM
6 series · 36 of 48 positions shown · non-contrast
Comparison: None.

CLINICAL DATA: Chronic back pain.

EXAM:
MRI THORACIC SPINE WITHOUT CONTRAST
TECHNIQUE: Multiplanar, multisequence MR imaging of the thoracic spine was
performed. No intravenous contrast was administered.

[Series 16: T1 · sagittal · 5.0mm · 1.88mm/px · 4 of 9 slices shown (1 of 2)]
[im 1/9]
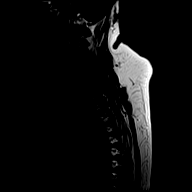
[im 3/9]
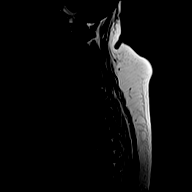
[im 6/9]
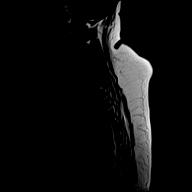
[im 9/9]
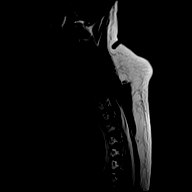

[Series 17: T2 · sagittal · 3.0mm · 1.33mm/px · 6 of 19 slices shown (1 of 2)]
[im 1/19]
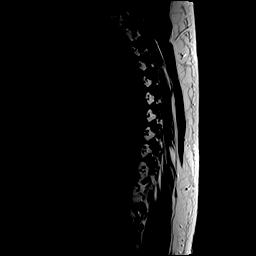
[im 4/19]
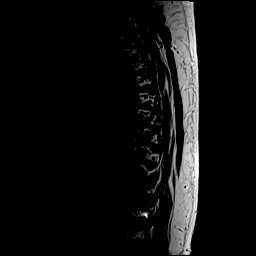
[im 8/19]
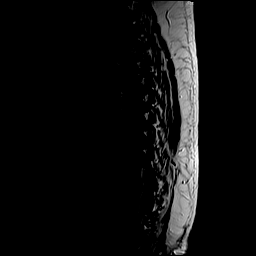
[im 11/19]
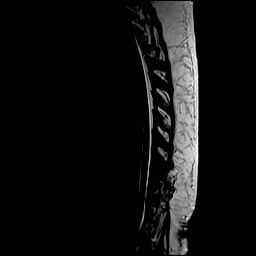
[im 15/19]
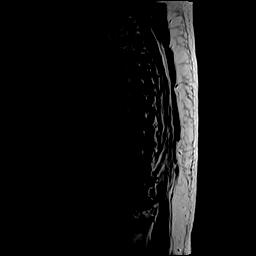
[im 19/19]
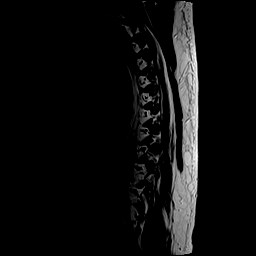

[Series 18: T1 · sagittal · 3.0mm · 1.33mm/px · 6 of 19 slices shown (2 of 2)]
[im 1/19]
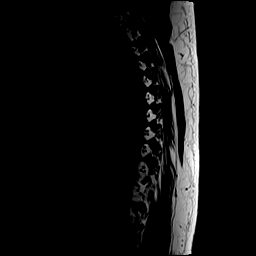
[im 4/19]
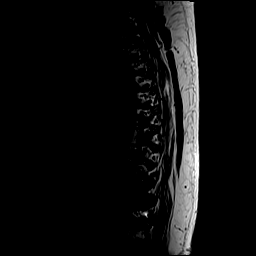
[im 8/19]
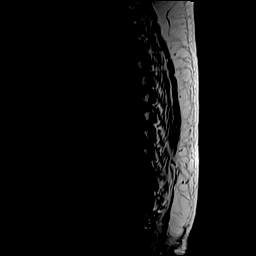
[im 11/19]
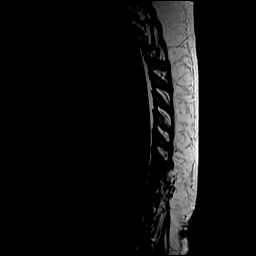
[im 15/19]
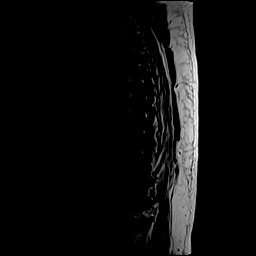
[im 19/19]
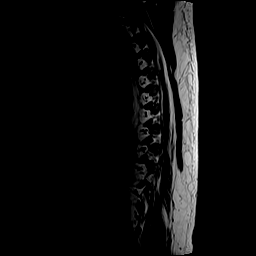

[Series 19: STIR · sagittal · 3.0mm · 0.66mm/px · 6 of 19 slices shown]
[im 1/19]
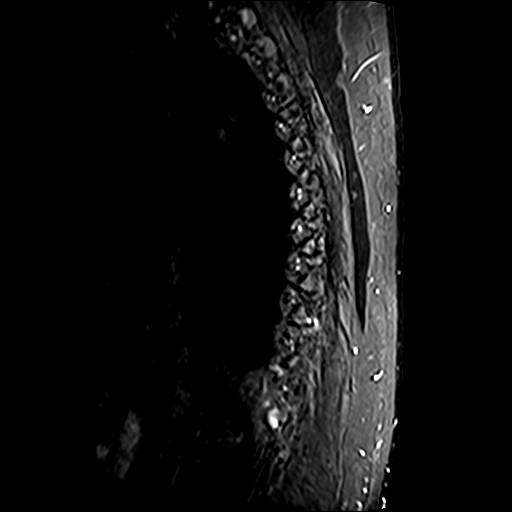
[im 4/19]
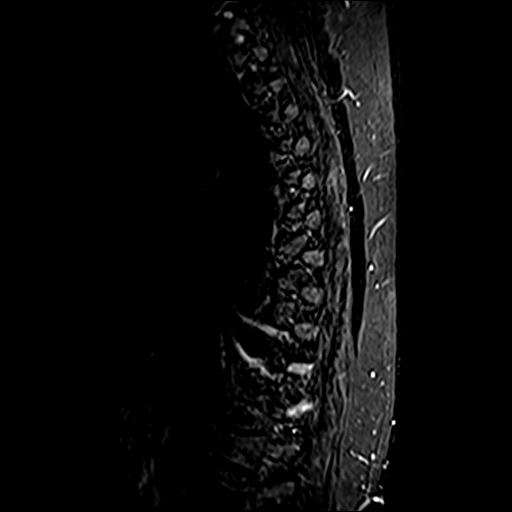
[im 8/19]
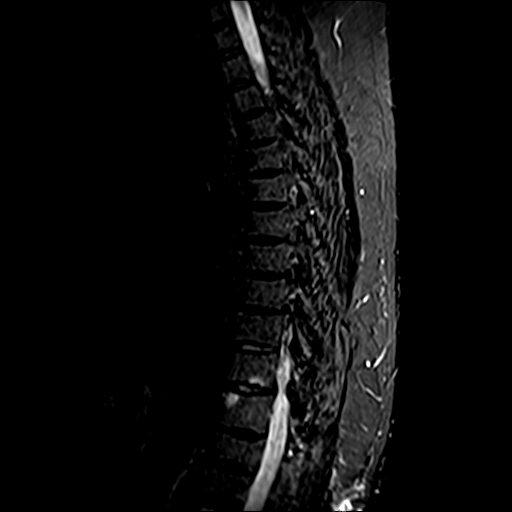
[im 11/19]
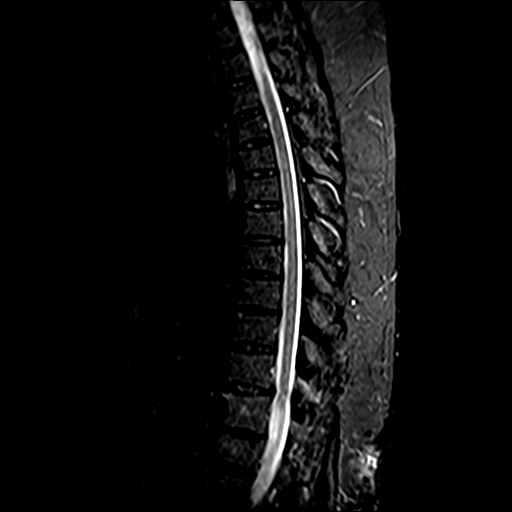
[im 15/19]
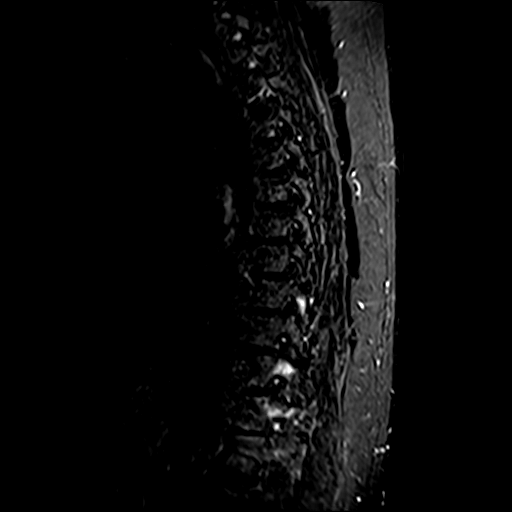
[im 19/19]
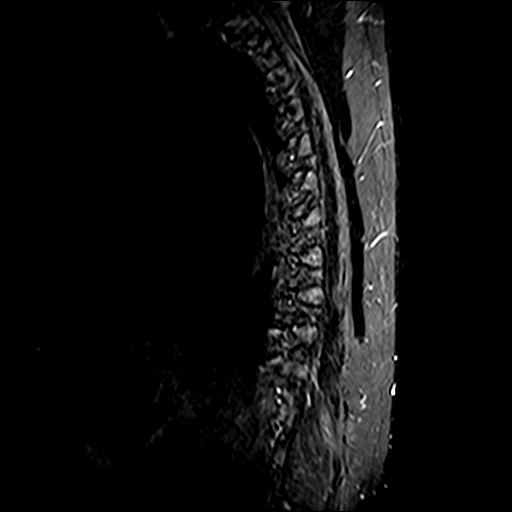

[Series 20: T2 · axial · 4.0mm · 0.74mm/px · z∈[-385,-157]mm · 9 of 39 slices shown (2 of 2)]
[im 1/39]
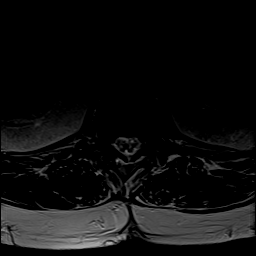
[im 7/39]
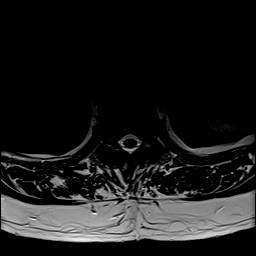
[im 13/39]
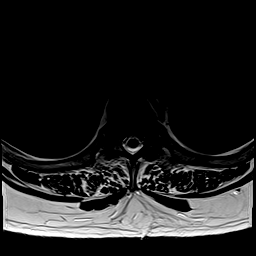
[im 16/39]
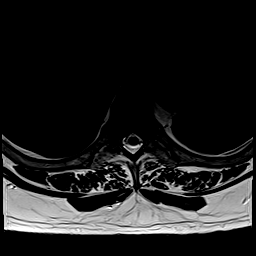
[im 20/39]
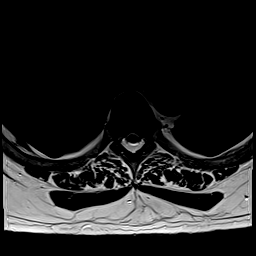
[im 23/39]
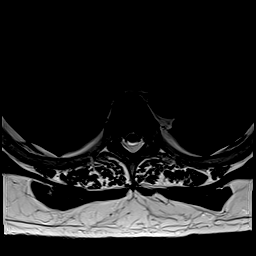
[im 26/39]
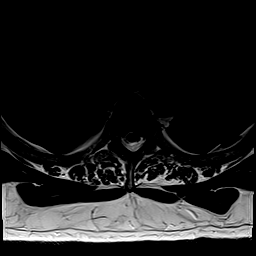
[im 32/39]
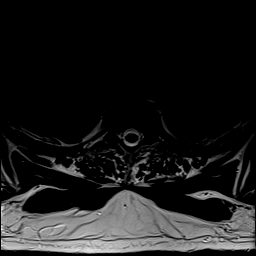
[im 39/39]
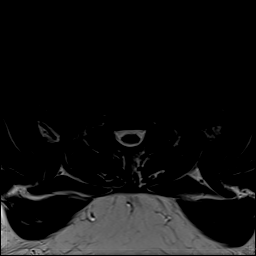

[Series 21: GRE · axial · 4.0mm · 0.37mm/px · z∈[-385,-237]mm · 5 of 39 slices shown]
[im 1/39]
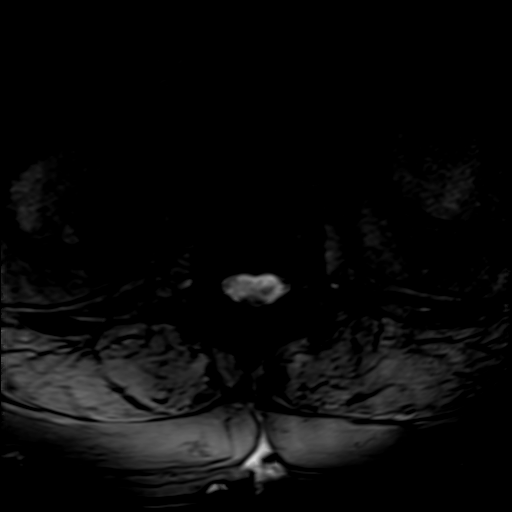
[im 7/39]
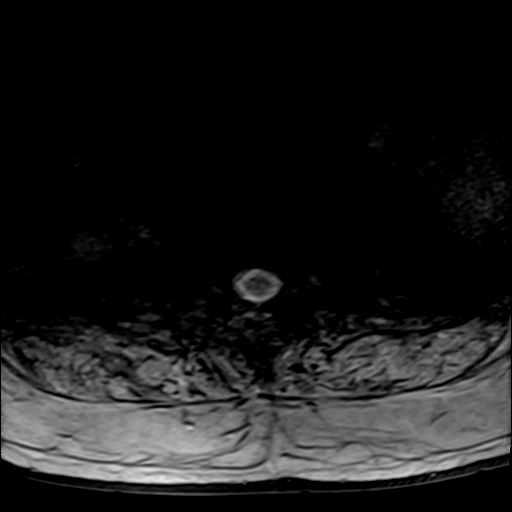
[im 13/39]
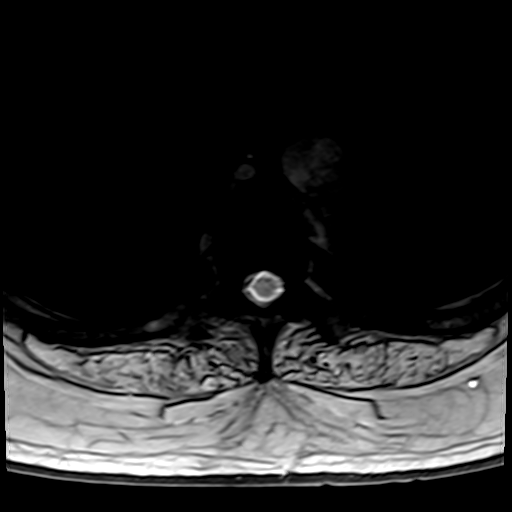
[im 16/39]
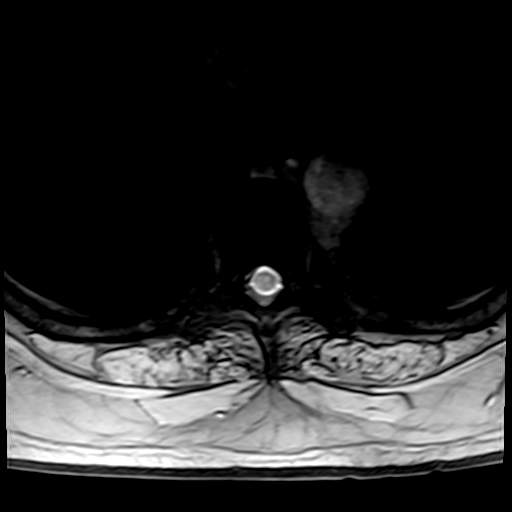
[im 23/39]
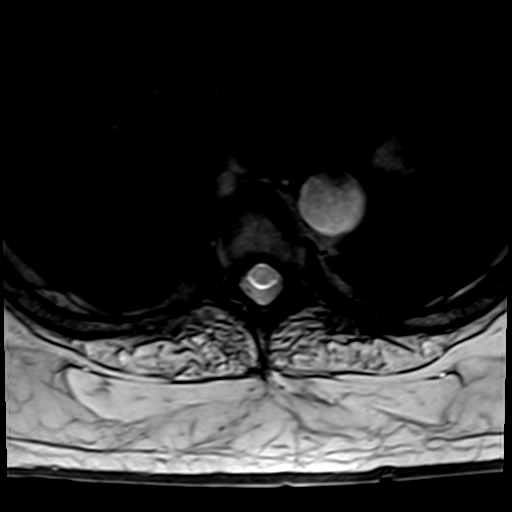

[36 of 48 positions shown; findings below may reference images not displayed]

FINDINGS: Alignment:  Physiologic.

Vertebrae: Minimal chronic compression fracture of the superior
endplate of L1. Endplate degenerative changes at T11-12. Marrow
edema in the facet joints at T11-12, extending to the corresponding
pedicles into the T11 spinous process, related to facet
degeneration. Susceptibility artifact from postsurgical changes at
the we will L1 level, only partially studied. No acute fracture,
evidence of discitis, or bone lesion.

Cord:  Normal signal and morphology.

Paraspinal and other soft tissues: Negative.

Disc levels:

C7-T1 through T6-7: No spinal canal or neural foraminal stenosis.

T7-8: Small posterior disc protrusion. No spinal canal or neural
foraminal stenosis.

T8-9: Small posterior disc protrusion and facet degenerative changes
resulting in mild narrowing of the right neural foramen.

T9-10: Right-sided posterior disc protrusion causing indentation on
the thecal sac and resulting in mild narrowing of the right neural
foramen. No significant spinal canal stenosis.

T10-11: Facet degenerative changes resulting in mild bilateral
neural foraminal narrowing. There is no spinal canal stenosis.

T11-12: Small posterior disc protrusion, prominent facet
degenerative changes with ligamentum flavum redundancy and
periarticular edema. Findings result in mild spinal canal stenosis
and severe bilateral neural foraminal narrowing.

T12-L1: Minimal retropulsion of the superior aspect of the posterior
wall causing small indentation of the thecal sac. There is no spinal
canal or neural foraminal stenosis.
IMPRESSION: 1. Severe facet arthrosis at T11-12 with periarticular edema with
mild spinal canal stenosis and severe bilateral neural foraminal
narrowing at this level.
2. No high-grade spinal canal stenosis or cord impingement at any
level.

## 2020-05-17 ENCOUNTER — Telehealth: Payer: Self-pay

## 2020-05-18 NOTE — Telephone Encounter (Signed)
The patient called in yesterday stating he had broken a rib coughing. He wanted to come in to see Dr. Andree Elk in person regarding his medicines but I told him Dr. Andree Elk wasn't here this week. And he didn't have any open f79f spots until his original appt. He decided to keep his virtual appt with Dr. Andree Elk but r/s to an earlier date.

## 2020-05-26 ENCOUNTER — Telehealth: Payer: Self-pay | Admitting: Anesthesiology

## 2020-05-26 ENCOUNTER — Encounter: Payer: Self-pay | Admitting: Anesthesiology

## 2020-05-26 ENCOUNTER — Ambulatory Visit: Payer: Medicaid Other | Attending: Anesthesiology | Admitting: Anesthesiology

## 2020-05-26 ENCOUNTER — Other Ambulatory Visit: Payer: Self-pay

## 2020-05-26 DIAGNOSIS — M25552 Pain in left hip: Secondary | ICD-10-CM

## 2020-05-26 DIAGNOSIS — M47816 Spondylosis without myelopathy or radiculopathy, lumbar region: Secondary | ICD-10-CM

## 2020-05-26 DIAGNOSIS — G894 Chronic pain syndrome: Secondary | ICD-10-CM

## 2020-05-26 DIAGNOSIS — F1910 Other psychoactive substance abuse, uncomplicated: Secondary | ICD-10-CM

## 2020-05-26 DIAGNOSIS — M25551 Pain in right hip: Secondary | ICD-10-CM

## 2020-05-26 DIAGNOSIS — M5432 Sciatica, left side: Secondary | ICD-10-CM

## 2020-05-26 DIAGNOSIS — M51369 Other intervertebral disc degeneration, lumbar region without mention of lumbar back pain or lower extremity pain: Secondary | ICD-10-CM

## 2020-05-26 DIAGNOSIS — M5136 Other intervertebral disc degeneration, lumbar region: Secondary | ICD-10-CM

## 2020-05-26 DIAGNOSIS — M5431 Sciatica, right side: Secondary | ICD-10-CM

## 2020-05-26 DIAGNOSIS — F1190 Opioid use, unspecified, uncomplicated: Secondary | ICD-10-CM

## 2020-05-26 DIAGNOSIS — M961 Postlaminectomy syndrome, not elsewhere classified: Secondary | ICD-10-CM

## 2020-05-26 MED ORDER — HYDROCODONE-ACETAMINOPHEN 5-325 MG PO TABS: 1.0000 | ORAL_TABLET | Freq: Two times a day (BID) | ORAL | 0 refills | Status: DC | PRN

## 2020-05-26 MED ORDER — NALOXONE HCL 4 MG/0.1ML NA LIQD: NASAL | 2 refills | Status: AC

## 2020-05-26 MED ORDER — FENTANYL 75 MCG/HR TD PT72
1.0000 | MEDICATED_PATCH | TRANSDERMAL | 0 refills | Status: DC
Start: 2020-05-28 — End: 2020-06-24

## 2020-05-26 MED ORDER — FENTANYL 75 MCG/HR TD PT72: 1.0000 | MEDICATED_PATCH | TRANSDERMAL | 0 refills | Status: DC

## 2020-05-26 NOTE — Telephone Encounter (Signed)
Patient states his meds will need approval for high dose as well as regular medicaid approval. Says pharmacy is supposed to send a note about this.

## 2020-05-26 NOTE — Progress Notes (Signed)
Virtual Visit via Telephone Note  I connected with Jeremy Long on 05/26/20 at  2:15 PM EST by telephone and verified that I am speaking with the correct person using two identifiers.  Location: Patient: Home Provider: Pain control center   I discussed the limitations, risks, security and privacy concerns of performing an evaluation and management service by telephone and the availability of in person appointments. I also discussed with the patient that there may be a patient responsible charge related to this service. The patient expressed understanding and agreed to proceed.   History of Present Illness: I spoke with Jeremy Long today via telephone as he is unable to do the video portion of the virtual conference.  He reports that he is having a considerable amount of thoracic and lumbar pain.  This has been more of frequent and severe the last few weeks.  He has a lot of spasming and notes that he does very well with his first day on the Duragesic patch but by day two he has a lot of breakthrough pain.  The quality characteristic and distribution of this has been stable otherwise.  No problems with the Duragesic patch are noted.  He tolerates this very well and it gives him great relief the first day but weans and falls off thereafter.  No change in bowel or bladder function or lower extremity strength or function at this time.  Otherwise has been in his usual state of health.  He is trying to do his stretching strengthening activities as tolerated and this also seems to exacerbate some of his pain.   Observations/Objective:  Current Outpatient Medications:  .  ACCU-CHEK GUIDE test strip, 1 EACH (1 STRIP TOTAL) BY XX ROUTE 3 (THREE) TIMES DAILY USE AS INSTRUCTED., Disp: , Rfl:  .  Accu-Chek Softclix Lancets lancets, 1 each 3 (three) times daily., Disp: , Rfl:  .  albuterol (VENTOLIN HFA) 108 (90 Base) MCG/ACT inhaler, SMARTSIG:2 Puff(s) By Mouth Every 6 Hours PRN, Disp: , Rfl:  .  allopurinol  (ZYLOPRIM) 100 MG tablet, TAKE 1 TABLET BY MOUTH EVERY DAY, Disp: , Rfl:  .  Blood Glucose Monitoring Suppl (ACCU-CHEK GUIDE) w/Device KIT, See admin instructions., Disp: , Rfl:  .  [START ON 05/28/2020] fentaNYL (DURAGESIC) 75 MCG/HR, Place 1 patch onto the skin every other day., Disp: 15 patch, Rfl: 0 .  [START ON 06/27/2020] fentaNYL (DURAGESIC) 75 MCG/HR, Place 1 patch onto the skin every other day., Disp: 15 patch, Rfl: 0 .  FLUoxetine (PROZAC) 20 MG capsule, Take 20 mg by mouth every morning. (Patient not taking: Reported on 02/16/2020), Disp: , Rfl:  .  GAVILAX 17 GM/SCOOP powder, TAKE AS DIRECTED FOR COLONIC PREP. (Patient not taking: Reported on 02/16/2020), Disp: , Rfl:  .  glipiZIDE (GLUCOTROL XL) 10 MG 24 hr tablet, Take by mouth., Disp: , Rfl:  .  glipiZIDE (GLUCOTROL XL) 5 MG 24 hr tablet, Take 5 mg by mouth daily. (Patient not taking: Reported on 02/16/2020), Disp: , Rfl:  .  Glycopyrrolate-Formoterol (BEVESPI AEROSPHERE) 9-4.8 MCG/ACT AERO, Inhale into the lungs., Disp: , Rfl:  .  HYDROcodone-acetaminophen (NORCO/VICODIN) 5-325 MG tablet, Take 1 tablet by mouth 2 (two) times daily as needed for moderate pain or severe pain., Disp: 60 tablet, Rfl: 0 .  ibuprofen (ADVIL) 600 MG tablet, Take 600 mg by mouth every 6 (six) hours as needed., Disp: , Rfl:  .  lisinopril (ZESTRIL) 10 MG tablet, Take 10 mg by mouth daily., Disp: , Rfl:  .  LORazepam (ATIVAN) 1 MG tablet, Take 1 mg by mouth 3 (three) times daily as needed. (Patient not taking: Reported on 02/16/2020), Disp: , Rfl: 0 .  naproxen (NAPROSYN) 250 MG tablet, Take 1 tablet (250 mg total) by mouth 3 (three) times daily with meals. (Patient not taking: Reported on 07/09/2019), Disp: 30 tablet, Rfl: 0 .  pioglitazone (ACTOS) 30 MG tablet, Take by mouth. (Patient not taking: Reported on 02/16/2020), Disp: , Rfl:  .  predniSONE (DELTASONE) 50 MG tablet, Take one 50 mg tablet once a day for the next five days. (Patient not taking: Reported on  07/09/2019), Disp: 5 tablet, Rfl: 0 .  sildenafil (REVATIO) 20 MG tablet, TAKE 1 - 3 TABLETS AS DIRECTED. (Patient not taking: Reported on 02/16/2020), Disp: , Rfl: 3 .  varenicline (CHANTIX) 1 MG tablet, TAKE 1 TABLET (1 MG TOTAL) BY MOUTH 2 (TWO) TIMES DAILY FOR 30 DAYS, Disp: , Rfl:   Assessment and Plan: 1. Chronic pain syndrome   2. Chronic, continuous use of opioids   3. DDD (degenerative disc disease), lumbar   4. Drug abuse ([place])   5. Facet arthritis of lumbar region   6. Post laminectomy syndrome   7. Bilateral sciatica   8. Bilateral hip pain   9. Facet arthropathy, lumbar   Based on our discussion today and upon review of the [facility] practitioner database information one refill his medicines for the next 2 months.  This will be for the Duragesic 75 mcg patch to be taken every second day.  Furthermore for day two I am going to start him on a hydrocodone 5 mg tablet to be taken up to twice a day as needed for breakthrough pain.  I will schedule him for a 1 month return to clinic.  He has been instructed to contact us should he have any problems with the pain management regimen or changes in his opioid medications.  I encouraged him to follow-up with his primary care physicians and also to continue with the physical therapy exercises as tolerated.  Follow Up Instructions:    I discussed the assessment and treatment plan with the patient. The patient was provided an opportunity to ask questions and all were answered. The patient agreed with the plan and demonstrated an understanding of the instructions.   The patient was advised to call back or seek an in-person evaluation if the symptoms worsen or if the condition fails to improve as anticipated.  I provided 30 minutes of non-face-to-face time during this encounter.   Molli Barrows, MD

## 2020-05-27 ENCOUNTER — Telehealth: Payer: Self-pay

## 2020-05-27 NOTE — Telephone Encounter (Signed)
He states the pharmacy wont fill his meds, the pharmacy states we know what to do for prior authorization. He says this is insanity and he needs his medicines.

## 2020-05-27 NOTE — Telephone Encounter (Signed)
This PA was done the same day it was prescribed 05/26/2020. I called the patient and assured him that our office has done our part and it is now up to his insurance and the pharmacy. He states he understands and will follow up with his pharmacy.

## 2020-05-31 ENCOUNTER — Telehealth: Payer: Self-pay | Admitting: Anesthesiology

## 2020-05-31 NOTE — Telephone Encounter (Signed)
Patient has [place] Medicaid [place] Access

## 2020-05-31 NOTE — Telephone Encounter (Signed)
Patient had to pay for his Hydrocodone, wanted to know what happen with the Prior auth, says it needed approval for High Dose as well as regular prior auth.

## 2020-05-31 NOTE — Telephone Encounter (Signed)
CMM shows that the PA was submitted. I attempted to call the number, the voicemail prompts me to choose which managed care Medicaid the patient has. I do not have this information. Attempted to call patient to ask him, message left.

## 2020-06-02 ENCOUNTER — Telehealth: Payer: Self-pay

## 2020-06-02 NOTE — Telephone Encounter (Signed)
Patient called saying Dena had asked him for a phone number to his insurance. The phone number is [phone]

## 2020-06-03 ENCOUNTER — Telehealth: Payer: Medicaid Other | Admitting: Anesthesiology

## 2020-06-14 ENCOUNTER — Other Ambulatory Visit: Payer: Self-pay | Admitting: Specialist

## 2020-06-14 ENCOUNTER — Other Ambulatory Visit (HOSPITAL_COMMUNITY): Payer: Self-pay | Admitting: Specialist

## 2020-06-14 DIAGNOSIS — J439 Emphysema, unspecified: Secondary | ICD-10-CM

## 2020-06-14 DIAGNOSIS — R0609 Other forms of dyspnea: Secondary | ICD-10-CM

## 2020-06-21 ENCOUNTER — Telehealth: Payer: Self-pay | Admitting: Anesthesiology

## 2020-06-21 NOTE — Telephone Encounter (Signed)
Patient changed pharmacy to [facility]

## 2020-06-23 ENCOUNTER — Other Ambulatory Visit: Payer: Self-pay

## 2020-06-23 ENCOUNTER — Ambulatory Visit: Payer: Medicaid Other | Attending: Anesthesiology | Admitting: Anesthesiology

## 2020-06-23 DIAGNOSIS — G894 Chronic pain syndrome: Secondary | ICD-10-CM

## 2020-06-23 DIAGNOSIS — M5432 Sciatica, left side: Secondary | ICD-10-CM

## 2020-06-23 DIAGNOSIS — F1910 Other psychoactive substance abuse, uncomplicated: Secondary | ICD-10-CM | POA: Diagnosis not present

## 2020-06-23 DIAGNOSIS — F1190 Opioid use, unspecified, uncomplicated: Secondary | ICD-10-CM | POA: Diagnosis not present

## 2020-06-23 DIAGNOSIS — M25551 Pain in right hip: Secondary | ICD-10-CM

## 2020-06-23 DIAGNOSIS — M961 Postlaminectomy syndrome, not elsewhere classified: Secondary | ICD-10-CM

## 2020-06-23 DIAGNOSIS — M47816 Spondylosis without myelopathy or radiculopathy, lumbar region: Secondary | ICD-10-CM

## 2020-06-23 DIAGNOSIS — M5431 Sciatica, right side: Secondary | ICD-10-CM

## 2020-06-23 DIAGNOSIS — M5136 Other intervertebral disc degeneration, lumbar region: Secondary | ICD-10-CM | POA: Diagnosis not present

## 2020-06-23 DIAGNOSIS — M25552 Pain in left hip: Secondary | ICD-10-CM

## 2020-06-23 DIAGNOSIS — M51369 Other intervertebral disc degeneration, lumbar region without mention of lumbar back pain or lower extremity pain: Secondary | ICD-10-CM

## 2020-06-24 ENCOUNTER — Telehealth: Payer: Self-pay

## 2020-06-24 ENCOUNTER — Encounter: Payer: Self-pay | Admitting: Anesthesiology

## 2020-06-24 MED ORDER — FENTANYL 75 MCG/HR TD PT72
1.0000 | MEDICATED_PATCH | TRANSDERMAL | 0 refills | Status: DC
Start: 2020-06-26 — End: 2020-07-21

## 2020-06-24 MED ORDER — HYDROCODONE-ACETAMINOPHEN 5-325 MG PO TABS
1.0000 | ORAL_TABLET | Freq: Two times a day (BID) | ORAL | 0 refills | Status: DC | PRN
Start: 2020-06-26 — End: 2020-07-21

## 2020-06-24 NOTE — Telephone Encounter (Signed)
He changed pharmacy to [facility] because he had an issue with the pharmacy tech at [place]. Now [name] cant get the brand of fentanyl patches he wants. He prefers a specific one because the other ones dont have medicine in them.  He states he will be changing his pharmacy again by next month. He will let us know, he is thinking [facility] in [place] maybe.

## 2020-06-24 NOTE — Progress Notes (Signed)
There were no vitals taken for this visit.  Virtual Visit via Telephone Note  I connected with Jeremy Long on 06/24/20 at  3:05 PM EST by telephone and verified that I am speaking with the correct person using two identifiers.  Location: Patient: Home Provider: Pain control center   I discussed the limitations, risks, security and privacy concerns of performing an evaluation and management service by telephone and the availability of in person appointments. I also discussed with the patient that there may be a patient responsible charge related to this service. The patient expressed understanding and agreed to proceed.   History of Present Illness: I spoke with Jeremy Long via telephone today as he was unable to do the video portion of the virtual conference.  He is just had some dental extraction work done.  He mentions that his back pain has been stable in nature and he still taking his medications as prescribed and this is working well for him.  He is presently taking the Duragesic patch every other day and hydrocodone for breakthrough pain.  This combination seems to be working well.  He did recently report that he saw Dr. Raul Del for a chronic cough and was prescribed a cough suppressant with hydrocodone.  He is no longer using that.  Otherwise he is in his usual state of health and no new changes in the quality characteristic or distribution of his low back pain and diffuse pain are noted.  Strength is been at baseline and bowel bladder function has been stable.  He states that he derives good functional lifestyle improvement with the medications and no side effects are reported.   Observations/Objective:  Current Outpatient Medications:  .  ACCU-CHEK GUIDE test strip, 1 EACH (1 STRIP TOTAL) BY XX ROUTE 3 (THREE) TIMES DAILY USE AS INSTRUCTED., Disp: , Rfl:  .  Accu-Chek Softclix Lancets lancets, 1 each 3 (three) times daily., Disp: , Rfl:  .  albuterol (VENTOLIN HFA) 108 (90 Base)  MCG/ACT inhaler, SMARTSIG:2 Puff(s) By Mouth Every 6 Hours PRN, Disp: , Rfl:  .  allopurinol (ZYLOPRIM) 100 MG tablet, TAKE 1 TABLET BY MOUTH EVERY DAY, Disp: , Rfl:  .  Blood Glucose Monitoring Suppl (ACCU-CHEK GUIDE) w/Device KIT, See admin instructions., Disp: , Rfl:  .  [START ON 06/26/2020] fentaNYL (DURAGESIC) 75 MCG/HR, Place 1 patch onto the skin every other day., Disp: 15 patch, Rfl: 0 .  FLUoxetine (PROZAC) 20 MG capsule, Take 20 mg by mouth every morning. (Patient not taking: Reported on 02/16/2020), Disp: , Rfl:  .  GAVILAX 17 GM/SCOOP powder, TAKE AS DIRECTED FOR COLONIC PREP. (Patient not taking: Reported on 02/16/2020), Disp: , Rfl:  .  glipiZIDE (GLUCOTROL XL) 10 MG 24 hr tablet, Take by mouth., Disp: , Rfl:  .  glipiZIDE (GLUCOTROL XL) 5 MG 24 hr tablet, Take 5 mg by mouth daily. (Patient not taking: Reported on 02/16/2020), Disp: , Rfl:  .  Glycopyrrolate-Formoterol (BEVESPI AEROSPHERE) 9-4.8 MCG/ACT AERO, Inhale into the lungs., Disp: , Rfl:  .  [START ON 06/26/2020] HYDROcodone-acetaminophen (NORCO/VICODIN) 5-325 MG tablet, Take 1 tablet by mouth 2 (two) times daily as needed for moderate pain or severe pain., Disp: 60 tablet, Rfl: 0 .  ibuprofen (ADVIL) 600 MG tablet, Take 600 mg by mouth every 6 (six) hours as needed., Disp: , Rfl:  .  lisinopril (ZESTRIL) 10 MG tablet, Take 10 mg by mouth daily., Disp: , Rfl:  .  LORazepam (ATIVAN) 1 MG tablet, Take 1 mg by mouth  3 (three) times daily as needed. (Patient not taking: Reported on 02/16/2020), Disp: , Rfl: 0 .  naloxone (NARCAN) nasal spray 4 mg/0.1 mL, As directed for opioid induced respiratory depression, Disp: 1 each, Rfl: 2 .  naproxen (NAPROSYN) 250 MG tablet, Take 1 tablet (250 mg total) by mouth 3 (three) times daily with meals. (Patient not taking: Reported on 07/09/2019), Disp: 30 tablet, Rfl: 0 .  pioglitazone (ACTOS) 30 MG tablet, Take by mouth. (Patient not taking: Reported on 02/16/2020), Disp: , Rfl:  .  predniSONE (DELTASONE)  50 MG tablet, Take one 50 mg tablet once a day for the next five days. (Patient not taking: Reported on 07/09/2019), Disp: 5 tablet, Rfl: 0 .  sildenafil (REVATIO) 20 MG tablet, TAKE 1 - 3 TABLETS AS DIRECTED. (Patient not taking: Reported on 02/16/2020), Disp: , Rfl: 3 .  varenicline (CHANTIX) 1 MG tablet, TAKE 1 TABLET (1 MG TOTAL) BY MOUTH 2 (TWO) TIMES DAILY FOR 30 DAYS, Disp: , Rfl:   Assessment and Plan: 1. Chronic pain syndrome   2. Chronic, continuous use of opioids   3. DDD (degenerative disc disease), lumbar   4. Drug abuse ([place])   5. Facet arthritis of lumbar region   6. Post laminectomy syndrome   7. Bilateral sciatica   8. Bilateral hip pain   9. Facet arthropathy, lumbar   Based on our discussion today and upon review of the [facility] practitioner database information I feel it appropriate to refill his medication.  We will make a pharmacy change for his convenience.  These will be sent in for pickup on December 11.  I will schedule him for a 1 month return evaluation.  He has been instructed to contact us should he have any changes in his pain status in the meantime and I have encouraged him to continue follow-up with his primary care physicians for his baseline medical care.  Follow Up Instructions:    I discussed the assessment and treatment plan with the patient. The patient was provided an opportunity to ask questions and all were answered. The patient agreed with the plan and demonstrated an understanding of the instructions.   The patient was advised to call back or seek an in-person evaluation if the symptoms worsen or if the condition fails to improve as anticipated.  I provided 30 minutes of non-face-to-face time during this encounter.   Molli Barrows, MD

## 2020-06-24 NOTE — Telephone Encounter (Signed)
He left a vm stating he was supposed to have a vv with [place] yesterday and didn't get a call.

## 2020-06-24 NOTE — Telephone Encounter (Signed)
The patient called and wanted to make sure that the nurse knew what to do as far as prior authorization to get his medicine on time. He said last month he ended up having to pay cash for his meds and he wanted to make sure that doesn't happen again.

## 2020-06-24 NOTE — Telephone Encounter (Signed)
Notified patient that we would have Dr Andree Elk call him this afternoon for his appointment.

## 2020-06-25 ENCOUNTER — Other Ambulatory Visit: Payer: Self-pay

## 2020-06-25 ENCOUNTER — Ambulatory Visit
Admission: RE | Admit: 2020-06-25 | Discharge: 2020-06-25 | Disposition: A | Discharge status: Home / Self Care | Payer: Medicaid Other | Source: Ambulatory Visit | Attending: Specialist | Admitting: Specialist

## 2020-06-25 DIAGNOSIS — R0600 Dyspnea, unspecified: Secondary | ICD-10-CM | POA: Insufficient documentation

## 2020-06-25 DIAGNOSIS — J439 Emphysema, unspecified: Secondary | ICD-10-CM | POA: Insufficient documentation

## 2020-06-25 DIAGNOSIS — R0609 Other forms of dyspnea: Secondary | ICD-10-CM

## 2020-07-21 ENCOUNTER — Other Ambulatory Visit: Payer: Self-pay

## 2020-07-21 ENCOUNTER — Encounter: Payer: Self-pay | Admitting: Anesthesiology

## 2020-07-21 ENCOUNTER — Ambulatory Visit: Payer: Medicaid Other | Attending: Anesthesiology | Admitting: Anesthesiology

## 2020-07-21 DIAGNOSIS — F1910 Other psychoactive substance abuse, uncomplicated: Secondary | ICD-10-CM | POA: Diagnosis not present

## 2020-07-21 DIAGNOSIS — M5432 Sciatica, left side: Secondary | ICD-10-CM

## 2020-07-21 DIAGNOSIS — M5431 Sciatica, right side: Secondary | ICD-10-CM

## 2020-07-21 DIAGNOSIS — M5136 Other intervertebral disc degeneration, lumbar region: Secondary | ICD-10-CM | POA: Diagnosis not present

## 2020-07-21 DIAGNOSIS — M47816 Spondylosis without myelopathy or radiculopathy, lumbar region: Secondary | ICD-10-CM

## 2020-07-21 DIAGNOSIS — F1190 Opioid use, unspecified, uncomplicated: Secondary | ICD-10-CM | POA: Diagnosis not present

## 2020-07-21 DIAGNOSIS — G894 Chronic pain syndrome: Secondary | ICD-10-CM

## 2020-07-21 DIAGNOSIS — M25552 Pain in left hip: Secondary | ICD-10-CM

## 2020-07-21 DIAGNOSIS — M25551 Pain in right hip: Secondary | ICD-10-CM

## 2020-07-21 DIAGNOSIS — M961 Postlaminectomy syndrome, not elsewhere classified: Secondary | ICD-10-CM

## 2020-07-21 MED ORDER — HYDROCODONE-ACETAMINOPHEN 5-325 MG PO TABS
1.0000 | ORAL_TABLET | Freq: Two times a day (BID) | ORAL | 0 refills | Status: AC | PRN
Start: 2020-07-26 — End: 2020-08-25

## 2020-07-21 MED ORDER — FENTANYL 75 MCG/HR TD PT72
1.0000 | MEDICATED_PATCH | TRANSDERMAL | 0 refills | Status: AC
Start: 2020-07-26 — End: 2020-08-25

## 2020-07-21 NOTE — Progress Notes (Signed)
Virtual Visit via Telephone Note  I connected with Jeremy Long on 07/21/20 at  1:20 PM EST by telephone and verified that I am speaking with the correct person using two identifiers.  Location: Patient: Home Provider: Pain control center   I discussed the limitations, risks, security and privacy concerns of performing an evaluation and management service by telephone and the availability of in person appointments. I also discussed with the patient that there may be a patient responsible charge related to this service. The patient expressed understanding and agreed to proceed.   History of Present Illness:   I spoke with [place] regarding his low back pain and leg pain.  This been stable and has been doing well with his current regimen.  He reports that he recently switched pharmacies and the new make-up of his Duragesic patch is causing him to have some sore throat issues.  He is currently trying to [facility] [place].  Despite this the medicines do work well for him and he continues to get good relief.  He also is using the hydrocodone successfully approximately twice a day for breakthrough pain and this regimen seems to be keeping his low back pain and leg pain stable and well managed.  No side effects are reported.  He continues to derive good functional benefit from the medications without side effect as mentioned.  The quality characteristic and distribution of the pain have also been stable with no change in lower extremity strength or function.  Otherwise has been doing reasonably well he reports Observations/Objective:  Current Outpatient Medications:  .  ACCU-CHEK GUIDE test strip, 1 EACH (1 STRIP TOTAL) BY XX ROUTE 3 (THREE) TIMES DAILY USE AS INSTRUCTED., Disp: , Rfl:  .  Accu-Chek Softclix Lancets lancets, 1 each 3 (three) times daily., Disp: , Rfl:  .  albuterol (VENTOLIN HFA) 108 (90 Base) MCG/ACT inhaler, SMARTSIG:2 Puff(s) By Mouth Every 6 Hours PRN,  Disp: , Rfl:  .  allopurinol (ZYLOPRIM) 100 MG tablet, TAKE 1 TABLET BY MOUTH EVERY DAY, Disp: , Rfl:  .  Blood Glucose Monitoring Suppl (ACCU-CHEK GUIDE) w/Device KIT, See admin instructions., Disp: , Rfl:  .  [START ON 07/26/2020] fentaNYL (DURAGESIC) 75 MCG/HR, Place 1 patch onto the skin every other day., Disp: 15 patch, Rfl: 0 .  FLUoxetine (PROZAC) 20 MG capsule, Take 20 mg by mouth every morning. (Patient not taking: Reported on 02/16/2020), Disp: , Rfl:  .  GAVILAX 17 GM/SCOOP powder, TAKE AS DIRECTED FOR COLONIC PREP. (Patient not taking: Reported on 02/16/2020), Disp: , Rfl:  .  glipiZIDE (GLUCOTROL XL) 10 MG 24 hr tablet, Take by mouth., Disp: , Rfl:  .  glipiZIDE (GLUCOTROL XL) 5 MG 24 hr tablet, Take 5 mg by mouth daily. (Patient not taking: Reported on 02/16/2020), Disp: , Rfl:  .  Glycopyrrolate-Formoterol (BEVESPI AEROSPHERE) 9-4.8 MCG/ACT AERO, Inhale into the lungs., Disp: , Rfl:  .  [START ON 07/26/2020] HYDROcodone-acetaminophen (NORCO/VICODIN) 5-325 MG tablet, Take 1 tablet by mouth 2 (two) times daily as needed for moderate pain or severe pain., Disp: 60 tablet, Rfl: 0 .  ibuprofen (ADVIL) 600 MG tablet, Take 600 mg by mouth every 6 (six) hours as needed., Disp: , Rfl:  .  lisinopril (ZESTRIL) 10 MG tablet, Take 10 mg by mouth daily., Disp: , Rfl:  .  LORazepam (ATIVAN) 1 MG tablet, Take 1 mg by mouth 3 (three) times daily as needed. (Patient not taking: Reported on 02/16/2020), Disp: ,  Rfl: 0 .  naloxone (NARCAN) nasal spray 4 mg/0.1 mL, As directed for opioid induced respiratory depression, Disp: 1 each, Rfl: 2 .  naproxen (NAPROSYN) 250 MG tablet, Take 1 tablet (250 mg total) by mouth 3 (three) times daily with meals. (Patient not taking: Reported on 07/09/2019), Disp: 30 tablet, Rfl: 0 .  pioglitazone (ACTOS) 30 MG tablet, Take by mouth. (Patient not taking: Reported on 02/16/2020), Disp: , Rfl:  .  predniSONE (DELTASONE) 50 MG tablet, Take one 50 mg tablet once a day for the next five  days. (Patient not taking: Reported on 07/09/2019), Disp: 5 tablet, Rfl: 0 .  sildenafil (REVATIO) 20 MG tablet, TAKE 1 - 3 TABLETS AS DIRECTED. (Patient not taking: Reported on 02/16/2020), Disp: , Rfl: 3 .  varenicline (CHANTIX) 1 MG tablet, TAKE 1 TABLET (1 MG TOTAL) BY MOUTH 2 (TWO) TIMES DAILY FOR 30 DAYS, Disp: , Rfl:   Assessment and Plan: 1. Chronic pain syndrome   2. Chronic, continuous use of opioids   3. DDD (degenerative disc disease), lumbar   4. Drug abuse ([place])   5. Facet arthritis of lumbar region   6. Post laminectomy syndrome   7. Bilateral sciatica   8. Bilateral hip pain   9. Facet arthropathy, lumbar   Based on our discussion today and upon review of the [facility] practitioner database information going to refill his medications for the next month.  He has a complicated history and ongoing follow-up with him in 1 month.  Based on his report today he continues to get good relief from the medications and we will make the pharmacy changed as mentioned.  I have encouraged him to continue with stretching strengthening exercises and aerobic conditioning as reviewed today.  I also encouraged him to continue follow-up with his primary care physicians for his baseline medical care with scheduled reappointment in 1 month.  Follow Up Instructions:    I discussed the assessment and treatment plan with the patient. The patient was provided an opportunity to ask questions and all were answered. The patient agreed with the plan and demonstrated an understanding of the instructions.   The patient was advised to call back or seek an in-person evaluation if the symptoms worsen or if the condition fails to improve as anticipated.  I provided 30 minutes of non-face-to-face time during this encounter.   Molli Barrows, MD

## 2020-07-26 ENCOUNTER — Telehealth: Payer: Self-pay | Admitting: *Deleted

## 2020-07-26 NOTE — Telephone Encounter (Signed)
Spoke with patient to let him know that orginial PA was sent in with wrong form.  Have resent with records.  Patient verbalizes u/o information.

## 2020-08-23 ENCOUNTER — Encounter: Payer: Self-pay | Admitting: Anesthesiology

## 2020-08-23 ENCOUNTER — Ambulatory Visit (HOSPITAL_BASED_OUTPATIENT_CLINIC_OR_DEPARTMENT_OTHER): Payer: Medicaid Other | Admitting: Anesthesiology

## 2020-08-23 DIAGNOSIS — M47816 Spondylosis without myelopathy or radiculopathy, lumbar region: Secondary | ICD-10-CM

## 2020-08-23 DIAGNOSIS — F1910 Other psychoactive substance abuse, uncomplicated: Secondary | ICD-10-CM

## 2020-08-23 DIAGNOSIS — M545 Low back pain, unspecified: Secondary | ICD-10-CM

## 2020-08-23 DIAGNOSIS — F1190 Opioid use, unspecified, uncomplicated: Secondary | ICD-10-CM

## 2020-08-23 DIAGNOSIS — M5431 Sciatica, right side: Secondary | ICD-10-CM

## 2020-08-23 DIAGNOSIS — M25551 Pain in right hip: Secondary | ICD-10-CM

## 2020-08-23 DIAGNOSIS — M5432 Sciatica, left side: Secondary | ICD-10-CM

## 2020-08-23 DIAGNOSIS — M961 Postlaminectomy syndrome, not elsewhere classified: Secondary | ICD-10-CM

## 2020-08-23 DIAGNOSIS — G894 Chronic pain syndrome: Secondary | ICD-10-CM | POA: Diagnosis not present

## 2020-08-23 DIAGNOSIS — M5136 Other intervertebral disc degeneration, lumbar region: Secondary | ICD-10-CM

## 2020-08-23 DIAGNOSIS — M25552 Pain in left hip: Secondary | ICD-10-CM

## 2020-08-23 DIAGNOSIS — G8929 Other chronic pain: Secondary | ICD-10-CM

## 2020-08-23 MED ORDER — MORPHINE SULFATE ER 60 MG PO TBCR
60.0000 mg | EXTENDED_RELEASE_TABLET | Freq: Three times a day (TID) | ORAL | 0 refills | Status: DC
Start: 2020-08-26 — End: 2021-07-20

## 2020-08-23 NOTE — Progress Notes (Signed)
Virtual Visit via Telephone Note  I connected with Jeremy Long on 08/23/20 at  1:25 PM EST by telephone and verified that I am speaking with the correct person using two identifiers.  Location: Patient: Home Provider: Pain control center   I discussed the limitations, risks, security and privacy concerns of performing an evaluation and management service by telephone and the availability of in person appointments. I also discussed with the patient that there may be a patient responsible charge related to this service. The patient expressed understanding and agreed to proceed.   History of Present Illness:   I spoke with Jeremy Long via telephone as he was unable to do the video portion of the virtual conference.  He reports that he has been doing reasonably well with the pain control other than having some side effects with the Duragesic patch.  He has reported breakthrough early with utilization of the fentanyl patch in the past.  He has been on Opana with good control several years ago.  He expresses interest in switching from the patch over to an oral dosing.  At present he has had problems with coughing and some throat irritation he attributes to the Duragesic patch.  Furthermore he states that he gets good relief on day 1 but still has breakthrough by day 2 and has some withdrawal type side effects of a mild nature on day 2.  Otherwise the quality characteristic and distribution of his diffuse pain has been stable in nature.  No changes in lower extremity strength or function or bowel or bladder function noted. Observations/Objective:  Current Outpatient Medications:  .  [START ON 08/26/2020] morphine (MS CONTIN) 60 MG 12 hr tablet, Take 1 tablet (60 mg total) by mouth every 8 (eight) hours. Withdraw and discard applied fentanyl patch 12 hours prior to initiating new opioid therapy., Disp: 90 tablet, Rfl: 0 .  ACCU-CHEK GUIDE test strip, 1 EACH (1 STRIP TOTAL) BY XX ROUTE 3 (THREE)  TIMES DAILY USE AS INSTRUCTED., Disp: , Rfl:  .  Accu-Chek Softclix Lancets lancets, 1 each 3 (three) times daily., Disp: , Rfl:  .  albuterol (VENTOLIN HFA) 108 (90 Base) MCG/ACT inhaler, SMARTSIG:2 Puff(s) By Mouth Every 6 Hours PRN, Disp: , Rfl:  .  allopurinol (ZYLOPRIM) 100 MG tablet, TAKE 1 TABLET BY MOUTH EVERY DAY, Disp: , Rfl:  .  Blood Glucose Monitoring Suppl (ACCU-CHEK GUIDE) w/Device KIT, See admin instructions., Disp: , Rfl:  .  fentaNYL (DURAGESIC) 75 MCG/HR, Place 1 patch onto the skin every other day., Disp: 15 patch, Rfl: 0 .  FLUoxetine (PROZAC) 20 MG capsule, Take 20 mg by mouth every morning. (Patient not taking: Reported on 02/16/2020), Disp: , Rfl:  .  GAVILAX 17 GM/SCOOP powder, TAKE AS DIRECTED FOR COLONIC PREP. (Patient not taking: Reported on 02/16/2020), Disp: , Rfl:  .  glipiZIDE (GLUCOTROL XL) 10 MG 24 hr tablet, Take by mouth., Disp: , Rfl:  .  glipiZIDE (GLUCOTROL XL) 5 MG 24 hr tablet, Take 5 mg by mouth daily. (Patient not taking: Reported on 02/16/2020), Disp: , Rfl:  .  Glycopyrrolate-Formoterol (BEVESPI AEROSPHERE) 9-4.8 MCG/ACT AERO, Inhale into the lungs., Disp: , Rfl:  .  HYDROcodone-acetaminophen (NORCO/VICODIN) 5-325 MG tablet, Take 1 tablet by mouth 2 (two) times daily as needed for moderate pain or severe pain., Disp: 60 tablet, Rfl: 0 .  ibuprofen (ADVIL) 600 MG tablet, Take 600 mg by mouth every 6 (six) hours as needed., Disp: , Rfl:  .  lisinopril (ZESTRIL) 10  MG tablet, Take 10 mg by mouth daily., Disp: , Rfl:  .  LORazepam (ATIVAN) 1 MG tablet, Take 1 mg by mouth 3 (three) times daily as needed. (Patient not taking: Reported on 02/16/2020), Disp: , Rfl: 0 .  naloxone (NARCAN) nasal spray 4 mg/0.1 mL, As directed for opioid induced respiratory depression, Disp: 1 each, Rfl: 2 .  naproxen (NAPROSYN) 250 MG tablet, Take 1 tablet (250 mg total) by mouth 3 (three) times daily with meals. (Patient not taking: Reported on 07/09/2019), Disp: 30 tablet, Rfl: 0 .   pioglitazone (ACTOS) 30 MG tablet, Take by mouth. (Patient not taking: Reported on 02/16/2020), Disp: , Rfl:  .  predniSONE (DELTASONE) 50 MG tablet, Take one 50 mg tablet once a day for the next five days. (Patient not taking: Reported on 07/09/2019), Disp: 5 tablet, Rfl: 0 .  sildenafil (REVATIO) 20 MG tablet, TAKE 1 - 3 TABLETS AS DIRECTED. (Patient not taking: Reported on 02/16/2020), Disp: , Rfl: 3 .  varenicline (CHANTIX) 1 MG tablet, TAKE 1 TABLET (1 MG TOTAL) BY MOUTH 2 (TWO) TIMES DAILY FOR 30 DAYS, Disp: , Rfl:   Assessment and Plan: 1. Chronic pain syndrome   2. Chronic, continuous use of opioids   3. DDD (degenerative disc disease), lumbar   4. Drug abuse ([place])   5. Facet arthritis of lumbar region   6. Post laminectomy syndrome   7. Bilateral sciatica   8. Bilateral hip pain   9. Facet arthropathy, lumbar   10. Chronic bilateral low back pain without sciatica   Based on the discussion today and upon previous opioids he has utilized for chronic pain ongoing switch him over to MS Contin.  I talked to him regarding the risks and benefits of this transition.  He will need to discontinue the Duragesic patch 12 hours to initiating MS Contin therapy.  We will start this at 60 mg 3 times a day.  He is aware of side effects and sedation associated with the transition and will contact us should he have any side effects.  Furthermore he states that he has Narcan available if necessary.  He is to use this 3 times a day and we will have a return visit in 1 month for further evaluation.  Follow Up Instructions:    I discussed the assessment and treatment plan with the patient. The patient was provided an opportunity to ask questions and all were answered. The patient agreed with the plan and demonstrated an understanding of the instructions.   The patient was advised to call back or seek an in-person evaluation if the symptoms worsen or if the condition fails to improve as anticipated.  I  provided 30 minutes of non-face-to-face time during this encounter.   Molli Barrows, MD

## 2020-08-25 ENCOUNTER — Telehealth: Payer: Self-pay

## 2020-08-25 NOTE — Telephone Encounter (Signed)
PA sent

## 2020-08-25 NOTE — Telephone Encounter (Signed)
Pt. Needs prior auth. for his medication to be filled.

## 2020-08-26 ENCOUNTER — Telehealth: Payer: Self-pay

## 2020-08-26 ENCOUNTER — Telehealth: Payer: Self-pay | Admitting: Anesthesiology

## 2020-08-26 NOTE — Telephone Encounter (Signed)
Voicemail left with patient asking if his PA went through, if it did and they were going to fill and then found they don't have the medication.  Asked to please call back re; this information.

## 2020-08-26 NOTE — Telephone Encounter (Signed)
Resent PA on short acting form with morphine 60 mg on the form.

## 2020-08-26 NOTE — Telephone Encounter (Signed)
Patient just called again, he wants someone to call him regarding medication approval from insurance.

## 2020-08-26 NOTE — Telephone Encounter (Signed)
He called and said he just off the phone with [employer] and they gave him some information for [place]. Medicaid does not pay for brand name so it has to say morphine er 60 mg.. Also check box for high dose  amount. Submit a new form because the wrong one was submitted. He wants to see if it can be done today please. If you have any questions call [phone]. The ref number from his phone call with her is  Ref # [id number]

## 2020-08-26 NOTE — Telephone Encounter (Signed)
Patient's pharmacy [facility] does not have his meds, [facility] does have his meds. Can new script be sent to [facility]? Meds are due to pick up today. Please let patient know

## 2020-08-31 ENCOUNTER — Telehealth: Payer: Self-pay

## 2020-08-31 NOTE — Telephone Encounter (Signed)
Please schedule patient a virtual appointment with Dr Andree Elk to discuss medication options.

## 2020-08-31 NOTE — Telephone Encounter (Signed)
Pt. Says that new Morphine Rx is not giving him any sort of relief. And would like to know about alternative relief.Marland Kitchen

## 2020-09-03 ENCOUNTER — Ambulatory Visit: Payer: Medicaid Other | Attending: Anesthesiology | Admitting: Anesthesiology

## 2020-09-03 ENCOUNTER — Other Ambulatory Visit: Payer: Self-pay

## 2020-09-03 ENCOUNTER — Encounter: Payer: Self-pay | Admitting: Anesthesiology

## 2020-09-03 DIAGNOSIS — F1910 Other psychoactive substance abuse, uncomplicated: Secondary | ICD-10-CM | POA: Diagnosis not present

## 2020-09-03 DIAGNOSIS — M25551 Pain in right hip: Secondary | ICD-10-CM

## 2020-09-03 DIAGNOSIS — M545 Low back pain, unspecified: Secondary | ICD-10-CM

## 2020-09-03 DIAGNOSIS — M47816 Spondylosis without myelopathy or radiculopathy, lumbar region: Secondary | ICD-10-CM

## 2020-09-03 DIAGNOSIS — F1190 Opioid use, unspecified, uncomplicated: Secondary | ICD-10-CM

## 2020-09-03 DIAGNOSIS — M5136 Other intervertebral disc degeneration, lumbar region: Secondary | ICD-10-CM

## 2020-09-03 DIAGNOSIS — M5431 Sciatica, right side: Secondary | ICD-10-CM

## 2020-09-03 DIAGNOSIS — G894 Chronic pain syndrome: Secondary | ICD-10-CM

## 2020-09-03 DIAGNOSIS — M25552 Pain in left hip: Secondary | ICD-10-CM

## 2020-09-03 DIAGNOSIS — M961 Postlaminectomy syndrome, not elsewhere classified: Secondary | ICD-10-CM

## 2020-09-03 DIAGNOSIS — M5432 Sciatica, left side: Secondary | ICD-10-CM

## 2020-09-03 DIAGNOSIS — G8929 Other chronic pain: Secondary | ICD-10-CM

## 2020-09-03 MED ORDER — FENTANYL 75 MCG/HR TD PT72: 1.0000 | MEDICATED_PATCH | TRANSDERMAL | 0 refills | Status: DC

## 2020-09-05 NOTE — Progress Notes (Signed)
Virtual Visit via Telephone Note  I connected with Jeremy Long on 09/05/20 at  1:35 PM EST by telephone and verified that I am speaking with the correct person using two identifiers.  Location: Patient: Home Provider: Pain control center   I discussed the limitations, risks, security and privacy concerns of performing an evaluation and management service by telephone and the availability of in person appointments. I also discussed with the patient that there may be a patient responsible charge related to this service. The patient expressed understanding and agreed to proceed.   History of Present Illness: I spoke with Jeremy Long via telephone today as he was unable to do the video portion of the virtual conference.  He reports that he has had some trouble with the recent change over to the MS Contin.  He feels that he is allergic and having troubles with some intermittent cough symptoms.  No shortness of breath is noted.  Otherwise he feels fine but does not feel that he is getting the degree of pain relief that he was previously getting with the Duragesic patch.  The quality of his low back pain is otherwise unchanged.  Bowel bladder function and lower extremity strength and function have been at baseline with no changes noted.  The quality characteristic and distribution of the pain in the low back remain a gnawing aching recalcitrant pain radiating into the bilateral lower extremities with sciatica-like symptoms worse with prolonged standing or activity.   Observations/Objective:  Current Outpatient Medications:  .  fentaNYL (DURAGESIC) 75 MCG/HR, Place 1 patch onto the skin every other day., Disp: 15 patch, Rfl: 0 .  ACCU-CHEK GUIDE test strip, 1 EACH (1 STRIP TOTAL) BY XX ROUTE 3 (THREE) TIMES DAILY USE AS INSTRUCTED., Disp: , Rfl:  .  Accu-Chek Softclix Lancets lancets, 1 each 3 (three) times daily., Disp: , Rfl:  .  albuterol (VENTOLIN HFA) 108 (90 Base) MCG/ACT inhaler, SMARTSIG:2 Puff(s)  By Mouth Every 6 Hours PRN, Disp: , Rfl:  .  allopurinol (ZYLOPRIM) 100 MG tablet, TAKE 1 TABLET BY MOUTH EVERY DAY, Disp: , Rfl:  .  Blood Glucose Monitoring Suppl (ACCU-CHEK GUIDE) w/Device KIT, See admin instructions., Disp: , Rfl:  .  FLUoxetine (PROZAC) 20 MG capsule, Take 20 mg by mouth every morning. (Patient not taking: Reported on 02/16/2020), Disp: , Rfl:  .  GAVILAX 17 GM/SCOOP powder, TAKE AS DIRECTED FOR COLONIC PREP. (Patient not taking: Reported on 02/16/2020), Disp: , Rfl:  .  glipiZIDE (GLUCOTROL XL) 10 MG 24 hr tablet, Take by mouth., Disp: , Rfl:  .  glipiZIDE (GLUCOTROL XL) 5 MG 24 hr tablet, Take 5 mg by mouth daily. (Patient not taking: Reported on 02/16/2020), Disp: , Rfl:  .  Glycopyrrolate-Formoterol (BEVESPI AEROSPHERE) 9-4.8 MCG/ACT AERO, Inhale into the lungs., Disp: , Rfl:  .  ibuprofen (ADVIL) 600 MG tablet, Take 600 mg by mouth every 6 (six) hours as needed., Disp: , Rfl:  .  lisinopril (ZESTRIL) 10 MG tablet, Take 10 mg by mouth daily., Disp: , Rfl:  .  LORazepam (ATIVAN) 1 MG tablet, Take 1 mg by mouth 3 (three) times daily as needed. (Patient not taking: Reported on 02/16/2020), Disp: , Rfl: 0 .  morphine (MS CONTIN) 60 MG 12 hr tablet, Take 1 tablet (60 mg total) by mouth every 8 (eight) hours. Withdraw and discard applied fentanyl patch 12 hours prior to initiating new opioid therapy., Disp: 90 tablet, Rfl: 0 .  naloxone (NARCAN) nasal spray 4 mg/0.1 mL, As  directed for opioid induced respiratory depression, Disp: 1 each, Rfl: 2 .  naproxen (NAPROSYN) 250 MG tablet, Take 1 tablet (250 mg total) by mouth 3 (three) times daily with meals. (Patient not taking: Reported on 07/09/2019), Disp: 30 tablet, Rfl: 0 .  pioglitazone (ACTOS) 30 MG tablet, Take by mouth. (Patient not taking: Reported on 02/16/2020), Disp: , Rfl:  .  predniSONE (DELTASONE) 50 MG tablet, Take one 50 mg tablet once a day for the next five days. (Patient not taking: Reported on 07/09/2019), Disp: 5 tablet,  Rfl: 0 .  sildenafil (REVATIO) 20 MG tablet, TAKE 1 - 3 TABLETS AS DIRECTED. (Patient not taking: Reported on 02/16/2020), Disp: , Rfl: 3 .  varenicline (CHANTIX) 1 MG tablet, TAKE 1 TABLET (1 MG TOTAL) BY MOUTH 2 (TWO) TIMES DAILY FOR 30 DAYS, Disp: , Rfl:   Assessment and Plan: 1. Chronic pain syndrome   2. Chronic, continuous use of opioids   3. DDD (degenerative disc disease), lumbar   4. Drug abuse ([place])   5. Facet arthritis of lumbar region   6. Post laminectomy syndrome   7. Bilateral sciatica   8. Bilateral hip pain   9. Facet arthropathy, lumbar   10. Chronic bilateral low back pain without sciatica   Based on our discussion today and after review of the [facility] practitioner database information I feel it is appropriate to refill his opioid medications but we will comply with the change back to the Duragesic patch.  This will be forwarded to the pharmacy and has been instructed to bring his MS Contin with a pill count requested at pharmacy prior to pick up of the Duragesic patch.  This will be consistent with his previous protocol which he felt was more successful.  We have talked about options regarding possible epidural steroid placement for the sciatica-like symptoms and he has had these in the past but felt that they were unsuccessful.  Unfortunately he has failed more conservative therapy.  He has been compliant with our regimen otherwise.  I want him to continue with stretching strengthening exercises as once again reviewed with continue follow-up with his primary care physicians as well.  He will be scheduled for a 1 month return to clinic.  Follow Up Instructions:    I discussed the assessment and treatment plan with the patient. The patient was provided an opportunity to ask questions and all were answered. The patient agreed with the plan and demonstrated an understanding of the instructions.   The patient was advised to call back or seek an in-person evaluation if the  symptoms worsen or if the condition fails to improve as anticipated.  I provided 30 minutes of non-face-to-face time during this encounter.   Molli Barrows, MD

## 2020-09-14 ENCOUNTER — Encounter: Payer: Self-pay | Admitting: Anesthesiology

## 2020-09-14 ENCOUNTER — Ambulatory Visit: Payer: Medicaid Other | Attending: Anesthesiology | Admitting: Anesthesiology

## 2020-09-14 ENCOUNTER — Other Ambulatory Visit: Payer: Self-pay

## 2020-09-14 VITALS — BP 134/82 | HR 84 | Temp 97.0°F | Resp 16 | Ht 66.0 in | Wt 272.0 lb

## 2020-09-14 DIAGNOSIS — M5136 Other intervertebral disc degeneration, lumbar region: Secondary | ICD-10-CM | POA: Diagnosis not present

## 2020-09-14 DIAGNOSIS — M961 Postlaminectomy syndrome, not elsewhere classified: Secondary | ICD-10-CM | POA: Diagnosis present

## 2020-09-14 DIAGNOSIS — F1190 Opioid use, unspecified, uncomplicated: Secondary | ICD-10-CM | POA: Diagnosis not present

## 2020-09-14 DIAGNOSIS — M25552 Pain in left hip: Secondary | ICD-10-CM | POA: Insufficient documentation

## 2020-09-14 DIAGNOSIS — M5431 Sciatica, right side: Secondary | ICD-10-CM | POA: Insufficient documentation

## 2020-09-14 DIAGNOSIS — M5432 Sciatica, left side: Secondary | ICD-10-CM | POA: Diagnosis present

## 2020-09-14 DIAGNOSIS — M47816 Spondylosis without myelopathy or radiculopathy, lumbar region: Secondary | ICD-10-CM | POA: Insufficient documentation

## 2020-09-14 DIAGNOSIS — F1910 Other psychoactive substance abuse, uncomplicated: Secondary | ICD-10-CM | POA: Insufficient documentation

## 2020-09-14 DIAGNOSIS — G894 Chronic pain syndrome: Secondary | ICD-10-CM | POA: Diagnosis present

## 2020-09-14 DIAGNOSIS — M25551 Pain in right hip: Secondary | ICD-10-CM | POA: Diagnosis present

## 2020-09-14 MED ORDER — FENTANYL 75 MCG/HR TD PT72
1.0000 | MEDICATED_PATCH | TRANSDERMAL | 0 refills | Status: DC
Start: 2020-09-29 — End: 2020-10-25

## 2020-09-14 MED ORDER — HYDROCODONE-ACETAMINOPHEN 7.5-325 MG PO TABS: 1.0000 | ORAL_TABLET | Freq: Four times a day (QID) | ORAL | 0 refills | Status: DC | PRN

## 2020-09-14 MED ORDER — HYDROCODONE-ACETAMINOPHEN 7.5-325 MG PO TABS: 1.0000 | ORAL_TABLET | Freq: Four times a day (QID) | ORAL | 0 refills | Status: AC | PRN

## 2020-09-14 NOTE — Progress Notes (Signed)
Nursing Pain Medication Assessment:  Safety precautions to be maintained throughout the outpatient stay will include: orient to surroundings, keep bed in low position, maintain call bell within reach at all times, provide assistance with transfer out of bed and ambulation.  Medication Inspection Compliance: Pill count conducted under aseptic conditions, in front of the patient. Neither the pills nor the bottle was removed from the patient's sight at any time. Once count was completed pills were immediately returned to the patient in their original bottle.  Medication #1: Morphine IR Pill/Patch Count: 61 of 90 pills remain Pill/Patch Appearance: Markings consistent with prescribed medication Bottle Appearance: Standard pharmacy container. Clearly labeled. Filled Date:08/27/2020 Last Medication intake:  several days ago  Wasted 61 Morphine, witnessed by patient and Terrilyn Saver, RN  Medication #2: Fentanyl patch Pill/Patch Count: 9 of 15 pills remain Pill/Patch Appearance: Markings consistent with prescribed medication Bottle Appearance: Standard pharmacy container. Clearly labeled. Filled Date: 09/03/2020 Last Medication intake:  Today

## 2020-09-17 NOTE — Progress Notes (Signed)
Subjective:  Patient ID: Jeremy Long, male    DOB: [birth year]  Age: 55 y.o. MRN: [id number]  CC: Back Pain (lower)   Procedure: None  HPI Jeremy Long presents for reevaluation.  We have been working with getting his opioid management stable for pain control.  He reports today that he is still having some breakthrough pain with his current regimen.  He was recently taking a longer acting morphine and he had difficulty with side effects of this was subsequently discontinued.  He is been moved back to the Duragesic patch on an every other day basis which has worked well for him historically.  He still feels that he has frequent breakthrough with this and desires to make a change in his management today.  Otherwise the quality characteristic and distribution of his pain remain unchanged.  He is trying to stay mobile as best possible.  He has put on some weight which he has had trouble controlling.  The medications have been without side effect and otherwise he is in his usual state of health today.  Outpatient Medications Prior to Visit  Medication Sig Dispense Refill  . ACCU-CHEK GUIDE test strip 1 EACH (1 STRIP TOTAL) BY XX ROUTE 3 (THREE) TIMES DAILY USE AS INSTRUCTED.    . Accu-Chek Softclix Lancets lancets 1 each 3 (three) times daily.    Marland Kitchen albuterol (VENTOLIN HFA) 108 (90 Base) MCG/ACT inhaler SMARTSIG:2 Puff(s) By Mouth Every 6 Hours PRN    . allopurinol (ZYLOPRIM) 100 MG tablet TAKE 1 TABLET BY MOUTH EVERY DAY    . Blood Glucose Monitoring Suppl (ACCU-CHEK GUIDE) w/Device KIT See admin instructions.    Marland Kitchen glipiZIDE (GLUCOTROL XL) 10 MG 24 hr tablet Take by mouth.    . Glycopyrrolate-Formoterol (BEVESPI AEROSPHERE) 9-4.8 MCG/ACT AERO Inhale into the lungs.    Marland Kitchen ibuprofen (ADVIL) 600 MG tablet Take 600 mg by mouth every 6 (six) hours as needed.    Marland Kitchen lisinopril (ZESTRIL) 10 MG tablet Take 10 mg by mouth daily.    . naloxone (NARCAN) nasal spray 4 mg/0.1 mL As directed for opioid  induced respiratory depression 1 each 2  . varenicline (CHANTIX) 1 MG tablet TAKE 1 TABLET (1 MG TOTAL) BY MOUTH 2 (TWO) TIMES DAILY FOR 30 DAYS    . fentaNYL (DURAGESIC) 75 MCG/HR Place 1 patch onto the skin every other day. 15 patch 0  . FLUoxetine (PROZAC) 20 MG capsule Take 20 mg by mouth every morning. (Patient not taking: No sig reported)    . GAVILAX 17 GM/SCOOP powder TAKE AS DIRECTED FOR COLONIC PREP. (Patient not taking: No sig reported)    . glipiZIDE (GLUCOTROL XL) 5 MG 24 hr tablet Take 5 mg by mouth daily. (Patient not taking: No sig reported)    . LORazepam (ATIVAN) 1 MG tablet Take 1 mg by mouth 3 (three) times daily as needed. (Patient not taking: No sig reported)  0  . metFORMIN (GLUCOPHAGE-XR) 500 MG 24 hr tablet TAKE 4 TABLETS (2,000 MG TOTAL) BY MOUTH DAILY WITH DINNER TAKE FOR DIABETES.    Marland Kitchen morphine (MS CONTIN) 60 MG 12 hr tablet Take 1 tablet (60 mg total) by mouth every 8 (eight) hours. Withdraw and discard applied fentanyl patch 12 hours prior to initiating new opioid therapy. (Patient not taking: Reported on 09/14/2020) 90 tablet 0  . naproxen (NAPROSYN) 250 MG tablet Take 1 tablet (250 mg total) by mouth 3 (three) times daily with meals. (Patient not taking: No sig reported) 30 tablet  0  . pantoprazole (PROTONIX) 40 MG tablet Take 40 mg by mouth daily.    . pioglitazone (ACTOS) 30 MG tablet Take by mouth. (Patient not taking: No sig reported)    . predniSONE (DELTASONE) 50 MG tablet Take one 50 mg tablet once a day for the next five days. (Patient not taking: No sig reported) 5 tablet 0  . sildenafil (REVATIO) 20 MG tablet TAKE 1 - 3 TABLETS AS DIRECTED. (Patient not taking: No sig reported)  3   No facility-administered medications prior to visit.    Review of Systems CNS: No confusion or sedation Cardiac: No angina or palpitations GI: No abdominal pain or constipation Constitutional: No nausea vomiting fevers or chills  Objective:  BP 134/82 (BP Location: Left  Arm, Patient Position: Sitting, Cuff Size: Large)   Pulse 84   Temp (!) 97 F (36.1 C) (Temporal)   Resp 16   Ht '5\' 6"'  (1.676 m)   Wt 272 lb (123.4 kg)   SpO2 95%   BMI 43.90 kg/m    BP Readings from Last 3 Encounters:  09/14/20 134/82  04/01/20 (!) 147/73  02/16/20 (!) 139/93     Wt Readings from Last 3 Encounters:  09/14/20 272 lb (123.4 kg)  02/16/20 285 lb (129.3 kg)  01/28/20 273 lb 4.8 oz (124 kg)     Physical Exam Pt is alert and oriented PERRL EOMI HEART IS RRR no murmur or rub LCTA no wheezing or rales MUSCULOSKELETAL reveals some paraspinous muscle tenderness.  No overt trigger points.  He goes from seated to standing with mild difficulty.  He ambulates with an antalgic gait.  His lower extremity muscle tone and bulk is at baseline.  Labs  No results found for: HGBA1C Lab Results  Component Value Date   CREATININE 0.81 01/12/2020    -------------------------------------------------------------------------------------------------------------------- Lab Results  Component Value Date   WBC 8.5 01/12/2020   HGB 15.7 01/12/2020   HCT 46.4 01/12/2020   PLT 175 01/12/2020   GLUCOSE 481 (H) 01/12/2020   NA 130 (L) 01/12/2020   K 4.6 01/12/2020   CL 94 (L) 01/12/2020   CREATININE 0.81 01/12/2020   BUN 13 01/12/2020   CO2 26 01/12/2020    --------------------------------------------------------------------------------------------------------------------- CT CHEST WO CONTRAST  Result Date: 06/25/2020 CLINICAL DATA:  Short of breath X 1 year. Current smoker. Recently broke rib from coughing. Chest tightness. EXAM: CT CHEST WITHOUT CONTRAST TECHNIQUE: Multidetector CT imaging of the chest was performed following the standard protocol without IV contrast. COMPARISON:  CT angio chest 11/22/2017, CT renal 04/01/2020, CT renal 10/17/2017 FINDINGS: Cardiovascular: Normal heart size. No significant pericardial effusion. The thoracic aorta is normal in caliber. Mild  atherosclerotic plaque of the thoracic aorta. Mild four-vessel coronary artery calcifications. Mediastinum/Nodes: No enlarged mediastinal or axillary lymph nodes. Thyroid gland, trachea, and esophagus demonstrate no significant findings. Lungs/Pleura: No focal consolidation. Several bilateral scattered pulmonary micronodules. No pulmonary mass. Diffuse bronchial wall thickening. No pleural effusion. No pneumothorax. Upper Abdomen: Calcification along the nondependent gallbladder wall lumen. No acute abnormality. Musculoskeletal: No chest wall abnormality Interval development of facet arthropathy at the T10-T11 and T11-T12 levels. Sclerosis of the anterosuperior corner of the T12 vertebral body (7:107) likely related to degenerative changes in the setting of new T11 inferior endplate sclerosis and intervertebral disc space vacuum phenomenon. No acute displaced fracture. Multilevel degenerative changes of the spine. IMPRESSION: 1. Diffuse mild bronchial wall thickening suggestive of smoking-related small airway disease. Otherwise no acute intrathoracic abnormality. 2. At least mild  four-vessel coronary artery calcifications. 3. Persistent calcification along the nondependent gallbladder wall could represent a gallstone versus gallbladder wall calcification. Consider right upper quadrant ultrasound. 4.  Aortic Atherosclerosis (ICD10-I70.0). Electronically Signed   By: Iven Finn M.D.   On: 06/25/2020 21:18     Assessment & Plan:   Jeremy Long was seen today for back pain.  Diagnoses and all orders for this visit:  Chronic pain syndrome  Chronic, continuous use of opioids  DDD (degenerative disc disease), lumbar  Drug abuse (H[facility])  Facet arthritis of lumbar region  Post laminectomy syndrome  Bilateral sciatica  Bilateral hip pain  Facet arthropathy, lumbar  Other orders -     HYDROcodone-acetaminophen (NORCO) 7.5-325 MG tablet; Take 1 tablet by mouth every 6 (six) hours as needed for up to 15  days for moderate pain or severe pain. -     fentaNYL (DURAGESIC) 75 MCG/HR; Place 1 patch onto the skin every other day. -     HYDROcodone-acetaminophen (NORCO) 7.5-325 MG tablet; Take 1 tablet by mouth every 6 (six) hours as needed for moderate pain or severe pain.        ----------------------------------------------------------------------------------------------------------------------  Problem List Items Addressed This Visit      Unprioritized   Bilateral hip pain   Chronic pain syndrome - Primary   Relevant Medications   HYDROcodone-acetaminophen (NORCO) 7.5-325 MG tablet   fentaNYL (DURAGESIC) 75 MCG/HR (Start on 09/29/2020)   HYDROcodone-acetaminophen (NORCO) 7.5-325 MG tablet (Start on 09/29/2020)   Chronic, continuous use of opioids   DDD (degenerative disc disease), lumbar   Relevant Medications   HYDROcodone-acetaminophen (NORCO) 7.5-325 MG tablet   fentaNYL (DURAGESIC) 75 MCG/HR (Start on 09/29/2020)   HYDROcodone-acetaminophen (NORCO) 7.5-325 MG tablet (Start on 09/29/2020)   Drug abuse ([place])   Facet arthropathy, lumbar   Post laminectomy syndrome   Sciatica    Other Visit Diagnoses    Facet arthritis of lumbar region       Relevant Medications   HYDROcodone-acetaminophen (NORCO) 7.5-325 MG tablet   fentaNYL (DURAGESIC) 75 MCG/HR (Start on 09/29/2020)   HYDROcodone-acetaminophen (NORCO) 7.5-325 MG tablet (Start on 09/29/2020)        ----------------------------------------------------------------------------------------------------------------------  1. Chronic pain syndrome I had a long discussion with him regarding chronic opioid management and reasonable expectations.  He seems to do well with the Duragesic patch changed out every other day.  He makes a request to go to a daily new application Duragesic patch but I have informed him that we do not feel comfortable proceeding with this.  We will keep him on the every other day Duragesic with breakthrough  hydrocodone for twice a day dosing.  I am going to move this to the 7.5 mg strength and this will be a maximum opioid management therapy for him at this time.  No other changes will be made today.  I encouraged him to continue with stretching strengthening exercises and it is imperative that he work on his weight loss.  Continue follow-up with his primary care physicians for his baseline medical care as well.  2. Chronic, continuous use of opioids As above.  I have reviewed the [facility] practitioner database information and it is appropriate.  No evidence of any diverting or illicit use is noted.  3. DDD (degenerative disc disease), lumbar Continue core strengthening and weight loss  4. Drug abuse ([place])   5. Facet arthritis of lumbar region   6. Post laminectomy syndrome   7. Bilateral sciatica   8. Bilateral hip pain  9. Facet arthropathy, lumbar     ----------------------------------------------------------------------------------------------------------------------  I am having Jeremy Long start on HYDROcodone-acetaminophen and HYDROcodone-acetaminophen. I am also having him maintain his LORazepam, sildenafil, lisinopril, naproxen, predniSONE, allopurinol, glipiZIDE, Bevespi Aerosphere, varenicline, albuterol, Accu-Chek Guide, Accu-Chek Softclix Lancets, Accu-Chek Guide, ibuprofen, FLUoxetine, pioglitazone, GaviLAX, glipiZIDE, naloxone, morphine, metFORMIN, pantoprazole, and fentaNYL.   Meds ordered this encounter  Medications  . HYDROcodone-acetaminophen (NORCO) 7.5-325 MG tablet    Sig: Take 1 tablet by mouth every 6 (six) hours as needed for up to 15 days for moderate pain or severe pain.    Dispense:  30 tablet    Refill:  0  . fentaNYL (DURAGESIC) 75 MCG/HR    Sig: Place 1 patch onto the skin every other day.    Dispense:  15 patch    Refill:  0    Pt needs to return morphine sulphate tablets number 60 for waste in order to fill this RX. The patient had  an allergic reaction to the morphine.  Dr. Andree Elk  . HYDROcodone-acetaminophen (NORCO) 7.5-325 MG tablet    Sig: Take 1 tablet by mouth every 6 (six) hours as needed for moderate pain or severe pain.    Dispense:  60 tablet    Refill:  0   Patient's Medications  New Prescriptions   HYDROCODONE-ACETAMINOPHEN (NORCO) 7.5-325 MG TABLET    Take 1 tablet by mouth every 6 (six) hours as needed for up to 15 days for moderate pain or severe pain.   HYDROCODONE-ACETAMINOPHEN (NORCO) 7.5-325 MG TABLET    Take 1 tablet by mouth every 6 (six) hours as needed for moderate pain or severe pain.  Previous Medications   ACCU-CHEK GUIDE TEST STRIP    1 EACH (1 STRIP TOTAL) BY XX ROUTE 3 (THREE) TIMES DAILY USE AS INSTRUCTED.   ACCU-CHEK SOFTCLIX LANCETS LANCETS    1 each 3 (three) times daily.   ALBUTEROL (VENTOLIN HFA) 108 (90 BASE) MCG/ACT INHALER    SMARTSIG:2 Puff(s) By Mouth Every 6 Hours PRN   ALLOPURINOL (ZYLOPRIM) 100 MG TABLET    TAKE 1 TABLET BY MOUTH EVERY DAY   BLOOD GLUCOSE MONITORING SUPPL (ACCU-CHEK GUIDE) W/DEVICE KIT    See admin instructions.   FLUOXETINE (PROZAC) 20 MG CAPSULE    Take 20 mg by mouth every morning.   GAVILAX 17 GM/SCOOP POWDER    TAKE AS DIRECTED FOR COLONIC PREP.   GLIPIZIDE (GLUCOTROL XL) 10 MG 24 HR TABLET    Take by mouth.   GLIPIZIDE (GLUCOTROL XL) 5 MG 24 HR TABLET    Take 5 mg by mouth daily.   GLYCOPYRROLATE-FORMOTEROL (BEVESPI AEROSPHERE) 9-4.8 MCG/ACT AERO    Inhale into the lungs.   IBUPROFEN (ADVIL) 600 MG TABLET    Take 600 mg by mouth every 6 (six) hours as needed.   LISINOPRIL (ZESTRIL) 10 MG TABLET    Take 10 mg by mouth daily.   LORAZEPAM (ATIVAN) 1 MG TABLET    Take 1 mg by mouth 3 (three) times daily as needed.   METFORMIN (GLUCOPHAGE-XR) 500 MG 24 HR TABLET    TAKE 4 TABLETS (2,000 MG TOTAL) BY MOUTH DAILY WITH DINNER TAKE FOR DIABETES.   MORPHINE (MS CONTIN) 60 MG 12 HR TABLET    Take 1 tablet (60 mg total) by mouth every 8 (eight) hours. Withdraw and  discard applied fentanyl patch 12 hours prior to initiating new opioid therapy.   NALOXONE (NARCAN) NASAL SPRAY 4 MG/0.1 ML    As directed for opioid  induced respiratory depression   NAPROXEN (NAPROSYN) 250 MG TABLET    Take 1 tablet (250 mg total) by mouth 3 (three) times daily with meals.   PANTOPRAZOLE (PROTONIX) 40 MG TABLET    Take 40 mg by mouth daily.   PIOGLITAZONE (ACTOS) 30 MG TABLET    Take by mouth.   PREDNISONE (DELTASONE) 50 MG TABLET    Take one 50 mg tablet once a day for the next five days.   SILDENAFIL (REVATIO) 20 MG TABLET    TAKE 1 - 3 TABLETS AS DIRECTED.   VARENICLINE (CHANTIX) 1 MG TABLET    TAKE 1 TABLET (1 MG TOTAL) BY MOUTH 2 (TWO) TIMES DAILY FOR 30 DAYS  Modified Medications   Modified Medication Previous Medication   FENTANYL (DURAGESIC) 75 MCG/HR fentaNYL (DURAGESIC) 75 MCG/HR      Place 1 patch onto the skin every other day.    Place 1 patch onto the skin every other day.  Discontinued Medications   No medications on file   ----------------------------------------------------------------------------------------------------------------------  Follow-up: Return in about 6 weeks (around 10/26/2020) for evaluation, med refill.    Molli Barrows, MD

## 2020-09-25 LAB — TOXASSURE SELECT 13 (MW), URINE

## 2020-10-04 ENCOUNTER — Other Ambulatory Visit
Admission: RE | Admit: 2020-10-04 | Discharge: 2020-10-04 | Disposition: A | Discharge status: Home / Self Care | Payer: Medicaid Other | Source: Ambulatory Visit | Attending: Specialist | Admitting: Specialist

## 2020-10-04 ENCOUNTER — Other Ambulatory Visit: Payer: Self-pay | Admitting: Specialist

## 2020-10-04 ENCOUNTER — Ambulatory Visit
Admission: RE | Admit: 2020-10-04 | Discharge: 2020-10-04 | Disposition: A | Discharge status: Home / Self Care | Payer: Medicaid Other | Source: Ambulatory Visit | Attending: Specialist | Admitting: Specialist

## 2020-10-04 ENCOUNTER — Other Ambulatory Visit: Payer: Self-pay

## 2020-10-04 DIAGNOSIS — R079 Chest pain, unspecified: Secondary | ICD-10-CM | POA: Insufficient documentation

## 2020-10-04 DIAGNOSIS — R7989 Other specified abnormal findings of blood chemistry: Secondary | ICD-10-CM | POA: Diagnosis not present

## 2020-10-04 DIAGNOSIS — R0602 Shortness of breath: Secondary | ICD-10-CM | POA: Diagnosis present

## 2020-10-04 LAB — D-DIMER, QUANTITATIVE: D-Dimer, Quant: 0.68 ug/mL-FEU — ABNORMAL HIGH (ref 0.00–0.50)

## 2020-10-04 LAB — POCT I-STAT CREATININE: Creatinine, Ser: 0.8 mg/dL (ref 0.61–1.24)

## 2020-10-04 MED ORDER — IOHEXOL 350 MG/ML SOLN
75.0000 mL | Freq: Once | INTRAVENOUS | Status: AC | PRN
  Administered 2020-10-04: 75 mL via INTRAVENOUS

## 2020-10-25 ENCOUNTER — Encounter: Payer: Self-pay | Admitting: Anesthesiology

## 2020-10-25 ENCOUNTER — Ambulatory Visit: Payer: Medicaid Other | Attending: Anesthesiology | Admitting: Anesthesiology

## 2020-10-25 ENCOUNTER — Other Ambulatory Visit: Payer: Self-pay

## 2020-10-25 DIAGNOSIS — F1910 Other psychoactive substance abuse, uncomplicated: Secondary | ICD-10-CM | POA: Diagnosis not present

## 2020-10-25 DIAGNOSIS — M5431 Sciatica, right side: Secondary | ICD-10-CM

## 2020-10-25 DIAGNOSIS — M5136 Other intervertebral disc degeneration, lumbar region: Secondary | ICD-10-CM | POA: Diagnosis not present

## 2020-10-25 DIAGNOSIS — G894 Chronic pain syndrome: Secondary | ICD-10-CM | POA: Diagnosis not present

## 2020-10-25 DIAGNOSIS — M47816 Spondylosis without myelopathy or radiculopathy, lumbar region: Secondary | ICD-10-CM

## 2020-10-25 DIAGNOSIS — F1190 Opioid use, unspecified, uncomplicated: Secondary | ICD-10-CM

## 2020-10-25 DIAGNOSIS — M961 Postlaminectomy syndrome, not elsewhere classified: Secondary | ICD-10-CM

## 2020-10-25 DIAGNOSIS — M25551 Pain in right hip: Secondary | ICD-10-CM

## 2020-10-25 DIAGNOSIS — M5432 Sciatica, left side: Secondary | ICD-10-CM

## 2020-10-25 DIAGNOSIS — M25552 Pain in left hip: Secondary | ICD-10-CM

## 2020-10-27 MED ORDER — HYDROCODONE-ACETAMINOPHEN 7.5-325 MG PO TABS: 1.0000 | ORAL_TABLET | Freq: Two times a day (BID) | ORAL | 0 refills | Status: AC | PRN

## 2020-10-27 MED ORDER — FENTANYL 75 MCG/HR TD PT72: 1.0000 | MEDICATED_PATCH | TRANSDERMAL | 0 refills | Status: AC

## 2020-10-27 NOTE — Progress Notes (Signed)
Virtual Visit via Telephone Note  I connected with Jeremy Long on 10/27/20 at  2:00 PM EDT by telephone and verified that I am speaking with the correct person using two identifiers.  Location: Patient: Home Provider: Pain control center   I discussed the limitations, risks, security and privacy concerns of performing an evaluation and management service by telephone and the availability of in person appointments. I also discussed with the patient that there may be a patient responsible charge related to this service. The patient expressed understanding and agreed to proceed.   History of Present Illness: I spoke with Jeremy Long via telephone as he was unable to do the video portion of virtual conference and he reports that he is doing well with his current regimen.  He is no longer having some of the Long effects that he was having with the MS Contin previously.  He is tolerating the Duragesic well.  He generally reports that he has good pain control on day 1 and generally breaks through on day 2.  He is using his hydrocodone for breakthrough pain and this is working well with no excess sedation or any Long effects reported.  We have gone over these in detail.  He understands the risks and benefits of the protocol as reviewed today.  Furthermore he is doing his stretching strengthening exercises with limited success.  No change in the quality characteristic or distribution of his pain is noted.  He is having some more hip pain recently.  He is scheduled to see an orthopedist for this.  Otherwise he is stable with no change in lower extremity strength or function.   Observations/Objective:  Current Outpatient Medications:  .  ACCU-CHEK GUIDE test strip, 1 EACH (1 STRIP TOTAL) BY XX ROUTE 3 (THREE) TIMES DAILY USE AS INSTRUCTED., Disp: , Rfl:  .  Accu-Chek Softclix Lancets lancets, 1 each 3 (three) times daily., Disp: , Rfl:  .  albuterol (VENTOLIN HFA) 108 (90 Base) MCG/ACT inhaler, SMARTSIG:2  Puff(s) By Mouth Every 6 Hours PRN, Disp: , Rfl:  .  allopurinol (ZYLOPRIM) 100 MG tablet, TAKE 1 TABLET BY MOUTH EVERY DAY, Disp: , Rfl:  .  Blood Glucose Monitoring Suppl (ACCU-CHEK GUIDE) w/Device KIT, See admin instructions., Disp: , Rfl:  .  fentaNYL (DURAGESIC) 75 MCG/HR, Place 1 patch onto the skin every other day., Disp: 15 patch, Rfl: 0 .  FLUoxetine (PROZAC) 20 MG capsule, Take 20 mg by mouth every morning. (Patient not taking: No sig reported), Disp: , Rfl:  .  GAVILAX 17 GM/SCOOP powder, TAKE AS DIRECTED FOR COLONIC PREP. (Patient not taking: No sig reported), Disp: , Rfl:  .  glipiZIDE (GLUCOTROL XL) 10 MG 24 hr tablet, Take by mouth., Disp: , Rfl:  .  glipiZIDE (GLUCOTROL XL) 5 MG 24 hr tablet, Take 5 mg by mouth daily. (Patient not taking: No sig reported), Disp: , Rfl:  .  Glycopyrrolate-Formoterol (BEVESPI AEROSPHERE) 9-4.8 MCG/ACT AERO, Inhale into the lungs., Disp: , Rfl:  .  HYDROcodone-acetaminophen (NORCO) 7.5-325 MG tablet, Take 1 tablet by mouth every 6 (six) hours as needed for moderate pain or severe pain., Disp: 60 tablet, Rfl: 0 .  ibuprofen (ADVIL) 600 MG tablet, Take 600 mg by mouth every 6 (six) hours as needed., Disp: , Rfl:  .  lisinopril (ZESTRIL) 10 MG tablet, Take 10 mg by mouth daily., Disp: , Rfl:  .  LORazepam (ATIVAN) 1 MG tablet, Take 1 mg by mouth 3 (three) times daily as needed. (Patient not  taking: No sig reported), Disp: , Rfl: 0 .  metFORMIN (GLUCOPHAGE-XR) 500 MG 24 hr tablet, TAKE 4 TABLETS (2,000 MG TOTAL) BY MOUTH DAILY WITH DINNER TAKE FOR DIABETES., Disp: , Rfl:  .  morphine (MS CONTIN) 60 MG 12 hr tablet, Take 1 tablet (60 mg total) by mouth every 8 (eight) hours. Withdraw and discard applied fentanyl patch 12 hours prior to initiating new opioid therapy. (Patient not taking: Reported on 09/14/2020), Disp: 90 tablet, Rfl: 0 .  naloxone (NARCAN) nasal spray 4 mg/0.1 mL, As directed for opioid induced respiratory depression, Disp: 1 each, Rfl: 2 .   naproxen (NAPROSYN) 250 MG tablet, Take 1 tablet (250 mg total) by mouth 3 (three) times daily with meals. (Patient not taking: No sig reported), Disp: 30 tablet, Rfl: 0 .  pantoprazole (PROTONIX) 40 MG tablet, Take 40 mg by mouth daily., Disp: , Rfl:  .  pioglitazone (ACTOS) 30 MG tablet, Take by mouth. (Patient not taking: No sig reported), Disp: , Rfl:  .  predniSONE (DELTASONE) 50 MG tablet, Take one 50 mg tablet once a day for the next five days. (Patient not taking: No sig reported), Disp: 5 tablet, Rfl: 0 .  sildenafil (REVATIO) 20 MG tablet, TAKE 1 - 3 TABLETS AS DIRECTED. (Patient not taking: No sig reported), Disp: , Rfl: 3 .  varenicline (CHANTIX) 1 MG tablet, TAKE 1 TABLET (1 MG TOTAL) BY MOUTH 2 (TWO) TIMES DAILY FOR 30 DAYS, Disp: , Rfl:   Assessment and Plan: 1. Chronic pain syndrome   2. Chronic, continuous use of opioids   3. DDD (degenerative disc disease), lumbar   4. Drug abuse ([place])   5. Facet arthritis of lumbar region   6. Post laminectomy syndrome   7. Bilateral sciatica   8. Bilateral hip pain   9. Facet arthropathy, lumbar   Based on our discussion today upon review of the [facility] practitioner database information it is appropriate to refill his medications.  He has been doing well with this protocol and no changes are made otherwise.  The refills will be dated for April 17 and May 17.  We will schedule him for return to clinic in 2 months.  I encouraged him to continue follow-up with his primary care physicians for his baseline medical care.  I want him to also follow-up with orthopedic regarding his hip pain.  Follow Up Instructions:    I discussed the assessment and treatment plan with the patient. The patient was provided an opportunity to ask questions and all were answered. The patient agreed with the plan and demonstrated an understanding of the instructions.   The patient was advised to call back or seek an in-person evaluation if the symptoms worsen  or if the condition fails to improve as anticipated.  I provided 30 minutes of non-face-to-face time during this encounter.   Molli Barrows, MD

## 2020-11-02 ENCOUNTER — Telehealth: Payer: Self-pay

## 2020-11-02 NOTE — Telephone Encounter (Signed)
PA needed for pharmacy to fill his prescription pt sates he have been trying to get his medication since Sunday 10/31/2020

## 2020-11-03 ENCOUNTER — Telehealth: Payer: Self-pay | Admitting: Anesthesiology

## 2020-11-03 NOTE — Telephone Encounter (Signed)
Patient called very irate because he had to buy his meds. Wanted to know why we couldn't get authorization before they were due to fill on last Sunday. I explained we cannot get PA until patient request refill from pharmacy. He says he will discuss this with Dr. Andree Elk.

## 2020-11-17 ENCOUNTER — Telehealth: Payer: Self-pay

## 2020-11-17 NOTE — Telephone Encounter (Signed)
Unable to get anywhere with the number and ref# given.  Have resent the PA on a long acting form for fentanyl patches with current records.

## 2020-11-17 NOTE — Telephone Encounter (Signed)
He wants to speak to a nurse who gets prior auth for his medicine. He spoke to [employer] and has some information for you. Please call him.

## 2020-11-17 NOTE — Telephone Encounter (Signed)
Called patient and he gave me some information about his PA for fentanyl patches.    Medicaid.. [phone]  Ref#  [id number]  I will check on this and call him back.

## 2020-11-18 ENCOUNTER — Telehealth: Payer: Self-pay | Admitting: Anesthesiology

## 2020-11-18 NOTE — Telephone Encounter (Signed)
Called patient with latest documented update. No answer. LVM with INFO.

## 2020-11-22 ENCOUNTER — Telehealth: Payer: Self-pay | Admitting: Anesthesiology

## 2020-12-29 ENCOUNTER — Encounter: Payer: Self-pay | Admitting: Anesthesiology

## 2020-12-29 ENCOUNTER — Ambulatory Visit: Payer: Medicaid Other | Attending: Anesthesiology | Admitting: Anesthesiology

## 2020-12-29 ENCOUNTER — Other Ambulatory Visit: Payer: Self-pay

## 2020-12-29 DIAGNOSIS — G894 Chronic pain syndrome: Secondary | ICD-10-CM

## 2020-12-29 DIAGNOSIS — M5136 Other intervertebral disc degeneration, lumbar region: Secondary | ICD-10-CM

## 2020-12-29 DIAGNOSIS — J439 Emphysema, unspecified: Secondary | ICD-10-CM

## 2020-12-29 DIAGNOSIS — M5432 Sciatica, left side: Secondary | ICD-10-CM

## 2020-12-29 DIAGNOSIS — F1190 Opioid use, unspecified, uncomplicated: Secondary | ICD-10-CM | POA: Diagnosis not present

## 2020-12-29 DIAGNOSIS — F1910 Other psychoactive substance abuse, uncomplicated: Secondary | ICD-10-CM | POA: Diagnosis not present

## 2020-12-29 DIAGNOSIS — M961 Postlaminectomy syndrome, not elsewhere classified: Secondary | ICD-10-CM

## 2020-12-29 DIAGNOSIS — M47816 Spondylosis without myelopathy or radiculopathy, lumbar region: Secondary | ICD-10-CM

## 2020-12-29 DIAGNOSIS — M5431 Sciatica, right side: Secondary | ICD-10-CM

## 2020-12-29 MED ORDER — OXYCODONE HCL 15 MG PO TABS: 15.0000 mg | ORAL_TABLET | Freq: Four times a day (QID) | ORAL | 0 refills | Status: DC | PRN

## 2021-01-03 NOTE — Progress Notes (Addendum)
Virtual Visit via Telephone Note  I connected with Jeremy Long on 01/03/21 at  9:50 AM EDT by telephone and verified that I am speaking with the correct person using two identifiers.  Location: Patient: Home Provider: Pain control center   I discussed the limitations, risks, security and privacy concerns of performing an evaluation and management service by telephone and the availability of in person appointments. I also discussed with the patient that there may be a patient responsible charge related to this service. The patient expressed understanding and agreed to proceed.   History of Present Illness: I was able to catch up with Jeremy Long via telephone but we were unable to link for the video portion of virtual conference.  He reports that he is doing reasonably well but continues to have frequent breakthrough pain.  The quality characteristic distribution of the low back pain and leg pain have been stable in nature with no significant changes recently.  He does have frequent breakthrough pain with the Duragesic patch still approximately day 2 and receives very little benefit on day 3.  He takes his short acting opioids and they do help but the relief is considered insufficient compared to what he has been on in the past.  No change in the quality or characteristic of the pain is noted but he is getting more sciatica-like symptoms with radiation into the hip and buttocks which has been more severe recently.  No change in bowel or bladder function is noted either.  Review of systems: General no fevers chills nausea GI: No constipation or diarrhea Pulmonary: No shortness of breath or dyspnea Cardiac: No angina palpitations Psych: Stable   Observations/Objective:  Current Outpatient Medications:    oxyCODONE (ROXICODONE) 15 MG immediate release tablet, Take 1 tablet (15 mg total) by mouth every 6 (six) hours as needed for pain. Discontinue patch prior to starting oxycodone 28m  therapy, Disp: 120 tablet, Rfl: 0   ACCU-CHEK GUIDE test strip, 1 EACH (1 STRIP TOTAL) BY XX ROUTE 3 (THREE) TIMES DAILY USE AS INSTRUCTED., Disp: , Rfl:    Accu-Chek Softclix Lancets lancets, 1 each 3 (three) times daily., Disp: , Rfl:    albuterol (VENTOLIN HFA) 108 (90 Base) MCG/ACT inhaler, SMARTSIG:2 Puff(s) By Mouth Every 6 Hours PRN, Disp: , Rfl:    allopurinol (ZYLOPRIM) 100 MG tablet, TAKE 1 TABLET BY MOUTH EVERY DAY, Disp: , Rfl:    Blood Glucose Monitoring Suppl (ACCU-CHEK GUIDE) w/Device KIT, See admin instructions., Disp: , Rfl:    FLUoxetine (PROZAC) 20 MG capsule, Take 20 mg by mouth every morning. (Patient not taking: No sig reported), Disp: , Rfl:    GAVILAX 17 GM/SCOOP powder, TAKE AS DIRECTED FOR COLONIC PREP. (Patient not taking: No sig reported), Disp: , Rfl:    glipiZIDE (GLUCOTROL XL) 10 MG 24 hr tablet, Take by mouth., Disp: , Rfl:    glipiZIDE (GLUCOTROL XL) 5 MG 24 hr tablet, Take 5 mg by mouth daily. (Patient not taking: No sig reported), Disp: , Rfl:    Glycopyrrolate-Formoterol (BEVESPI AEROSPHERE) 9-4.8 MCG/ACT AERO, Inhale into the lungs., Disp: , Rfl:    ibuprofen (ADVIL) 600 MG tablet, Take 600 mg by mouth every 6 (six) hours as needed., Disp: , Rfl:    lisinopril (ZESTRIL) 10 MG tablet, Take 10 mg by mouth daily., Disp: , Rfl:    LORazepam (ATIVAN) 1 MG tablet, Take 1 mg by mouth 3 (three) times daily as needed. (Patient not taking: No sig reported), Disp: , Rfl:  0   metFORMIN (GLUCOPHAGE-XR) 500 MG 24 hr tablet, TAKE 4 TABLETS (2,000 MG TOTAL) BY MOUTH DAILY WITH DINNER TAKE FOR DIABETES., Disp: , Rfl:    morphine (MS CONTIN) 60 MG 12 hr tablet, Take 1 tablet (60 mg total) by mouth every 8 (eight) hours. Withdraw and discard applied fentanyl patch 12 hours prior to initiating new opioid therapy. (Patient not taking: Reported on 09/14/2020), Disp: 90 tablet, Rfl: 0   naloxone (NARCAN) nasal spray 4 mg/0.1 mL, As directed for opioid induced respiratory depression, Disp:  1 each, Rfl: 2   naproxen (NAPROSYN) 250 MG tablet, Take 1 tablet (250 mg total) by mouth 3 (three) times daily with meals. (Patient not taking: No sig reported), Disp: 30 tablet, Rfl: 0   pantoprazole (PROTONIX) 40 MG tablet, Take 40 mg by mouth daily., Disp: , Rfl:    pioglitazone (ACTOS) 30 MG tablet, Take by mouth. (Patient not taking: No sig reported), Disp: , Rfl:    predniSONE (DELTASONE) 50 MG tablet, Take one 50 mg tablet once a day for the next five days. (Patient not taking: No sig reported), Disp: 5 tablet, Rfl: 0   sildenafil (REVATIO) 20 MG tablet, TAKE 1 - 3 TABLETS AS DIRECTED. (Patient not taking: No sig reported), Disp: , Rfl: 3   varenicline (CHANTIX) 1 MG tablet, TAKE 1 TABLET (1 MG TOTAL) BY MOUTH 2 (TWO) TIMES DAILY FOR 30 DAYS, Disp: , Rfl:    Assessment and Plan: 1. Chronic pain syndrome   2. Chronic, continuous use of opioids   3. DDD (degenerative disc disease), lumbar   4. Drug abuse ([place])   5. Facet arthritis of lumbar region   6. Post laminectomy syndrome   7. Bilateral sciatica   8. Pulmonary emphysema, unspecified emphysema type ([place])   Based on her discussion today I am going to make some changes in his pain management protocol.  We will call in a change in prescription for an oxycodone 15 mg tablet to be taken 4 times a day.  In the past he reports that he was on 30 mg 4 times a day and this was more effective.  We will start with a 15 mg dose and I want him to continue the Duragesic prior to initiation of this.  He is not to take any other opioids other than the 50 mg tablet 4 times a day.  We have gone over this extensively.  He understands the risks and benefits of chronic opioid management.  I have reviewed the [facility] practitioner database and it is appropriate for refill.  I want him to continue stretching strengthening exercises.  We did talk about consideration for a caudal epidural but he has had complaints of a previous epidural causing some  neuralgia following that procedure with a prior pain management physician.  Wanted to continue follow-up with his primary care physicians for his baseline medical care with return to clinic in 1 month.  Follow Up Instructions:    I discussed the assessment and treatment plan with the patient. The patient was provided an opportunity to ask questions and all were answered. The patient agreed with the plan and demonstrated an understanding of the instructions.   The patient was advised to call back or seek an in-person evaluation if the symptoms worsen or if the condition fails to improve as anticipated.  I provided 30 minutes of non-face-to-face time during this encounter.   Molli Barrows, MD

## 2021-01-25 ENCOUNTER — Ambulatory Visit: Payer: Medicaid Other | Attending: Anesthesiology | Admitting: Anesthesiology

## 2021-01-25 ENCOUNTER — Encounter: Payer: Self-pay | Admitting: Anesthesiology

## 2021-01-25 DIAGNOSIS — F1910 Other psychoactive substance abuse, uncomplicated: Secondary | ICD-10-CM

## 2021-01-25 DIAGNOSIS — M5431 Sciatica, right side: Secondary | ICD-10-CM

## 2021-01-25 DIAGNOSIS — F1190 Opioid use, unspecified, uncomplicated: Secondary | ICD-10-CM

## 2021-01-25 DIAGNOSIS — G894 Chronic pain syndrome: Secondary | ICD-10-CM | POA: Diagnosis not present

## 2021-01-25 DIAGNOSIS — M5136 Other intervertebral disc degeneration, lumbar region: Secondary | ICD-10-CM | POA: Diagnosis not present

## 2021-01-25 DIAGNOSIS — M25551 Pain in right hip: Secondary | ICD-10-CM

## 2021-01-25 DIAGNOSIS — M47816 Spondylosis without myelopathy or radiculopathy, lumbar region: Secondary | ICD-10-CM

## 2021-01-25 DIAGNOSIS — M25552 Pain in left hip: Secondary | ICD-10-CM

## 2021-01-25 DIAGNOSIS — M961 Postlaminectomy syndrome, not elsewhere classified: Secondary | ICD-10-CM

## 2021-01-25 DIAGNOSIS — M5432 Sciatica, left side: Secondary | ICD-10-CM

## 2021-01-25 MED ORDER — OXYCODONE HCL 15 MG PO TABS: 15.0000 mg | ORAL_TABLET | ORAL | 0 refills | Status: AC | PRN

## 2021-01-25 MED ORDER — OXYCODONE HCL 15 MG PO TABS: 15.0000 mg | ORAL_TABLET | ORAL | 0 refills | Status: DC | PRN

## 2021-01-25 NOTE — Progress Notes (Signed)
Virtual Visit via Telephone Note  I connected with Jeremy Long on 01/25/21 at  1:45 PM EDT by telephone and verified that I am speaking with the correct person using two identifiers.  Location: Patient: Home Provider: Pain control center   I discussed the limitations, risks, security and privacy concerns of performing an evaluation and management service by telephone and the availability of in person appointments. I also discussed with the patient that there may be a patient responsible charge related to this service. The patient expressed understanding and agreed to proceed.   History of Present Illness: I spoke with Jeremy Long today via telephone as we were unable to link for the video portion of the virtual conference.  He maintains that he has been in a considerable amount of right lower back pain with radiation into the right groin.  The back pain has been a common complaint but the right groin pain is more recent.  He was seen in evaluation for this and had an injection for the right groin.  He said that this eliminated his pain for about 12 to 18 hours but he has had recurrence of that same pain now.  The pain in the low back and hips and buttocks is persistent and unchanged.  He takes his opioid medications and presently is taking oxycodone 15 mg 4 times a day and this works well but he breaks through after about 3 to 4 hours.  No other more conservative therapy has been effective for him.  He has been through interventional therapy before with limited benefit.  He has been seen by neurosurgery and found to be a nonsurgical candidate.  Otherwise he is tolerating the medications with good relief but short-lived and denies side effects from the medicines.  Review of systems: General: No fevers or chills Pulmonary: No shortness of breath or dyspnea Cardiac: No angina or palpitations or lightheadedness GI: No abdominal pain or constipation Psych: No depression     Observations/Objective:  Current Outpatient Medications:    [START ON 02/24/2021] oxyCODONE (ROXICODONE) 15 MG immediate release tablet, Take 1 tablet (15 mg total) by mouth every 4 (four) hours as needed for pain., Disp: 150 tablet, Rfl: 0   ACCU-CHEK GUIDE test strip, 1 EACH (1 STRIP TOTAL) BY XX ROUTE 3 (THREE) TIMES DAILY USE AS INSTRUCTED., Disp: , Rfl:    Accu-Chek Softclix Lancets lancets, 1 each 3 (three) times daily., Disp: , Rfl:    albuterol (VENTOLIN HFA) 108 (90 Base) MCG/ACT inhaler, SMARTSIG:2 Puff(s) By Mouth Every 6 Hours PRN, Disp: , Rfl:    allopurinol (ZYLOPRIM) 100 MG tablet, TAKE 1 TABLET BY MOUTH EVERY DAY, Disp: , Rfl:    Blood Glucose Monitoring Suppl (ACCU-CHEK GUIDE) w/Device KIT, See admin instructions., Disp: , Rfl:    FLUoxetine (PROZAC) 20 MG capsule, Take 20 mg by mouth every morning. (Patient not taking: No sig reported), Disp: , Rfl:    GAVILAX 17 GM/SCOOP powder, TAKE AS DIRECTED FOR COLONIC PREP. (Patient not taking: No sig reported), Disp: , Rfl:    glipiZIDE (GLUCOTROL XL) 10 MG 24 hr tablet, Take by mouth., Disp: , Rfl:    glipiZIDE (GLUCOTROL XL) 5 MG 24 hr tablet, Take 5 mg by mouth daily. (Patient not taking: No sig reported), Disp: , Rfl:    Glycopyrrolate-Formoterol (BEVESPI AEROSPHERE) 9-4.8 MCG/ACT AERO, Inhale into the lungs., Disp: , Rfl:    ibuprofen (ADVIL) 600 MG tablet, Take 600 mg by mouth every 6 (six) hours as needed., Disp: , Rfl:  lisinopril (ZESTRIL) 10 MG tablet, Take 10 mg by mouth daily., Disp: , Rfl:    LORazepam (ATIVAN) 1 MG tablet, Take 1 mg by mouth 3 (three) times daily as needed. (Patient not taking: No sig reported), Disp: , Rfl: 0   metFORMIN (GLUCOPHAGE-XR) 500 MG 24 hr tablet, TAKE 4 TABLETS (2,000 MG TOTAL) BY MOUTH DAILY WITH DINNER TAKE FOR DIABETES., Disp: , Rfl:    morphine (MS CONTIN) 60 MG 12 hr tablet, Take 1 tablet (60 mg total) by mouth every 8 (eight) hours. Withdraw and discard applied fentanyl patch 12 hours  prior to initiating new opioid therapy. (Patient not taking: Reported on 09/14/2020), Disp: 90 tablet, Rfl: 0   naloxone (NARCAN) nasal spray 4 mg/0.1 mL, As directed for opioid induced respiratory depression, Disp: 1 each, Rfl: 2   naproxen (NAPROSYN) 250 MG tablet, Take 1 tablet (250 mg total) by mouth 3 (three) times daily with meals. (Patient not taking: No sig reported), Disp: 30 tablet, Rfl: 0   oxyCODONE (ROXICODONE) 15 MG immediate release tablet, Take 1 tablet (15 mg total) by mouth every 4 (four) hours as needed for pain. Discontinue patch prior to starting oxycodone 15mg  therapy, Disp: 150 tablet, Rfl: 0   pantoprazole (PROTONIX) 40 MG tablet, Take 40 mg by mouth daily., Disp: , Rfl:    pioglitazone (ACTOS) 30 MG tablet, Take by mouth. (Patient not taking: No sig reported), Disp: , Rfl:    predniSONE (DELTASONE) 50 MG tablet, Take one 50 mg tablet once a day for the next five days. (Patient not taking: No sig reported), Disp: 5 tablet, Rfl: 0   sildenafil (REVATIO) 20 MG tablet, TAKE 1 - 3 TABLETS AS DIRECTED. (Patient not taking: No sig reported), Disp: , Rfl: 3   varenicline (CHANTIX) 1 MG tablet, TAKE 1 TABLET (1 MG TOTAL) BY MOUTH 2 (TWO) TIMES DAILY FOR 30 DAYS, Disp: , Rfl:    Assessment and Plan: 1. Chronic pain syndrome   2. Chronic, continuous use of opioids   3. DDD (degenerative disc disease), lumbar   4. Drug abuse ([place])   5. Facet arthritis of lumbar region   6. Post laminectomy syndrome   7. Bilateral sciatica   8. Bilateral hip pain   9. Facet arthropathy, lumbar   Based on her discussion today and upon review of the [facility] practitioner database information going to keep him on the oxycodone 15 mg tablets as he is doing better with these then he was on the Duragesic previously.  The combination of Duragesic and short acting opioid was ineffective he reports.  He does get good relief with the 15 mg tablets of breakthrough during portions of the day so I am  increasing him to a every 4 hour range or 5 tablets maximum per day.  He is aware of the potential side effects and risk profile of chronic opioid therapy however he has failed more conservative therapy.  I am furthermore, referring him to [facility] to see if there are other interventional therapy modalities that he may be a candidate for.  At this point I do not feel that we have any other options for him as he has refused dorsal column stimulator and I believe he would be a poor candidate for a implantable pump.  We are comfortable continuing to write for his chronic opioid therapy but I do believe it would be beneficial to get a second opinion and he is willing to try this as well.  He  is to continue follow-up with his primary care physicians for his baseline medical care with a schedule return to clinic in 2 months  Follow Up Instructions:    I discussed the assessment and treatment plan with the patient. The patient was provided an opportunity to ask questions and all were answered. The patient agreed with the plan and demonstrated an understanding of the instructions.   The patient was advised to call back or seek an in-person evaluation if the symptoms worsen or if the condition fails to improve as anticipated.  I provided 30 minutes of non-face-to-face time during this encounter.   Molli Barrows, MD

## 2021-01-26 ENCOUNTER — Telehealth: Payer: Self-pay | Admitting: Anesthesiology

## 2021-01-26 ENCOUNTER — Telehealth: Payer: Self-pay

## 2021-01-26 NOTE — Telephone Encounter (Signed)
[facility] called [facility] they do not have his pain med. Can Dr. Andree Elk resend script to [facility].

## 2021-01-26 NOTE — Telephone Encounter (Signed)
Patient called and states that his pharmacy does not have his oxycodone.  He requests it be sent to [facility] in [place].  He states it also needs to say that he can pay cash.  I changed the pharmacy in the chart.

## 2021-01-26 NOTE — Telephone Encounter (Signed)
Patient called asking that we resend the script to [facility] in [place] and make a note that he can pay cash for it.  Message sent to Dr Andree Elk.

## 2021-01-29 ENCOUNTER — Emergency Department: Payer: Medicaid Other

## 2021-01-29 ENCOUNTER — Other Ambulatory Visit: Payer: Self-pay

## 2021-01-29 ENCOUNTER — Emergency Department
Admission: EM | Admit: 2021-01-29 | Discharge: 2021-01-29 | Disposition: A | Discharge status: Home / Self Care | Payer: Medicaid Other | Attending: Emergency Medicine | Admitting: Emergency Medicine

## 2021-01-29 DIAGNOSIS — Z79899 Other long term (current) drug therapy: Secondary | ICD-10-CM | POA: Diagnosis not present

## 2021-01-29 DIAGNOSIS — X58XXXA Exposure to other specified factors, initial encounter: Secondary | ICD-10-CM | POA: Insufficient documentation

## 2021-01-29 DIAGNOSIS — I10 Essential (primary) hypertension: Secondary | ICD-10-CM | POA: Insufficient documentation

## 2021-01-29 DIAGNOSIS — S39011A Strain of muscle, fascia and tendon of abdomen, initial encounter: Secondary | ICD-10-CM | POA: Insufficient documentation

## 2021-01-29 DIAGNOSIS — E119 Type 2 diabetes mellitus without complications: Secondary | ICD-10-CM | POA: Insufficient documentation

## 2021-01-29 DIAGNOSIS — J449 Chronic obstructive pulmonary disease, unspecified: Secondary | ICD-10-CM | POA: Insufficient documentation

## 2021-01-29 DIAGNOSIS — S76211A Strain of adductor muscle, fascia and tendon of right thigh, initial encounter: Secondary | ICD-10-CM

## 2021-01-29 DIAGNOSIS — F17210 Nicotine dependence, cigarettes, uncomplicated: Secondary | ICD-10-CM | POA: Diagnosis not present

## 2021-01-29 DIAGNOSIS — Z7984 Long term (current) use of oral hypoglycemic drugs: Secondary | ICD-10-CM | POA: Insufficient documentation

## 2021-01-29 DIAGNOSIS — S3991XA Unspecified injury of abdomen, initial encounter: Secondary | ICD-10-CM | POA: Diagnosis present

## 2021-01-29 LAB — COMPREHENSIVE METABOLIC PANEL
ALT: 56 U/L — ABNORMAL HIGH (ref 0–44)
AST: 36 U/L (ref 15–41)
Albumin: 3.9 g/dL (ref 3.5–5.0)
Alkaline Phosphatase: 63 U/L (ref 38–126)
Anion gap: 6 (ref 5–15)
BUN: 12 mg/dL (ref 6–20)
CO2: 28 mmol/L (ref 22–32)
Calcium: 8.8 mg/dL — ABNORMAL LOW (ref 8.9–10.3)
Chloride: 101 mmol/L (ref 98–111)
Creatinine, Ser: 0.85 mg/dL (ref 0.61–1.24)
GFR, Estimated: >60 mL/min (ref >60)
Glucose, Bld: 110 mg/dL — ABNORMAL HIGH (ref 70–99)
Potassium: 4.4 mmol/L (ref 3.5–5.1)
Sodium: 135 mmol/L (ref 135–145)
Total Bilirubin: 0.6 mg/dL (ref 0.3–1.2)
Total Protein: 7.1 g/dL (ref 6.5–8.1)

## 2021-01-29 LAB — CBC WITH DIFFERENTIAL/PLATELET
Abs Immature Granulocytes: 0.05 10*3/uL (ref 0.00–0.07)
Basophils Absolute: 0 10*3/uL (ref 0.0–0.1)
Basophils Relative: 0 %
Eosinophils Absolute: 0.3 10*3/uL (ref 0.0–0.5)
Eosinophils Relative: 3 %
HCT: 43.4 % (ref 39.0–52.0)
Hemoglobin: 14.8 g/dL (ref 13.0–17.0)
Immature Granulocytes: 0 %
Lymphocytes Relative: 35 %
Lymphs Abs: 4 10*3/uL (ref 0.7–4.0)
MCH: 31.6 pg (ref 26.0–34.0)
MCHC: 34.1 g/dL (ref 30.0–36.0)
MCV: 92.7 fL (ref 80.0–100.0)
Monocytes Absolute: 0.9 10*3/uL (ref 0.1–1.0)
Monocytes Relative: 8 %
Neutro Abs: 6 10*3/uL (ref 1.7–7.7)
Neutrophils Relative %: 54 %
Platelets: 171 10*3/uL (ref 150–400)
RBC: 4.68 MIL/uL (ref 4.22–5.81)
RDW: 14.4 % (ref 11.5–15.5)
WBC: 11.3 10*3/uL — ABNORMAL HIGH (ref 4.0–10.5)
nRBC: 0 % (ref 0.0–0.2)

## 2021-01-29 LAB — LACTIC ACID, PLASMA: Lactic Acid, Venous: 1.7 mmol/L (ref 0.5–1.9)

## 2021-01-29 MED ORDER — NAPROXEN 500 MG PO TABS: 500.0000 mg | ORAL_TABLET | Freq: Two times a day (BID) | ORAL | 2 refills | Status: DC

## 2021-01-29 MED ORDER — KETOROLAC TROMETHAMINE 30 MG/ML IJ SOLN
30.0000 mg | Freq: Once | INTRAMUSCULAR | Status: AC
  Administered 2021-01-29: 30 mg via INTRAVENOUS
  Filled 2021-01-29: qty 1

## 2021-01-29 MED ORDER — IOHEXOL 300 MG/ML  SOLN
100.0000 mL | Freq: Once | INTRAMUSCULAR | Status: AC | PRN
  Administered 2021-01-29: 100 mL via INTRAVENOUS

## 2021-01-29 MED ORDER — MORPHINE SULFATE (PF) 4 MG/ML IV SOLN
4.0000 mg | Freq: Once | INTRAVENOUS | Status: AC
  Administered 2021-01-29: 4 mg via INTRAVENOUS
  Filled 2021-01-29: qty 1

## 2021-01-29 NOTE — ED Triage Notes (Addendum)
Pt with right groin pain. Pt states he has a hernia and has been having pain since memorial day but has increased over the last 3 days. Pt appears in no acute distress.
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
## 2021-02-03 ENCOUNTER — Telehealth: Payer: Self-pay | Admitting: Anesthesiology

## 2021-02-03 DIAGNOSIS — G894 Chronic pain syndrome: Secondary | ICD-10-CM

## 2021-02-03 MED ORDER — PREDNISONE 20 MG PO TABS: ORAL_TABLET | ORAL | 0 refills | Status: AC

## 2021-02-03 NOTE — Telephone Encounter (Signed)
Are you willing to do this? 

## 2021-02-03 NOTE — Telephone Encounter (Signed)
Patient notified

## 2021-02-03 NOTE — Telephone Encounter (Signed)
Patient called. He is having increased pain wants to come in for injections. He has been referred to [facility]. I resent today.  Please check with Dr. Andree Elk and see if he can be scheduled on Monday for procedure, we have one open,.

## 2021-02-03 NOTE — Telephone Encounter (Signed)
Dr. Andree Elk is on vacation, so I cannot ask him. Please schedule pt for a VV Monday, so Dr. Andree Elk will know what kind of procedure to order.

## 2021-02-09 ENCOUNTER — Other Ambulatory Visit: Payer: Self-pay

## 2021-02-09 ENCOUNTER — Ambulatory Visit: Payer: Medicaid Other | Attending: Anesthesiology | Admitting: Anesthesiology

## 2021-02-24 ENCOUNTER — Telehealth: Payer: Medicaid Other | Admitting: Anesthesiology

## 2021-02-25 ENCOUNTER — Other Ambulatory Visit: Payer: Self-pay

## 2021-02-25 ENCOUNTER — Encounter: Payer: Self-pay | Admitting: Anesthesiology

## 2021-02-25 ENCOUNTER — Ambulatory Visit: Payer: Medicaid Other | Attending: Anesthesiology | Admitting: Anesthesiology

## 2021-02-25 DIAGNOSIS — M961 Postlaminectomy syndrome, not elsewhere classified: Secondary | ICD-10-CM

## 2021-02-25 DIAGNOSIS — M5136 Other intervertebral disc degeneration, lumbar region: Secondary | ICD-10-CM | POA: Diagnosis not present

## 2021-02-25 DIAGNOSIS — M5432 Sciatica, left side: Secondary | ICD-10-CM

## 2021-02-25 DIAGNOSIS — M47816 Spondylosis without myelopathy or radiculopathy, lumbar region: Secondary | ICD-10-CM

## 2021-02-25 DIAGNOSIS — F1910 Other psychoactive substance abuse, uncomplicated: Secondary | ICD-10-CM | POA: Diagnosis not present

## 2021-02-25 DIAGNOSIS — F1190 Opioid use, unspecified, uncomplicated: Secondary | ICD-10-CM

## 2021-02-25 DIAGNOSIS — M25551 Pain in right hip: Secondary | ICD-10-CM

## 2021-02-25 DIAGNOSIS — G894 Chronic pain syndrome: Secondary | ICD-10-CM | POA: Diagnosis not present

## 2021-02-25 DIAGNOSIS — M5431 Sciatica, right side: Secondary | ICD-10-CM

## 2021-02-25 DIAGNOSIS — M25552 Pain in left hip: Secondary | ICD-10-CM

## 2021-02-25 MED ORDER — OXYCODONE HCL 15 MG PO TABS: 15.0000 mg | ORAL_TABLET | ORAL | 0 refills | Status: DC | PRN

## 2021-02-25 MED ORDER — OXYCODONE HCL 15 MG PO TABS: 15.0000 mg | ORAL_TABLET | ORAL | 0 refills | Status: AC | PRN

## 2021-02-25 NOTE — Progress Notes (Signed)
Virtual Visit via Telephone Note  I connected with [name] on 02/25/21 at  1:30 PM EDT by telephone and verified that I am speaking with the correct person using two identifiers.  Location: Patient: Home  Provider: Pain center    I discussed the limitations, risks, security and privacy concerns of performing an evaluation and management service by telephone and the availability of in person appointments. I also discussed with the patient that there may be a patient responsible charge related to this service. The patient expressed understanding and agreed to proceed.   History of Present Illness: I spoke with Jeremy Long via telephone today as we were unable to link for the video portion of virtual conference and he states that he is doing reasonably well with the recent change in his opioid regimen to 5 times a day dosing.  His back pain has been under reasonable control.  The quality characteristic and distribution of the pain has been stable.  Denies any diverting or illicit use with his medicines and is getting approximately 75% relief for about 4 hours when he takes them.  He averages 5/day.  He occasionally uses some anti-inflammatories to assist.  His hip pain has been problematic on the right Long and he is getting some radiating pain into the right groin.  He has been followed by Dr. Rudene Christians for a probable total hip replacement here in the near future.  Otherwise he is in his usual state of health.  No changes in lower extremity strength or function or bowel or bladder function are noted at this time.  Review of systems: General: No fevers or chills Pulmonary: No shortness of breath or dyspnea Cardiac: No angina or palpitations or lightheadedness GI: No abdominal pain or constipation Psych: No depression    Observations/Objective:   Current Outpatient Medications:    [START ON 03/29/2021] oxyCODONE (ROXICODONE) 15 MG immediate release tablet, Take 1 tablet (15 mg total) by mouth every  4 (four) hours as needed for pain., Disp: 150 tablet, Rfl: 0   ACCU-CHEK GUIDE test strip, 1 EACH (1 STRIP TOTAL) BY XX ROUTE 3 (THREE) TIMES DAILY USE AS INSTRUCTED., Disp: , Rfl:    Accu-Chek Softclix Lancets lancets, 1 each 3 (three) times daily., Disp: , Rfl:    albuterol (VENTOLIN HFA) 108 (90 Base) MCG/ACT inhaler, SMARTSIG:2 Puff(s) By Mouth Every 6 Hours PRN, Disp: , Rfl:    allopurinol (ZYLOPRIM) 100 MG tablet, TAKE 1 TABLET BY MOUTH EVERY DAY, Disp: , Rfl:    Blood Glucose Monitoring Suppl (ACCU-CHEK GUIDE) w/Device KIT, See admin instructions., Disp: , Rfl:    FLUoxetine (PROZAC) 20 MG capsule, Take 20 mg by mouth every morning. (Patient not taking: No sig reported), Disp: , Rfl:    GAVILAX 17 GM/SCOOP powder, TAKE AS DIRECTED FOR COLONIC PREP. (Patient not taking: No sig reported), Disp: , Rfl:    glipiZIDE (GLUCOTROL XL) 10 MG 24 hr tablet, Take by mouth., Disp: , Rfl:    glipiZIDE (GLUCOTROL XL) 5 MG 24 hr tablet, Take 5 mg by mouth daily. (Patient not taking: No sig reported), Disp: , Rfl:    Glycopyrrolate-Formoterol (BEVESPI AEROSPHERE) 9-4.8 MCG/ACT AERO, Inhale into the lungs., Disp: , Rfl:    ibuprofen (ADVIL) 600 MG tablet, Take 600 mg by mouth every 6 (six) hours as needed., Disp: , Rfl:    lisinopril (ZESTRIL) 10 MG tablet, Take 10 mg by mouth daily., Disp: , Rfl:    LORazepam (ATIVAN) 1 MG tablet, Take 1 mg by  mouth 3 (three) times daily as needed. (Patient not taking: No sig reported), Disp: , Rfl: 0   metFORMIN (GLUCOPHAGE-XR) 500 MG 24 hr tablet, TAKE 4 TABLETS (2,000 MG TOTAL) BY MOUTH DAILY WITH DINNER TAKE FOR DIABETES., Disp: , Rfl:    morphine (MS CONTIN) 60 MG 12 hr tablet, Take 1 tablet (60 mg total) by mouth every 8 (eight) hours. Withdraw and discard applied fentanyl patch 12 hours prior to initiating new opioid therapy. (Patient not taking: Reported on 09/14/2020), Disp: 90 tablet, Rfl: 0   naloxone (NARCAN) nasal spray 4 mg/0.1 mL, As directed for opioid induced  respiratory depression, Disp: 1 each, Rfl: 2   naproxen (NAPROSYN) 500 MG tablet, Take 1 tablet (500 mg total) by mouth 2 (two) times daily with a meal., Disp: 20 tablet, Rfl: 2   [START ON 02/27/2021] oxyCODONE (ROXICODONE) 15 MG immediate release tablet, Take 1 tablet (15 mg total) by mouth every 4 (four) hours as needed for pain., Disp: 150 tablet, Rfl: 0   pantoprazole (PROTONIX) 40 MG tablet, Take 40 mg by mouth daily., Disp: , Rfl:    pioglitazone (ACTOS) 30 MG tablet, Take by mouth. (Patient not taking: No sig reported), Disp: , Rfl:    sildenafil (REVATIO) 20 MG tablet, TAKE 1 - 3 TABLETS AS DIRECTED. (Patient not taking: No sig reported), Disp: , Rfl: 3   varenicline (CHANTIX) 1 MG tablet, TAKE 1 TABLET (1 MG TOTAL) BY MOUTH 2 (TWO) TIMES DAILY FOR 30 DAYS, Disp: , Rfl:   Assessment and Plan: 1. Chronic pain syndrome   2. Chronic, continuous use of opioids   3. DDD (degenerative disc disease), lumbar   4. Drug abuse ([place])   5. Facet arthritis of lumbar region   6. Post laminectomy syndrome   7. Bilateral sciatica   8. Bilateral hip pain   9. Facet arthropathy, lumbar   Based on our discussion today and upon review of the [facility] practitioner database information it is appropriate for refills today.  These will be dated for August 14 and September 13.  No other changes in his pain management protocol will be initiated.  He seems to be doing well with this regimen with no evidence of problems with the medication or diverting or illicit use.  He continues to derive good functional benefit from them as well.  His recent urine screen was appropriate.  We will schedule him for 75-monthreturn.  He is to continue follow-up with Dr. MRudene Christiansfor his orthopedic problems and primary care physicians for his baseline medical care.  Follow Up Instructions:    I discussed the assessment and treatment plan with the patient. The patient was provided an opportunity to ask questions and all were  answered. The patient agreed with the plan and demonstrated an understanding of the instructions.   The patient was advised to call back or seek an in-person evaluation if the symptoms worsen or if the condition fails to improve as anticipated.  I provided 30 minutes of non-face-to-face time during this encounter.   JMolli Barrows MD

## 2021-03-10 NOTE — Telephone Encounter (Signed)
error 

## 2021-04-19 ENCOUNTER — Other Ambulatory Visit: Payer: Self-pay | Admitting: Orthopedic Surgery

## 2021-04-25 ENCOUNTER — Encounter: Payer: Self-pay | Admitting: Anesthesiology

## 2021-04-25 ENCOUNTER — Ambulatory Visit: Payer: Medicaid Other | Attending: Anesthesiology | Admitting: Anesthesiology

## 2021-04-25 ENCOUNTER — Other Ambulatory Visit: Payer: Self-pay

## 2021-04-25 DIAGNOSIS — J439 Emphysema, unspecified: Secondary | ICD-10-CM

## 2021-04-25 DIAGNOSIS — M47816 Spondylosis without myelopathy or radiculopathy, lumbar region: Secondary | ICD-10-CM

## 2021-04-25 DIAGNOSIS — M5136 Other intervertebral disc degeneration, lumbar region: Secondary | ICD-10-CM | POA: Diagnosis not present

## 2021-04-25 DIAGNOSIS — G894 Chronic pain syndrome: Secondary | ICD-10-CM | POA: Diagnosis not present

## 2021-04-25 DIAGNOSIS — F1910 Other psychoactive substance abuse, uncomplicated: Secondary | ICD-10-CM

## 2021-04-25 DIAGNOSIS — M25552 Pain in left hip: Secondary | ICD-10-CM

## 2021-04-25 DIAGNOSIS — M5432 Sciatica, left side: Secondary | ICD-10-CM

## 2021-04-25 DIAGNOSIS — M25551 Pain in right hip: Secondary | ICD-10-CM

## 2021-04-25 DIAGNOSIS — F1190 Opioid use, unspecified, uncomplicated: Secondary | ICD-10-CM

## 2021-04-25 DIAGNOSIS — M5431 Sciatica, right side: Secondary | ICD-10-CM

## 2021-04-25 DIAGNOSIS — M961 Postlaminectomy syndrome, not elsewhere classified: Secondary | ICD-10-CM

## 2021-04-25 MED ORDER — OXYCODONE HCL 15 MG PO TABS: 15.0000 mg | ORAL_TABLET | ORAL | 0 refills | Status: AC | PRN

## 2021-04-25 MED ORDER — OXYCODONE HCL 15 MG PO TABS: 15.0000 mg | ORAL_TABLET | ORAL | 0 refills | Status: DC | PRN

## 2021-04-25 NOTE — Progress Notes (Signed)
Virtual Visit via Telephone Note  I connected with Jeremy Long on 04/25/21 at  1:45 PM EDT by telephone and verified that I am speaking with the correct person using two identifiers.  Location: Patient: Home Provider: Pain control center   I discussed the limitations, risks, security and privacy concerns of performing an evaluation and management service by telephone and the availability of in person appointments. I also discussed with the patient that there may be a patient responsible charge related to this service. The patient expressed understanding and agreed to proceed.   History of Present Illness: I spoke with Jeremy Long via telephone today as he was unable link for the video portion of the virtual conference.  He reports that his low back pain has been stable in nature with no significant recent changes in strength or function or quality characteristic or distribution of his low back pain.  He still taking oxycodone 15 mg tablets approximately every 4 hours or so per pain for his low back.  He has no Long effects reported from the medication.  He gets approximately 50 to 70% relief.  In the past he was on Duragesic patches in addition to breakthrough short acting opioids in this current 15 mg protocol is working much better for him he reports.  He denies any diverting or illicit use or problems with the medication and the pain control in general has been sufficient.  He is trying to stay active but is having a lot of hip trouble and is due for a total hip replacement in November.  Otherwise he reports being in his usual state of health.  No change in lower extremity strength function or bowel bladder function is noted.  Review of systems: General: No fevers or chills Pulmonary: No shortness of breath or dyspnea Cardiac: No angina or palpitations or lightheadedness GI: No abdominal pain or constipation Psych: No depression    Observations/Objective:  Current Outpatient Medications:     [START ON 05/28/2021] oxyCODONE (ROXICODONE) 15 MG immediate release tablet, Take 1 tablet (15 mg total) by mouth every 4 (four) hours as needed for pain., Disp: 150 tablet, Rfl: 0   ACCU-CHEK GUIDE test strip, 1 EACH (1 STRIP TOTAL) BY XX ROUTE 3 (THREE) TIMES DAILY USE AS INSTRUCTED., Disp: , Rfl:    Accu-Chek Softclix Lancets lancets, 1 each 3 (three) times daily., Disp: , Rfl:    albuterol (VENTOLIN HFA) 108 (90 Base) MCG/ACT inhaler, SMARTSIG:2 Puff(s) By Mouth Every 6 Hours PRN, Disp: , Rfl:    allopurinol (ZYLOPRIM) 100 MG tablet, TAKE 1 TABLET BY MOUTH EVERY DAY, Disp: , Rfl:    Blood Glucose Monitoring Suppl (ACCU-CHEK GUIDE) w/Device KIT, See admin instructions., Disp: , Rfl:    FLUoxetine (PROZAC) 20 MG capsule, Take 20 mg by mouth every morning. (Patient not taking: No sig reported), Disp: , Rfl:    fluticasone (FLONASE) 50 MCG/ACT nasal spray, Place 2 sprays into both nostrils daily., Disp: , Rfl:    GAVILAX 17 GM/SCOOP powder, TAKE AS DIRECTED FOR COLONIC PREP. (Patient not taking: No sig reported), Disp: , Rfl:    glipiZIDE (GLUCOTROL XL) 10 MG 24 hr tablet, Take by mouth., Disp: , Rfl:    glipiZIDE (GLUCOTROL XL) 5 MG 24 hr tablet, Take 5 mg by mouth daily. (Patient not taking: No sig reported), Disp: , Rfl:    Glycopyrrolate-Formoterol (BEVESPI AEROSPHERE) 9-4.8 MCG/ACT AERO, Inhale into the lungs., Disp: , Rfl:    hydrocortisone 2.5 % cream, Apply topically., Disp: , Rfl:  ibuprofen (ADVIL) 600 MG tablet, Take 600 mg by mouth every 6 (six) hours as needed., Disp: , Rfl:    ketoconazole (NIZORAL) 2 % shampoo, Apply topically., Disp: , Rfl:    lisinopril (ZESTRIL) 10 MG tablet, Take 10 mg by mouth daily., Disp: , Rfl:    loratadine (CLARITIN) 10 MG tablet, Take 10 mg by mouth daily., Disp: , Rfl:    LORazepam (ATIVAN) 1 MG tablet, Take 1 mg by mouth 3 (three) times daily as needed. (Patient not taking: No sig reported), Disp: , Rfl: 0   metFORMIN (GLUCOPHAGE-XR) 500 MG 24 hr  tablet, TAKE 4 TABLETS (2,000 MG TOTAL) BY MOUTH DAILY WITH DINNER TAKE FOR DIABETES., Disp: , Rfl:    morphine (MS CONTIN) 60 MG 12 hr tablet, Take 1 tablet (60 mg total) by mouth every 8 (eight) hours. Withdraw and discard applied fentanyl patch 12 hours prior to initiating new opioid therapy. (Patient not taking: No sig reported), Disp: 90 tablet, Rfl: 0   naloxone (NARCAN) nasal spray 4 mg/0.1 mL, As directed for opioid induced respiratory depression, Disp: 1 each, Rfl: 2   naproxen (NAPROSYN) 375 MG tablet, Take 375 mg by mouth 2 (two) times daily., Disp: , Rfl:    naproxen (NAPROSYN) 500 MG tablet, Take 1 tablet (500 mg total) by mouth 2 (two) times daily with a meal., Disp: 20 tablet, Rfl: 2   [START ON 04/28/2021] oxyCODONE (ROXICODONE) 15 MG immediate release tablet, Take 1 tablet (15 mg total) by mouth every 4 (four) hours as needed for pain., Disp: 150 tablet, Rfl: 0   OZEMPIC, 0.25 OR 0.5 MG/DOSE, 2 MG/1.5ML SOPN, Inject 0.5 mg into the skin once a week., Disp: , Rfl:    pantoprazole (PROTONIX) 40 MG tablet, Take 40 mg by mouth daily., Disp: , Rfl:    pioglitazone (ACTOS) 30 MG tablet, Take by mouth. (Patient not taking: No sig reported), Disp: , Rfl:    sildenafil (REVATIO) 20 MG tablet, TAKE 1 - 3 TABLETS AS DIRECTED. (Patient not taking: No sig reported), Disp: , Rfl: 3   SPIRIVA HANDIHALER 18 MCG inhalation capsule, 1 capsule daily., Disp: , Rfl:    varenicline (CHANTIX) 1 MG tablet, TAKE 1 TABLET (1 MG TOTAL) BY MOUTH 2 (TWO) TIMES DAILY FOR 30 DAYS, Disp: , Rfl:    Assessment and Plan: 1. Chronic pain syndrome   2. Chronic, continuous use of opioids   3. DDD (degenerative disc disease), lumbar   4. Drug abuse ([place])   5. Facet arthritis of lumbar region   6. Post laminectomy syndrome   7. Bilateral sciatica   8. Bilateral hip pain   9. Facet arthropathy, lumbar   10. Pulmonary emphysema, unspecified emphysema type ([place])   Based on her discussion today and upon review of the  [facility] practitioner database information I think is appropriate for refill for his medications.  This be dated for October 13 and November 12.  This to be for 15 mg oxycodone tablets to be taken every 4 hours as needed.  He is to continue with follow-up with Dr. Rudene Christians.  I encouraged him to continue with stretching strengthening exercises.  He may also use some Tylenol in the morning when his pain appears to be most severe.  No other changes will be initiated today and he is to continue follow-up with his primary care physician for baseline medical care  Follow Up Instructions:    I discussed the assessment and treatment plan with the patient. The  patient was provided an opportunity to ask questions and all were answered. The patient agreed with the plan and demonstrated an understanding of the instructions.   The patient was advised to call back or seek an in-person evaluation if the symptoms worsen or if the condition fails to improve as anticipated.  I provided.  30 minutes of non-face-to-face time during this encounter.   Molli Barrows, MD

## 2021-04-27 ENCOUNTER — Inpatient Hospital Stay: Admission: RE | Admit: 2021-04-27 | Payer: Medicaid Other | Source: Ambulatory Visit

## 2021-05-03 ENCOUNTER — Other Ambulatory Visit: Payer: Medicaid Other

## 2021-05-05 SURGERY — Surgical Case
Anesthesia: *Unknown

## 2021-05-23 ENCOUNTER — Other Ambulatory Visit
Admission: RE | Admit: 2021-05-23 | Discharge: 2021-05-23 | Disposition: A | Discharge status: Home / Self Care | Payer: Medicaid Other | Source: Ambulatory Visit | Attending: Orthopedic Surgery | Admitting: Orthopedic Surgery

## 2021-05-23 NOTE — Pre-Procedure Instructions (Signed)
Patient was a no show for his pre op appointment today. Tried to call him at numbers listed in Epic, generic message left to contact us. Spoke to [place] from [facility] and she stated patient has decided to cancel his surgery until January due to transportation issues.

## 2021-05-31 ENCOUNTER — Other Ambulatory Visit: Payer: Medicaid Other

## 2021-06-21 ENCOUNTER — Encounter: Payer: Self-pay | Admitting: Anesthesiology

## 2021-06-21 ENCOUNTER — Other Ambulatory Visit: Payer: Self-pay

## 2021-06-21 ENCOUNTER — Ambulatory Visit: Payer: Medicaid Other | Attending: Anesthesiology | Admitting: Anesthesiology

## 2021-06-21 DIAGNOSIS — M25552 Pain in left hip: Secondary | ICD-10-CM

## 2021-06-21 DIAGNOSIS — F1910 Other psychoactive substance abuse, uncomplicated: Secondary | ICD-10-CM

## 2021-06-21 DIAGNOSIS — F1190 Opioid use, unspecified, uncomplicated: Secondary | ICD-10-CM

## 2021-06-21 DIAGNOSIS — M25551 Pain in right hip: Secondary | ICD-10-CM

## 2021-06-21 DIAGNOSIS — G8929 Other chronic pain: Secondary | ICD-10-CM

## 2021-06-21 DIAGNOSIS — G894 Chronic pain syndrome: Secondary | ICD-10-CM | POA: Diagnosis not present

## 2021-06-21 DIAGNOSIS — M961 Postlaminectomy syndrome, not elsewhere classified: Secondary | ICD-10-CM

## 2021-06-21 DIAGNOSIS — M47816 Spondylosis without myelopathy or radiculopathy, lumbar region: Secondary | ICD-10-CM

## 2021-06-21 DIAGNOSIS — M5431 Sciatica, right side: Secondary | ICD-10-CM

## 2021-06-21 DIAGNOSIS — M5136 Other intervertebral disc degeneration, lumbar region: Secondary | ICD-10-CM

## 2021-06-21 DIAGNOSIS — M5432 Sciatica, left side: Secondary | ICD-10-CM

## 2021-06-21 DIAGNOSIS — M25561 Pain in right knee: Secondary | ICD-10-CM

## 2021-06-21 HISTORY — DX: Pain in right knee: M25.561

## 2021-06-21 MED ORDER — OXYCODONE HCL 15 MG PO TABS: 15.0000 mg | ORAL_TABLET | ORAL | 0 refills | Status: AC | PRN

## 2021-06-21 MED ORDER — OXYCODONE HCL 15 MG PO TABS: 15.0000 mg | ORAL_TABLET | ORAL | 0 refills | Status: DC | PRN

## 2021-06-21 NOTE — Progress Notes (Signed)
Virtual Visit via Telephone Note  I connected with Jeremy Long on 06/21/21 at  1:15 PM EST by telephone and verified that I am speaking with the correct person using two identifiers.  Location: Patient: Home Provider: Pain control center   I discussed the limitations, risks, security and privacy concerns of performing an evaluation and management service by telephone and the availability of in person appointments. I also discussed with the patient that there may be a patient responsible charge related to this service. The patient expressed understanding and agreed to proceed.   History of Present Illness: I spoke with Jeremy Long via telephone as he was unable to do the video portion of the virtual conference he reports that he is still having considerable amount of pain in his low back right hip and right knee.  This is comparable to what he has experienced in the past.  It is without change.  His lower extremity strength and function are at baseline with no change in bowel or bladder function.  He still taking the oxycodone 15 mg tablets every 4 hours for approximately 5 tablets/day.  He has no side effects with the medication and unfortunately he has failed more conservative therapy.  This continues to be effective for him.  He denies any diverting or illicit use of the medications and is getting good relief that he describes approximately 50 to 75% improvement.  The medications enable him to stay functional active and continue to lose weight.  He reports that he lost approximately 30 pounds over the past 4 to 6 months.  He is sleeping better at night with the medications as well.  Otherwise he is in his usual state of health.  Review of systems: General: No fevers or chills Pulmonary: No shortness of breath or dyspnea Cardiac: No angina or palpitations or lightheadedness GI: No abdominal pain or constipation Psych: No depression    Observations/Objective:  Current Outpatient  Medications:    [START ON 07/27/2021] oxyCODONE (ROXICODONE) 15 MG immediate release tablet, Take 1 tablet (15 mg total) by mouth every 4 (four) hours as needed for pain., Disp: 150 tablet, Rfl: 0   ACCU-CHEK GUIDE test strip, 1 EACH (1 STRIP TOTAL) BY XX ROUTE 3 (THREE) TIMES DAILY USE AS INSTRUCTED., Disp: , Rfl:    Accu-Chek Softclix Lancets lancets, 1 each 3 (three) times daily., Disp: , Rfl:    albuterol (VENTOLIN HFA) 108 (90 Base) MCG/ACT inhaler, SMARTSIG:2 Puff(s) By Mouth Every 6 Hours PRN, Disp: , Rfl:    allopurinol (ZYLOPRIM) 100 MG tablet, TAKE 1 TABLET BY MOUTH EVERY DAY, Disp: , Rfl:    Blood Glucose Monitoring Suppl (ACCU-CHEK GUIDE) w/Device KIT, See admin instructions., Disp: , Rfl:    FLUoxetine (PROZAC) 20 MG capsule, Take 20 mg by mouth every morning. (Patient not taking: No sig reported), Disp: , Rfl:    fluticasone (FLONASE) 50 MCG/ACT nasal spray, Place 2 sprays into both nostrils daily., Disp: , Rfl:    GAVILAX 17 GM/SCOOP powder, TAKE AS DIRECTED FOR COLONIC PREP. (Patient not taking: No sig reported), Disp: , Rfl:    glipiZIDE (GLUCOTROL XL) 10 MG 24 hr tablet, Take by mouth., Disp: , Rfl:    glipiZIDE (GLUCOTROL XL) 5 MG 24 hr tablet, Take 5 mg by mouth daily. (Patient not taking: No sig reported), Disp: , Rfl:    Glycopyrrolate-Formoterol (BEVESPI AEROSPHERE) 9-4.8 MCG/ACT AERO, Inhale into the lungs., Disp: , Rfl:    hydrocortisone 2.5 % cream, Apply topically., Disp: , Rfl:      ibuprofen (ADVIL) 600 MG tablet, Take 600 mg by mouth every 6 (six) hours as needed., Disp: , Rfl:    ketoconazole (NIZORAL) 2 % shampoo, Apply topically., Disp: , Rfl:    lisinopril (ZESTRIL) 10 MG tablet, Take 10 mg by mouth daily., Disp: , Rfl:    loratadine (CLARITIN) 10 MG tablet, Take 10 mg by mouth daily., Disp: , Rfl:    LORazepam (ATIVAN) 1 MG tablet, Take 1 mg by mouth 3 (three) times daily as needed. (Patient not taking: No sig reported), Disp: , Rfl: 0   metFORMIN (GLUCOPHAGE-XR)  500 MG 24 hr tablet, TAKE 4 TABLETS (2,000 MG TOTAL) BY MOUTH DAILY WITH DINNER TAKE FOR DIABETES., Disp: , Rfl:    morphine (MS CONTIN) 60 MG 12 hr tablet, Take 1 tablet (60 mg total) by mouth every 8 (eight) hours. Withdraw and discard applied fentanyl patch 12 hours prior to initiating new opioid therapy. (Patient not taking: No sig reported), Disp: 90 tablet, Rfl: 0   naloxone (NARCAN) nasal spray 4 mg/0.1 mL, As directed for opioid induced respiratory depression, Disp: 1 each, Rfl: 2   naproxen (NAPROSYN) 375 MG tablet, Take 375 mg by mouth 2 (two) times daily., Disp: , Rfl:    naproxen (NAPROSYN) 500 MG tablet, Take 1 tablet (500 mg total) by mouth 2 (two) times daily with a meal., Disp: 20 tablet, Rfl: 2   [START ON 06/27/2021] oxyCODONE (ROXICODONE) 15 MG immediate release tablet, Take 1 tablet (15 mg total) by mouth every 4 (four) hours as needed for pain., Disp: 150 tablet, Rfl: 0   OZEMPIC, 0.25 OR 0.5 MG/DOSE, 2 MG/1.5ML SOPN, Inject 0.5 mg into the skin once a week., Disp: , Rfl:    pantoprazole (PROTONIX) 40 MG tablet, Take 40 mg by mouth daily., Disp: , Rfl:    pioglitazone (ACTOS) 30 MG tablet, Take by mouth. (Patient not taking: No sig reported), Disp: , Rfl:    sildenafil (REVATIO) 20 MG tablet, TAKE 1 - 3 TABLETS AS DIRECTED. (Patient not taking: No sig reported), Disp: , Rfl: 3   SPIRIVA HANDIHALER 18 MCG inhalation capsule, 1 capsule daily., Disp: , Rfl:    varenicline (CHANTIX) 1 MG tablet, TAKE 1 TABLET (1 MG TOTAL) BY MOUTH 2 (TWO) TIMES DAILY FOR 30 DAYS, Disp: , Rfl:   Assessment and Plan: 1. Chronic pain syndrome   2. Chronic, continuous use of opioids   3. DDD (degenerative disc disease), lumbar   4. Drug abuse ([place])   5. Facet arthritis of lumbar region   6. Post laminectomy syndrome   7. Bilateral sciatica   8. Bilateral hip pain   9. Facet arthropathy, lumbar   10. Chronic pain of right knee   Based on our discussion on it is appropriate to continue on his  current therapeutic regimen.  Refills will be called in for December 12 and January 11.  No other changes in his regimen will be initiated at this time.  I encouraged him to continue his efforts at weight loss and exercising as reviewed today.  No other changes will be initiated and he is to continue to follow with his primary care physicians for baseline medical care and continue follow-up with orthopedics for possible right hip surgery.  Follow Up Instructions:    I discussed the assessment and treatment plan with the patient. The patient was provided an opportunity to ask questions and all were answered. The patient agreed with the plan and demonstrated an understanding of the instructions.  The patient was advised to call back or seek an in-person evaluation if the symptoms worsen or if the condition fails to improve as anticipated.  I provided 30 minutes of non-face-to-face time during this encounter.   James G Adams, MD 

## 2021-07-05 ENCOUNTER — Telehealth: Payer: Self-pay | Admitting: Anesthesiology
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
## 2021-07-05 NOTE — Telephone Encounter (Signed)
Left voicemail, returning patient call.

## 2021-07-19 ENCOUNTER — Inpatient Hospital Stay: Admission: RE | Admit: 2021-07-19 | Payer: Medicaid Other | Source: Ambulatory Visit

## 2021-07-20 ENCOUNTER — Other Ambulatory Visit
Admission: RE | Admit: 2021-07-20 | Discharge: 2021-07-20 | Disposition: A | Discharge status: Home / Self Care | Payer: Medicaid Other | Source: Ambulatory Visit | Attending: Orthopedic Surgery | Admitting: Orthopedic Surgery

## 2021-07-20 ENCOUNTER — Other Ambulatory Visit: Payer: Self-pay

## 2021-07-20 VITALS — Ht 67.0 in | Wt 232.0 lb

## 2021-07-20 DIAGNOSIS — Z01818 Encounter for other preprocedural examination: Secondary | ICD-10-CM | POA: Diagnosis not present

## 2021-07-20 DIAGNOSIS — Z20822 Contact with and (suspected) exposure to covid-19: Secondary | ICD-10-CM | POA: Insufficient documentation

## 2021-07-20 DIAGNOSIS — R768 Other specified abnormal immunological findings in serum: Secondary | ICD-10-CM

## 2021-07-20 DIAGNOSIS — I10 Essential (primary) hypertension: Secondary | ICD-10-CM | POA: Diagnosis not present

## 2021-07-20 DIAGNOSIS — Z1152 Encounter for screening for COVID-19: Secondary | ICD-10-CM

## 2021-07-20 DIAGNOSIS — Z01812 Encounter for preprocedural laboratory examination: Secondary | ICD-10-CM

## 2021-07-20 DIAGNOSIS — E118 Type 2 diabetes mellitus with unspecified complications: Secondary | ICD-10-CM | POA: Diagnosis not present

## 2021-07-20 HISTORY — DX: Methicillin resistant Staphylococcus aureus infection, unspecified site: A49.02

## 2021-07-20 LAB — SURGICAL PCR SCREEN
MRSA, PCR: NEGATIVE
Staphylococcus aureus: NEGATIVE
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
## 2021-07-20 NOTE — Patient Instructions (Addendum)
Your procedure is scheduled on: Thursday, January 12 Report to the Registration Desk on the 1st floor of the [facility]. To find out your arrival time, please call [phone] between 1PM - 3PM on: Wednesday, January 11  REMEMBER: Instructions that are not followed completely may result in serious medical risk, up to and including death; or upon the discretion of your surgeon and anesthesiologist your surgery may need to be rescheduled.  Do not eat food after midnight the night before surgery.  No gum chewing, lozengers or hard candies.  You may however, drink water up to 2 hours before you are scheduled to arrive for your surgery. Do not drink anything within 2 hours of your scheduled arrival time.  TAKE THESE MEDICATIONS THE MORNING OF SURGERY WITH A SIP OF WATER:  Albuterol inhaler  Use inhalers on the day of surgery and bring to the hospital.  One week prior to surgery: starting January 5 Stop Anti-inflammatories (NSAIDS) such as Advil, Aleve, Ibuprofen, Motrin, Naproxen, Naprosyn and Aspirin based products such as Excedrin, Goodys Powder, BC Powder. Stop ANY OVER THE COUNTER supplements until after surgery. You may however, continue to take Tylenol if needed for pain up until the day of surgery.  No Alcohol for 24 hours before or after surgery.  No Smoking including e-cigarettes for 24 hours prior to surgery.  No chewable tobacco products for at least 6 hours prior to surgery.  No nicotine patches on the day of surgery.  Do not use any "recreational" drugs for at least a week prior to your surgery.  Please be advised that the combination of cocaine and anesthesia may have negative outcomes, up to and including death. If you test positive for cocaine, your surgery will be cancelled.  On the morning of surgery brush your teeth with toothpaste and water, you may rinse your mouth with mouthwash if you wish. Do not swallow any toothpaste or mouthwash.  Use CHG Soap as  directed on instruction sheet.  Do not wear jewelry, make-up, hairpins, clips or nail polish.  Do not wear lotions, powders, or perfumes.   Do not shave body from the neck down 48 hours prior to surgery just in case you cut yourself which could leave a site for infection.  Also, freshly shaved skin may become irritated if using the CHG soap.  Contact lenses, hearing aids and dentures may not be worn into surgery.  Do not bring valuables to the hospital. [facility] is not responsible for any missing/lost belongings or valuables.   Notify your doctor if there is any change in your medical condition (cold, fever, infection).  Wear comfortable clothing (specific to your surgery type) to the hospital.  After surgery, you can help prevent lung complications by doing breathing exercises.  Take deep breaths and cough every 1-2 hours. Your doctor may order a device called an Incentive Spirometer to help you take deep breaths. When coughing or sneezing, hold a pillow firmly against your incision with both hands. This is called splinting. Doing this helps protect your incision. It also decreases belly discomfort.  If you are being admitted to the hospital overnight, leave your suitcase in the car. After surgery it may be brought to your room.  If you are being discharged the day of surgery, you will not be allowed to drive home. You will need a responsible adult (18 years or older) to drive you home and stay with you that night.   If you are taking public transportation, you  will need to have a responsible adult (18 years or older) with you. Please confirm with your physician that it is acceptable to use public transportation.   Please call the [place] [facility] at [phone] if you have any questions about these instructions.  Surgery Visitation Policy:  Patients undergoing a surgery or procedure may have one family member or support person with them as long as that person  is not COVID-19 positive or experiencing its symptoms.  That person may remain in the waiting area during the procedure and may rotate out with other people.  Inpatient Visitation:    Visiting hours are 7 a.m. to 8 p.m. Up to two visitors ages 16+ are allowed at one time in a patient room. The visitors may rotate out with other people during the day. Visitors must check out when they leave, or other visitors will not be allowed. One designated support person may remain overnight. The visitor must pass COVID-19 screenings, use hand sanitizer when entering and exiting the patients room and wear a mask at all times, including in the patients room. Patients must also wear a mask when staff or their visitor are in the room. Masking is required regardless of vaccination status.

## 2021-07-26 ENCOUNTER — Other Ambulatory Visit: Admission: RE | Admit: 2021-07-26 | Payer: Medicaid Other | Source: Ambulatory Visit

## 2021-07-28 ENCOUNTER — Inpatient Hospital Stay: Admit: 2021-07-28 | Payer: Medicaid Other | Admitting: Orthopedic Surgery

## 2021-07-28 SURGERY — ARTHROPLASTY, HIP, TOTAL, ANTERIOR APPROACH
Anesthesia: Choice | Site: Hip | Laterality: Right

## 2021-07-28 SURGERY — Surgical Case
Anesthesia: *Unknown

## 2021-08-11 ENCOUNTER — Other Ambulatory Visit: Payer: Self-pay | Admitting: Orthopedic Surgery

## 2021-08-12 ENCOUNTER — Other Ambulatory Visit: Payer: Self-pay | Admitting: Orthopedic Surgery

## 2021-08-15 ENCOUNTER — Encounter: Payer: Medicaid Other | Admitting: Anesthesiology

## 2021-08-25 ENCOUNTER — Ambulatory Visit (HOSPITAL_BASED_OUTPATIENT_CLINIC_OR_DEPARTMENT_OTHER): Payer: Medicaid Other | Admitting: Anesthesiology

## 2021-08-25 ENCOUNTER — Other Ambulatory Visit: Admission: RE | Admit: 2021-08-25 | Payer: Medicaid Other | Source: Ambulatory Visit

## 2021-08-25 ENCOUNTER — Other Ambulatory Visit: Payer: Self-pay

## 2021-08-25 ENCOUNTER — Other Ambulatory Visit
Admission: RE | Admit: 2021-08-25 | Discharge: 2021-08-25 | Disposition: A | Discharge status: Home / Self Care | Payer: Medicaid Other | Source: Ambulatory Visit | Attending: Orthopedic Surgery | Admitting: Orthopedic Surgery

## 2021-08-25 ENCOUNTER — Encounter: Payer: Self-pay | Admitting: Anesthesiology

## 2021-08-25 VITALS — BP 132/85 | HR 76 | Temp 98.1°F | Resp 16 | Ht 66.0 in | Wt 229.0 lb

## 2021-08-25 DIAGNOSIS — M961 Postlaminectomy syndrome, not elsewhere classified: Secondary | ICD-10-CM | POA: Insufficient documentation

## 2021-08-25 DIAGNOSIS — Z20822 Contact with and (suspected) exposure to covid-19: Secondary | ICD-10-CM | POA: Diagnosis not present

## 2021-08-25 DIAGNOSIS — M5136 Other intervertebral disc degeneration, lumbar region: Secondary | ICD-10-CM | POA: Insufficient documentation

## 2021-08-25 DIAGNOSIS — F1190 Opioid use, unspecified, uncomplicated: Secondary | ICD-10-CM

## 2021-08-25 DIAGNOSIS — M25552 Pain in left hip: Secondary | ICD-10-CM | POA: Insufficient documentation

## 2021-08-25 DIAGNOSIS — M5432 Sciatica, left side: Secondary | ICD-10-CM

## 2021-08-25 DIAGNOSIS — G894 Chronic pain syndrome: Secondary | ICD-10-CM

## 2021-08-25 DIAGNOSIS — M47816 Spondylosis without myelopathy or radiculopathy, lumbar region: Secondary | ICD-10-CM

## 2021-08-25 DIAGNOSIS — M25551 Pain in right hip: Secondary | ICD-10-CM

## 2021-08-25 DIAGNOSIS — M549 Dorsalgia, unspecified: Secondary | ICD-10-CM | POA: Insufficient documentation

## 2021-08-25 DIAGNOSIS — Z96641 Presence of right artificial hip joint: Secondary | ICD-10-CM | POA: Insufficient documentation

## 2021-08-25 DIAGNOSIS — F1910 Other psychoactive substance abuse, uncomplicated: Secondary | ICD-10-CM | POA: Insufficient documentation

## 2021-08-25 DIAGNOSIS — Z01818 Encounter for other preprocedural examination: Secondary | ICD-10-CM | POA: Diagnosis present

## 2021-08-25 DIAGNOSIS — M5431 Sciatica, right side: Secondary | ICD-10-CM

## 2021-08-25 DIAGNOSIS — Z79891 Long term (current) use of opiate analgesic: Secondary | ICD-10-CM | POA: Diagnosis not present

## 2021-08-25 LAB — URINALYSIS, ROUTINE W REFLEX MICROSCOPIC
Bilirubin Urine: NEGATIVE
Glucose, UA: NEGATIVE mg/dL
Hgb urine dipstick: NEGATIVE
Ketones, ur: NEGATIVE mg/dL
Leukocytes,Ua: NEGATIVE
Nitrite: NEGATIVE
Protein, ur: NEGATIVE mg/dL
Specific Gravity, Urine: 1.014 (ref 1.005–1.030)
pH: 5 (ref 5.0–8.0)

## 2021-08-25 LAB — CBC WITH DIFFERENTIAL/PLATELET
Abs Immature Granulocytes: 0.04 10*3/uL (ref 0.00–0.07)
Basophils Absolute: 0.1 10*3/uL (ref 0.0–0.1)
Basophils Relative: 1 %
Eosinophils Absolute: 0.4 10*3/uL (ref 0.0–0.5)
Eosinophils Relative: 4 %
HCT: 44.3 % (ref 39.0–52.0)
Hemoglobin: 15.1 g/dL (ref 13.0–17.0)
Immature Granulocytes: 0 %
Lymphocytes Relative: 31 %
Lymphs Abs: 3 10*3/uL (ref 0.7–4.0)
MCH: 30.1 pg (ref 26.0–34.0)
MCHC: 34.1 g/dL (ref 30.0–36.0)
MCV: 88.4 fL (ref 80.0–100.0)
Monocytes Absolute: 0.7 10*3/uL (ref 0.1–1.0)
Monocytes Relative: 7 %
Neutro Abs: 5.5 10*3/uL (ref 1.7–7.7)
Neutrophils Relative %: 57 %
Platelets: 260 10*3/uL (ref 150–400)
RBC: 5.01 MIL/uL (ref 4.22–5.81)
RDW: 13.6 % (ref 11.5–15.5)
WBC: 9.8 10*3/uL (ref 4.0–10.5)
nRBC: 0 % (ref 0.0–0.2)

## 2021-08-25 LAB — COMPREHENSIVE METABOLIC PANEL
ALT: 16 U/L (ref 0–44)
AST: 20 U/L (ref 15–41)
Albumin: 4.4 g/dL (ref 3.5–5.0)
Alkaline Phosphatase: 67 U/L (ref 38–126)
Anion gap: 8 (ref 5–15)
BUN: 14 mg/dL (ref 6–20)
CO2: 26 mmol/L (ref 22–32)
Calcium: 9.4 mg/dL (ref 8.9–10.3)
Chloride: 102 mmol/L (ref 98–111)
Creatinine, Ser: 0.57 mg/dL — ABNORMAL LOW (ref 0.61–1.24)
GFR, Estimated: >60 mL/min (ref >60)
Glucose, Bld: 98 mg/dL (ref 70–99)
Potassium: 4 mmol/L (ref 3.5–5.1)
Sodium: 136 mmol/L (ref 135–145)
Total Bilirubin: 0.6 mg/dL (ref 0.3–1.2)
Total Protein: 7.5 g/dL (ref 6.5–8.1)

## 2021-08-25 LAB — TYPE AND SCREEN
ABO/RH(D): O POS
Antibody Screen: NEGATIVE

## 2021-08-25 MED ORDER — OXYCODONE HCL 15 MG PO TABS: 15.0000 mg | ORAL_TABLET | ORAL | 0 refills | Status: AC | PRN

## 2021-08-25 MED ORDER — OXYCODONE HCL 15 MG PO TABS: 15.0000 mg | ORAL_TABLET | ORAL | 0 refills | Status: DC | PRN

## 2021-08-26 ENCOUNTER — Ambulatory Visit: Payer: Medicaid Other | Admitting: Urgent Care

## 2021-08-26 ENCOUNTER — Ambulatory Visit: Payer: Medicaid Other | Admitting: Anesthesiology

## 2021-08-26 ENCOUNTER — Inpatient Hospital Stay
Admission: RE | Admit: 2021-08-26 | Discharge: 2021-08-28 | DRG: 470 | Disposition: A | Discharge status: Home Health | Payer: Medicaid Other | Attending: Orthopedic Surgery | Admitting: Orthopedic Surgery

## 2021-08-26 ENCOUNTER — Observation Stay: Payer: Medicaid Other

## 2021-08-26 ENCOUNTER — Other Ambulatory Visit: Payer: Self-pay

## 2021-08-26 ENCOUNTER — Encounter: Payer: Self-pay | Admitting: Orthopedic Surgery

## 2021-08-26 ENCOUNTER — Encounter
Admission: RE | Disposition: A | Discharge status: Home Health | Payer: Self-pay | Source: Home / Self Care | Attending: Orthopedic Surgery

## 2021-08-26 ENCOUNTER — Ambulatory Visit: Payer: Medicaid Other

## 2021-08-26 DIAGNOSIS — Z6836 Body mass index (BMI) 36.0-36.9, adult: Secondary | ICD-10-CM

## 2021-08-26 DIAGNOSIS — I10 Essential (primary) hypertension: Secondary | ICD-10-CM | POA: Diagnosis present

## 2021-08-26 DIAGNOSIS — B1920 Unspecified viral hepatitis C without hepatic coma: Secondary | ICD-10-CM | POA: Diagnosis present

## 2021-08-26 DIAGNOSIS — Z8701 Personal history of pneumonia (recurrent): Secondary | ICD-10-CM

## 2021-08-26 DIAGNOSIS — M5442 Lumbago with sciatica, left side: Secondary | ICD-10-CM | POA: Diagnosis present

## 2021-08-26 DIAGNOSIS — I959 Hypotension, unspecified: Secondary | ICD-10-CM | POA: Diagnosis not present

## 2021-08-26 DIAGNOSIS — M5136 Other intervertebral disc degeneration, lumbar region: Secondary | ICD-10-CM | POA: Diagnosis present

## 2021-08-26 DIAGNOSIS — G4733 Obstructive sleep apnea (adult) (pediatric): Secondary | ICD-10-CM | POA: Diagnosis present

## 2021-08-26 DIAGNOSIS — F17210 Nicotine dependence, cigarettes, uncomplicated: Secondary | ICD-10-CM | POA: Diagnosis present

## 2021-08-26 DIAGNOSIS — Z96641 Presence of right artificial hip joint: Secondary | ICD-10-CM

## 2021-08-26 DIAGNOSIS — G8929 Other chronic pain: Secondary | ICD-10-CM | POA: Diagnosis present

## 2021-08-26 DIAGNOSIS — I252 Old myocardial infarction: Secondary | ICD-10-CM

## 2021-08-26 DIAGNOSIS — Z825 Family history of asthma and other chronic lower respiratory diseases: Secondary | ICD-10-CM

## 2021-08-26 DIAGNOSIS — Z981 Arthrodesis status: Secondary | ICD-10-CM

## 2021-08-26 DIAGNOSIS — Z79899 Other long term (current) drug therapy: Secondary | ICD-10-CM

## 2021-08-26 DIAGNOSIS — M5441 Lumbago with sciatica, right side: Secondary | ICD-10-CM | POA: Diagnosis present

## 2021-08-26 DIAGNOSIS — Z8673 Personal history of transient ischemic attack (TIA), and cerebral infarction without residual deficits: Secondary | ICD-10-CM

## 2021-08-26 DIAGNOSIS — J449 Chronic obstructive pulmonary disease, unspecified: Secondary | ICD-10-CM | POA: Diagnosis present

## 2021-08-26 DIAGNOSIS — M47816 Spondylosis without myelopathy or radiculopathy, lumbar region: Secondary | ICD-10-CM | POA: Diagnosis present

## 2021-08-26 DIAGNOSIS — Z833 Family history of diabetes mellitus: Secondary | ICD-10-CM

## 2021-08-26 DIAGNOSIS — F419 Anxiety disorder, unspecified: Secondary | ICD-10-CM | POA: Diagnosis present

## 2021-08-26 DIAGNOSIS — Z8614 Personal history of Methicillin resistant Staphylococcus aureus infection: Secondary | ICD-10-CM

## 2021-08-26 DIAGNOSIS — Z885 Allergy status to narcotic agent status: Secondary | ICD-10-CM

## 2021-08-26 DIAGNOSIS — Z8249 Family history of ischemic heart disease and other diseases of the circulatory system: Secondary | ICD-10-CM

## 2021-08-26 DIAGNOSIS — G8918 Other acute postprocedural pain: Secondary | ICD-10-CM

## 2021-08-26 DIAGNOSIS — E669 Obesity, unspecified: Secondary | ICD-10-CM | POA: Diagnosis present

## 2021-08-26 DIAGNOSIS — M1611 Unilateral primary osteoarthritis, right hip: Principal | ICD-10-CM | POA: Diagnosis present

## 2021-08-26 DIAGNOSIS — E785 Hyperlipidemia, unspecified: Secondary | ICD-10-CM | POA: Diagnosis present

## 2021-08-26 DIAGNOSIS — G894 Chronic pain syndrome: Secondary | ICD-10-CM | POA: Diagnosis present

## 2021-08-26 DIAGNOSIS — K760 Fatty (change of) liver, not elsewhere classified: Secondary | ICD-10-CM | POA: Diagnosis present

## 2021-08-26 DIAGNOSIS — M109 Gout, unspecified: Secondary | ICD-10-CM | POA: Diagnosis present

## 2021-08-26 DIAGNOSIS — Z419 Encounter for procedure for purposes other than remedying health state, unspecified: Secondary | ICD-10-CM

## 2021-08-26 DIAGNOSIS — Z7984 Long term (current) use of oral hypoglycemic drugs: Secondary | ICD-10-CM

## 2021-08-26 DIAGNOSIS — E119 Type 2 diabetes mellitus without complications: Secondary | ICD-10-CM | POA: Diagnosis present

## 2021-08-26 HISTORY — PX: TOTAL HIP ARTHROPLASTY: SHX124

## 2021-08-26 LAB — CREATININE, SERUM
Creatinine, Ser: 0.83 mg/dL (ref 0.61–1.24)
GFR, Estimated: >60 mL/min (ref >60)

## 2021-08-26 LAB — GLUCOSE, CAPILLARY
Glucose-Capillary: 93 mg/dL (ref 70–99)
Glucose-Capillary: 129 mg/dL — ABNORMAL HIGH (ref 70–99)
Glucose-Capillary: 161 mg/dL — ABNORMAL HIGH (ref 70–99)

## 2021-08-26 LAB — CBC
HCT: 39.5 % (ref 39.0–52.0)
Hemoglobin: 13.4 g/dL (ref 13.0–17.0)
MCH: 31 pg (ref 26.0–34.0)
MCHC: 33.9 g/dL (ref 30.0–36.0)
MCV: 91.4 fL (ref 80.0–100.0)
Platelets: 245 10*3/uL (ref 150–400)
RBC: 4.32 MIL/uL (ref 4.22–5.81)
RDW: 13.4 % (ref 11.5–15.5)
WBC: 18.4 10*3/uL — ABNORMAL HIGH (ref 4.0–10.5)
nRBC: 0 % (ref 0.0–0.2)

## 2021-08-26 LAB — SARS CORONAVIRUS 2 (TAT 6-24 HRS): SARS Coronavirus 2: NEGATIVE

## 2021-08-26 LAB — ABO/RH: ABO/RH(D): O POS

## 2021-08-26 SURGERY — ARTHROPLASTY, HIP, TOTAL, ANTERIOR APPROACH
Anesthesia: General | Site: Hip | Laterality: Right

## 2021-08-26 MED ORDER — GLYCOPYRROLATE 0.2 MG/ML IJ SOLN
INTRAMUSCULAR | Status: AC
  Filled 2021-08-26: qty 1

## 2021-08-26 MED ORDER — KETAMINE HCL 10 MG/ML IJ SOLN
INTRAMUSCULAR | Status: DC | PRN
  Administered 2021-08-26: 10 mg via INTRAVENOUS
  Administered 2021-08-26: 30 mg via INTRAVENOUS

## 2021-08-26 MED ORDER — EPHEDRINE SULFATE (PRESSORS) 50 MG/ML IJ SOLN
INTRAMUSCULAR | Status: DC | PRN
  Administered 2021-08-26: 5 mg via INTRAVENOUS
  Administered 2021-08-26: 10 mg via INTRAVENOUS
  Administered 2021-08-26: 5 mg via INTRAVENOUS

## 2021-08-26 MED ORDER — PHENOL 1.4 % MT LIQD
1.0000 | OROMUCOSAL | Status: DC | PRN
  Filled 2021-08-26: qty 177

## 2021-08-26 MED ORDER — HYDROMORPHONE HCL 1 MG/ML IJ SOLN: 1.0000 mg | INTRAMUSCULAR | Status: AC

## 2021-08-26 MED ORDER — METHOCARBAMOL 1000 MG/10ML IJ SOLN
500.0000 mg | Freq: Four times a day (QID) | INTRAVENOUS | Status: DC | PRN
  Administered 2021-08-26: 500 mg via INTRAVENOUS
  Filled 2021-08-26 (×2): qty 5

## 2021-08-26 MED ORDER — BUPIVACAINE HCL (PF) 0.5 % IJ SOLN
INTRAMUSCULAR | Status: AC
  Filled 2021-08-26: qty 10

## 2021-08-26 MED ORDER — METOCLOPRAMIDE HCL 5 MG/ML IJ SOLN: 5.0000 mg | Freq: Three times a day (TID) | INTRAMUSCULAR | Status: DC | PRN

## 2021-08-26 MED ORDER — POLYETHYLENE GLYCOL 3350 17 G PO PACK: 17.0000 g | PACK | Freq: Every day | ORAL | Status: DC | PRN

## 2021-08-26 MED ORDER — MENTHOL 3 MG MT LOZG
1.0000 | LOZENGE | OROMUCOSAL | Status: DC | PRN
  Filled 2021-08-26: qty 9

## 2021-08-26 MED ORDER — MIDAZOLAM HCL 2 MG/2ML IJ SOLN
INTRAMUSCULAR | Status: DC | PRN
  Administered 2021-08-26 (×2): 2 mg via INTRAVENOUS

## 2021-08-26 MED ORDER — GABAPENTIN 600 MG PO TABS
300.0000 mg | ORAL_TABLET | Freq: Once | ORAL | Status: AC
  Administered 2021-08-26: 300 mg via ORAL
  Filled 2021-08-26: qty 0.5

## 2021-08-26 MED ORDER — HYDROMORPHONE HCL 1 MG/ML IJ SOLN
INTRAMUSCULAR | Status: AC
  Filled 2021-08-26: qty 1

## 2021-08-26 MED ORDER — MIDAZOLAM HCL 2 MG/2ML IJ SOLN
INTRAMUSCULAR | Status: AC
  Filled 2021-08-26: qty 2

## 2021-08-26 MED ORDER — ZOLPIDEM TARTRATE 5 MG PO TABS: 5.0000 mg | ORAL_TABLET | Freq: Every evening | ORAL | Status: DC | PRN

## 2021-08-26 MED ORDER — HYDROMORPHONE HCL 1 MG/ML IJ SOLN: 0.5000 mg | INTRAMUSCULAR | Status: DC | PRN

## 2021-08-26 MED ORDER — FENTANYL CITRATE (PF) 100 MCG/2ML IJ SOLN
INTRAMUSCULAR | Status: DC | PRN
  Administered 2021-08-26: 100 ug via INTRAVENOUS

## 2021-08-26 MED ORDER — HYDROMORPHONE HCL 1 MG/ML IJ SOLN
INTRAMUSCULAR | Status: AC
  Administered 2021-08-26: 0.5 mg via INTRAVENOUS
  Filled 2021-08-26: qty 1

## 2021-08-26 MED ORDER — BUPIVACAINE-EPINEPHRINE (PF) 0.25% -1:200000 IJ SOLN
INTRAMUSCULAR | Status: AC
  Filled 2021-08-26: qty 60

## 2021-08-26 MED ORDER — HEMOSTATIC AGENTS (NO CHARGE) OPTIME
TOPICAL | Status: DC | PRN
Start: 2021-08-26 — End: 2021-08-26
  Administered 2021-08-26: 2

## 2021-08-26 MED ORDER — PROPOFOL 10 MG/ML IV BOLUS
INTRAVENOUS | Status: DC | PRN
Start: 2021-08-26 — End: 2021-08-26
  Administered 2021-08-26: 40 mg via INTRAVENOUS
  Administered 2021-08-26: 200 mg via INTRAVENOUS

## 2021-08-26 MED ORDER — CHLORHEXIDINE GLUCONATE 0.12 % MT SOLN: 15.0000 mL | Freq: Once | OROMUCOSAL | Status: AC

## 2021-08-26 MED ORDER — CHLORHEXIDINE GLUCONATE 0.12 % MT SOLN
OROMUCOSAL | Status: AC
  Administered 2021-08-26: 15 mL
  Filled 2021-08-26: qty 15

## 2021-08-26 MED ORDER — DIPHENHYDRAMINE HCL 12.5 MG/5ML PO ELIX: 12.5000 mg | ORAL_SOLUTION | Freq: Four times a day (QID) | ORAL | Status: DC | PRN

## 2021-08-26 MED ORDER — OXYCODONE HCL 5 MG PO TABS
ORAL_TABLET | ORAL | Status: AC
  Administered 2021-08-26: 30 mg via ORAL
  Filled 2021-08-26: qty 6

## 2021-08-26 MED ORDER — FAMOTIDINE 20 MG PO TABS
20.0000 mg | ORAL_TABLET | Freq: Once | ORAL | Status: AC
Start: 2021-08-26 — End: 2021-08-26

## 2021-08-26 MED ORDER — ONDANSETRON HCL 4 MG PO TABS: 4.0000 mg | ORAL_TABLET | Freq: Four times a day (QID) | ORAL | Status: DC | PRN

## 2021-08-26 MED ORDER — CEFAZOLIN SODIUM-DEXTROSE 2-4 GM/100ML-% IV SOLN
2.0000 g | INTRAVENOUS | Status: AC
  Administered 2021-08-26: 2 g via INTRAVENOUS

## 2021-08-26 MED ORDER — OXYCODONE HCL 5 MG PO TABS
10.0000 mg | ORAL_TABLET | ORAL | Status: DC | PRN
  Administered 2021-08-26: 15 mg via ORAL

## 2021-08-26 MED ORDER — HYDROMORPHONE HCL 1 MG/ML IJ SOLN
INTRAMUSCULAR | Status: DC | PRN
  Administered 2021-08-26: .5 mg via INTRAVENOUS

## 2021-08-26 MED ORDER — ENOXAPARIN SODIUM 40 MG/0.4ML IJ SOSY
40.0000 mg | PREFILLED_SYRINGE | INTRAMUSCULAR | Status: DC
  Administered 2021-08-27 – 2021-08-28 (×2): 40 mg via SUBCUTANEOUS
  Filled 2021-08-26 (×2): qty 0.4

## 2021-08-26 MED ORDER — ALUM & MAG HYDROXIDE-SIMETH 200-200-20 MG/5ML PO SUSP: 30.0000 mL | ORAL | Status: DC | PRN

## 2021-08-26 MED ORDER — ORAL CARE MOUTH RINSE: 15.0000 mL | Freq: Once | OROMUCOSAL | Status: AC

## 2021-08-26 MED ORDER — KETAMINE HCL 10 MG/ML IJ SOLN
INTRAMUSCULAR | Status: DC | PRN
  Administered 2021-08-26 (×3): 10 mg via INTRAVENOUS

## 2021-08-26 MED ORDER — ALPRAZOLAM 0.25 MG PO TABS
0.2500 mg | ORAL_TABLET | Freq: Three times a day (TID) | ORAL | Status: DC | PRN
  Administered 2021-08-26 – 2021-08-27 (×2): 0.25 mg via ORAL
  Filled 2021-08-26 (×2): qty 1

## 2021-08-26 MED ORDER — SODIUM CHLORIDE (PF) 0.9 % IJ SOLN
INTRAMUSCULAR | Status: DC | PRN
  Administered 2021-08-26: 90 mL via INTRAMUSCULAR

## 2021-08-26 MED ORDER — BISACODYL 5 MG PO TBEC
5.0000 mg | DELAYED_RELEASE_TABLET | Freq: Every day | ORAL | Status: DC | PRN
  Administered 2021-08-27: 5 mg via ORAL
  Filled 2021-08-26: qty 1

## 2021-08-26 MED ORDER — HYDROMORPHONE HCL 1 MG/ML IJ SOLN
0.5000 mg | INTRAMUSCULAR | Status: DC | PRN
  Administered 2021-08-26: 1 mg via INTRAVENOUS
  Administered 2021-08-26: 0.5 mg via INTRAVENOUS
  Administered 2021-08-26: 1 mg via INTRAVENOUS

## 2021-08-26 MED ORDER — FLEET ENEMA 7-19 GM/118ML RE ENEM: 1.0000 | ENEMA | Freq: Once | RECTAL | Status: DC | PRN

## 2021-08-26 MED ORDER — KETAMINE HCL 50 MG/5ML IJ SOSY
PREFILLED_SYRINGE | INTRAMUSCULAR | Status: AC
  Filled 2021-08-26: qty 5

## 2021-08-26 MED ORDER — ACETAMINOPHEN 500 MG PO TABS: 1000.0000 mg | ORAL_TABLET | Freq: Once | ORAL | Status: AC

## 2021-08-26 MED ORDER — ACETAMINOPHEN 10 MG/ML IV SOLN: 1000.0000 mg | Freq: Once | INTRAVENOUS | Status: DC | PRN

## 2021-08-26 MED ORDER — HYDROMORPHONE 1 MG/ML IV SOLN
INTRAVENOUS | Status: DC
  Administered 2021-08-26: 2 mg via INTRAVENOUS
  Administered 2021-08-27: 5.4 mg via INTRAVENOUS
  Administered 2021-08-27: 2.4 mg via INTRAVENOUS
  Administered 2021-08-27: 2.7 mg via INTRAVENOUS
  Administered 2021-08-27: 3.9 mg via INTRAVENOUS
  Administered 2021-08-27: 6.6 mg via INTRAVENOUS
  Administered 2021-08-27: 2.9 mg via INTRAVENOUS
  Filled 2021-08-26: qty 30

## 2021-08-26 MED ORDER — KETOROLAC TROMETHAMINE 30 MG/ML IJ SOLN
INTRAMUSCULAR | Status: DC | PRN
  Administered 2021-08-26: 30 mg via INTRAVENOUS

## 2021-08-26 MED ORDER — ROCURONIUM BROMIDE 100 MG/10ML IV SOLN
INTRAVENOUS | Status: DC | PRN
  Administered 2021-08-26: 20 mg via INTRAVENOUS
  Administered 2021-08-26: 70 mg via INTRAVENOUS

## 2021-08-26 MED ORDER — CEFAZOLIN SODIUM-DEXTROSE 2-4 GM/100ML-% IV SOLN
2.0000 g | Freq: Four times a day (QID) | INTRAVENOUS | Status: AC
  Administered 2021-08-26 – 2021-08-27 (×2): 2 g via INTRAVENOUS
  Filled 2021-08-26 (×2): qty 100

## 2021-08-26 MED ORDER — OXYCODONE HCL 5 MG PO TABS
ORAL_TABLET | ORAL | Status: AC
  Filled 2021-08-26: qty 3

## 2021-08-26 MED ORDER — PROPOFOL 1000 MG/100ML IV EMUL
INTRAVENOUS | Status: AC
  Filled 2021-08-26: qty 100

## 2021-08-26 MED ORDER — BUPIVACAINE LIPOSOME 1.3 % IJ SUSP
INTRAMUSCULAR | Status: AC
  Filled 2021-08-26: qty 40

## 2021-08-26 MED ORDER — SUGAMMADEX SODIUM 200 MG/2ML IV SOLN
INTRAVENOUS | Status: DC | PRN
  Administered 2021-08-26: 200 mg via INTRAVENOUS

## 2021-08-26 MED ORDER — METOCLOPRAMIDE HCL 10 MG PO TABS: 5.0000 mg | ORAL_TABLET | Freq: Three times a day (TID) | ORAL | Status: DC | PRN

## 2021-08-26 MED ORDER — LIDOCAINE HCL (CARDIAC) PF 100 MG/5ML IV SOSY
PREFILLED_SYRINGE | INTRAVENOUS | Status: DC | PRN
  Administered 2021-08-26: 100 mg via INTRAVENOUS

## 2021-08-26 MED ORDER — PHENYLEPHRINE HCL (PRESSORS) 10 MG/ML IV SOLN
INTRAVENOUS | Status: DC | PRN
  Administered 2021-08-26 (×2): 160 ug via INTRAVENOUS

## 2021-08-26 MED ORDER — SODIUM CHLORIDE 0.9 % IR SOLN: Status: DC | PRN

## 2021-08-26 MED ORDER — NALOXONE HCL 0.4 MG/ML IJ SOLN: 0.4000 mg | INTRAMUSCULAR | Status: DC | PRN

## 2021-08-26 MED ORDER — SODIUM CHLORIDE 0.9 % IV SOLN: INTRAVENOUS | Status: DC

## 2021-08-26 MED ORDER — DOCUSATE SODIUM 100 MG PO CAPS
100.0000 mg | ORAL_CAPSULE | Freq: Two times a day (BID) | ORAL | Status: DC
  Administered 2021-08-26 – 2021-08-28 (×4): 100 mg via ORAL
  Filled 2021-08-26 (×5): qty 1

## 2021-08-26 MED ORDER — NEOMYCIN-POLYMYXIN B GU 40-200000 IR SOLN
Status: AC
  Filled 2021-08-26: qty 8

## 2021-08-26 MED ORDER — ROCURONIUM BROMIDE 10 MG/ML (PF) SYRINGE
PREFILLED_SYRINGE | INTRAVENOUS | Status: AC
  Filled 2021-08-26: qty 10

## 2021-08-26 MED ORDER — OXYCODONE HCL 5 MG PO TABS: 5.0000 mg | ORAL_TABLET | ORAL | Status: DC | PRN

## 2021-08-26 MED ORDER — OXYCODONE HCL 5 MG PO TABS
20.0000 mg | ORAL_TABLET | ORAL | Status: DC | PRN
  Administered 2021-08-27 – 2021-08-28 (×2): 20 mg via ORAL
  Filled 2021-08-26 (×4): qty 4

## 2021-08-26 MED ORDER — OXYCODONE HCL ER 10 MG PO T12A
20.0000 mg | EXTENDED_RELEASE_TABLET | Freq: Two times a day (BID) | ORAL | Status: DC
Start: 2021-08-26 — End: 2021-08-28
  Administered 2021-08-26 – 2021-08-27 (×3): 20 mg via ORAL
  Filled 2021-08-26 (×3): qty 2

## 2021-08-26 MED ORDER — FAMOTIDINE 20 MG PO TABS
ORAL_TABLET | ORAL | Status: AC
  Administered 2021-08-26: 20 mg via ORAL
  Filled 2021-08-26: qty 1

## 2021-08-26 MED ORDER — CEFAZOLIN SODIUM-DEXTROSE 2-4 GM/100ML-% IV SOLN
INTRAVENOUS | Status: AC
  Filled 2021-08-26: qty 100

## 2021-08-26 MED ORDER — DIPHENHYDRAMINE HCL 12.5 MG/5ML PO ELIX: 12.5000 mg | ORAL_SOLUTION | ORAL | Status: DC | PRN

## 2021-08-26 MED ORDER — SODIUM CHLORIDE FLUSH 0.9 % IV SOLN
INTRAVENOUS | Status: AC
  Filled 2021-08-26: qty 80

## 2021-08-26 MED ORDER — FENTANYL CITRATE (PF) 100 MCG/2ML IJ SOLN
INTRAMUSCULAR | Status: AC
  Filled 2021-08-26: qty 2

## 2021-08-26 MED ORDER — PANTOPRAZOLE SODIUM 40 MG PO TBEC
40.0000 mg | DELAYED_RELEASE_TABLET | Freq: Every day | ORAL | Status: DC
  Administered 2021-08-27: 40 mg via ORAL
  Filled 2021-08-26: qty 1

## 2021-08-26 MED ORDER — ONDANSETRON HCL 4 MG/2ML IJ SOLN: 4.0000 mg | Freq: Four times a day (QID) | INTRAMUSCULAR | Status: DC | PRN

## 2021-08-26 MED ORDER — TRAMADOL HCL 50 MG PO TABS
50.0000 mg | ORAL_TABLET | Freq: Four times a day (QID) | ORAL | Status: DC
  Administered 2021-08-26 – 2021-08-28 (×6): 50 mg via ORAL
  Filled 2021-08-26 (×6): qty 1

## 2021-08-26 MED ORDER — GLYCOPYRROLATE 0.2 MG/ML IJ SOLN
INTRAMUSCULAR | Status: DC | PRN
  Administered 2021-08-26: .2 mg via INTRAVENOUS

## 2021-08-26 MED ORDER — EPHEDRINE 5 MG/ML INJ
INTRAVENOUS | Status: AC
  Filled 2021-08-26: qty 5

## 2021-08-26 MED ORDER — ACETAMINOPHEN 500 MG PO TABS
ORAL_TABLET | ORAL | Status: AC
  Administered 2021-08-26: 1000 mg via ORAL
  Filled 2021-08-26: qty 2

## 2021-08-26 MED ORDER — ACETAMINOPHEN 325 MG PO TABS: 325.0000 mg | ORAL_TABLET | Freq: Four times a day (QID) | ORAL | Status: DC | PRN

## 2021-08-26 MED ORDER — PROMETHAZINE HCL 25 MG/ML IJ SOLN: 6.2500 mg | INTRAMUSCULAR | Status: DC | PRN

## 2021-08-26 MED ORDER — GABAPENTIN 300 MG PO CAPS
ORAL_CAPSULE | ORAL | Status: AC
  Filled 2021-08-26: qty 1

## 2021-08-26 MED ORDER — INSULIN ASPART 100 UNIT/ML IJ SOLN
0.0000 [IU] | Freq: Three times a day (TID) | INTRAMUSCULAR | Status: DC
  Filled 2021-08-26: qty 1

## 2021-08-26 MED ORDER — METHOCARBAMOL 500 MG PO TABS
500.0000 mg | ORAL_TABLET | Freq: Four times a day (QID) | ORAL | Status: DC | PRN
  Administered 2021-08-27: 500 mg via ORAL
  Filled 2021-08-26: qty 1

## 2021-08-26 MED ORDER — DIPHENHYDRAMINE HCL 50 MG/ML IJ SOLN: 12.5000 mg | Freq: Four times a day (QID) | INTRAMUSCULAR | Status: DC | PRN

## 2021-08-26 MED ORDER — HYDROMORPHONE HCL 1 MG/ML IJ SOLN
INTRAMUSCULAR | Status: AC
  Administered 2021-08-26: 1 mg via INTRAVENOUS
  Filled 2021-08-26: qty 1

## 2021-08-26 MED ORDER — DEXAMETHASONE SODIUM PHOSPHATE 10 MG/ML IJ SOLN
INTRAMUSCULAR | Status: DC | PRN
  Administered 2021-08-26: 10 mg via INTRAVENOUS

## 2021-08-26 MED ORDER — SEMAGLUTIDE(0.25 OR 0.5MG/DOS) 2 MG/1.5ML ~~LOC~~ SOPN: 0.5000 mg | PEN_INJECTOR | SUBCUTANEOUS | Status: DC

## 2021-08-26 MED ORDER — LISINOPRIL 10 MG PO TABS
10.0000 mg | ORAL_TABLET | Freq: Every day | ORAL | Status: DC
  Administered 2021-08-27: 10 mg via ORAL
  Filled 2021-08-26: qty 1

## 2021-08-26 MED ORDER — OXYCODONE HCL 5 MG PO TABS: 30.0000 mg | ORAL_TABLET | Freq: Once | ORAL | Status: AC

## 2021-08-26 MED ORDER — SODIUM CHLORIDE 0.9% FLUSH: 9.0000 mL | INTRAVENOUS | Status: DC | PRN

## 2021-08-26 MED ORDER — ONDANSETRON HCL 4 MG/2ML IJ SOLN
INTRAMUSCULAR | Status: DC | PRN
Start: 2021-08-26 — End: 2021-08-26
  Administered 2021-08-26: 4 mg via INTRAVENOUS

## 2021-08-26 SURGICAL SUPPLY — 61 items
BLADE SAGITTAL AGGR TOOTH XLG (BLADE) ×2 IMPLANT
BNDG COHESIVE 6X5 TAN ST LF (GAUZE/BANDAGES/DRESSINGS) ×5 IMPLANT
CANISTER WOUND CARE 500ML ATS (WOUND CARE) ×2 IMPLANT
CHLORAPREP W/TINT 26 (MISCELLANEOUS) ×2 IMPLANT
COVER BACK TABLE REUSABLE LG (DRAPES) ×2 IMPLANT
CUP ACETAB VERSA DBL 28X58 DMI (Orthopedic Implant) ×1 IMPLANT
DRAPE 3/4 80X56 (DRAPES) ×4 IMPLANT
DRAPE C-ARM XRAY 36X54 (DRAPES) ×2 IMPLANT
DRAPE INCISE IOBAN 66X60 STRL (DRAPES) ×1 IMPLANT
DRAPE POUCH INSTRU U-SHP 10X18 (DRAPES) ×2 IMPLANT
DRESSING SURGICEL FIBRLLR 1X2 (HEMOSTASIS) ×2 IMPLANT
DRSG MEPILEX SACRM 8.7X9.8 (GAUZE/BANDAGES/DRESSINGS) ×1 IMPLANT
DRSG OPSITE POSTOP 4X8 (GAUZE/BANDAGES/DRESSINGS) ×2 IMPLANT
DRSG SURGICEL FIBRILLAR 1X2 (HEMOSTASIS) ×4
ELECT BLADE 6.5 EXT (BLADE) ×2 IMPLANT
ELECT REM PT RETURN 9FT ADLT (ELECTROSURGICAL) ×2
ELECTRODE REM PT RTRN 9FT ADLT (ELECTROSURGICAL) ×1 IMPLANT
GLOVE SURG SYN 9.0  PF PI (GLOVE) ×4
GLOVE SURG SYN 9.0 PF PI (GLOVE) ×2 IMPLANT
GLOVE SURG UNDER POLY LF SZ9 (GLOVE) ×2 IMPLANT
GOWN SRG 2XL LVL 4 RGLN SLV (GOWNS) ×1 IMPLANT
GOWN STRL NON-REIN 2XL LVL4 (GOWNS) ×2
GOWN STRL REUS W/ TWL LRG LVL3 (GOWN DISPOSABLE) ×1 IMPLANT
GOWN STRL REUS W/TWL LRG LVL3 (GOWN DISPOSABLE) ×2
HEAD FEMORAL 28MM SZ M (Head) ×1 IMPLANT
HEMOVAC 400CC 10FR (MISCELLANEOUS) IMPLANT
HOLDER FOLEY CATH W/STRAP (MISCELLANEOUS) ×1 IMPLANT
KIT PREVENA INCISION MGT 13 (CANNISTER) ×2 IMPLANT
MANIFOLD NEPTUNE II (INSTRUMENTS) ×2 IMPLANT
MASTERLOC HIP LATERAL S6 (Hips) ×1 IMPLANT
MAT ABSORB  FLUID 56X50 GRAY (MISCELLANEOUS) ×2
MAT ABSORB FLUID 56X50 GRAY (MISCELLANEOUS) ×1 IMPLANT
NDL SAFETY ECLIPSE 18X1.5 (NEEDLE) ×1 IMPLANT
NDL SPNL 20GX3.5 QUINCKE YW (NEEDLE) ×2 IMPLANT
NEEDLE HYPO 18GX1.5 SHARP (NEEDLE)
NEEDLE SPNL 20GX3.5 QUINCKE YW (NEEDLE) ×4 IMPLANT
NS IRRIG 1000ML POUR BTL (IV SOLUTION) ×2 IMPLANT
PACK HIP COMPR (MISCELLANEOUS) ×2 IMPLANT
SCALPEL PROTECTED #10 DISP (BLADE) ×4 IMPLANT
SHELL ACETABULAR SZ0 58MM (Shell) ×1 IMPLANT
SOL PREP PVP 2OZ (MISCELLANEOUS)
SOLUTION PREP PVP 2OZ (MISCELLANEOUS) ×1 IMPLANT
SOLUTION PRONTOSAN WOUND 350ML (IRRIGATION / IRRIGATOR) IMPLANT
SPONGE DRAIN TRACH 4X4 STRL 2S (GAUZE/BANDAGES/DRESSINGS) ×1 IMPLANT
STAPLER SKIN PROX 35W (STAPLE) ×2 IMPLANT
STRAP SAFETY 5IN WIDE (MISCELLANEOUS) ×2 IMPLANT
SUT DVC 2 QUILL PDO  T11 36X36 (SUTURE) ×2
SUT DVC 2 QUILL PDO T11 36X36 (SUTURE) ×1 IMPLANT
SUT SILK 0 (SUTURE)
SUT SILK 0 30XBRD TIE 6 (SUTURE) ×1 IMPLANT
SUT V-LOC 90 ABS DVC 3-0 CL (SUTURE) ×2 IMPLANT
SUT VIC AB 1 CT1 36 (SUTURE) ×2 IMPLANT
SYR 20ML LL LF (SYRINGE) ×2 IMPLANT
SYR 30ML LL (SYRINGE) ×2 IMPLANT
SYR 50ML LL SCALE MARK (SYRINGE) ×4 IMPLANT
SYR BULB IRRIG 60ML STRL (SYRINGE) ×2 IMPLANT
TAPE MICROFOAM 4IN (TAPE) ×2 IMPLANT
TOWEL OR 17X26 4PK STRL BLUE (TOWEL DISPOSABLE) ×1 IMPLANT
TRAY FOLEY MTR SLVR 16FR STAT (SET/KITS/TRAYS/PACK) ×1 IMPLANT
WATER STERILE IRR 1000ML POUR (IV SOLUTION) ×1 IMPLANT
WATER STERILE IRR 500ML POUR (IV SOLUTION) ×1 IMPLANT

## 2021-08-26 NOTE — Anesthesia Procedure Notes (Signed)
Procedure Name: Intubation Date/Time: 08/26/2021 12:07 PM Performed by: Tollie Eth, CRNA Pre-anesthesia Checklist: Patient identified, Patient being monitored, Timeout performed, Emergency Drugs available and Suction available Patient Re-evaluated:Patient Re-evaluated prior to induction Oxygen Delivery Method: Circle system utilized Preoxygenation: Pre-oxygenation with 100% oxygen Induction Type: IV induction Ventilation: Mask ventilation without difficulty and Oral airway inserted - appropriate to patient size Laryngoscope Size: McGraph and 4 Grade View: Grade III Tube type: Oral Tube size: 7.5 mm Number of attempts: 1 Airway Equipment and Method: Stylet and Video-laryngoscopy Placement Confirmation: ETT inserted through vocal cords under direct vision, positive ETCO2 and breath sounds checked- equal and bilateral Secured at: 21 cm Tube secured with: Tape Dental Injury: Teeth and Oropharynx as per pre-operative assessment
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
## 2021-08-26 NOTE — Addendum Note (Signed)
Addendum  created 08/26/21 2102 by Iran Ouch, MD   Clinical Note Signed

## 2021-08-26 NOTE — Evaluation (Signed)
Physical Therapy Evaluation Patient Details Name: Jeremy Long MRN: [id number] DOB: 08/18/1965 Today's Date: 08/26/2021  History of Present Illness  Pt is a 56 yo M diagnosed with primary osteoarthritis of right hip and is s/p elective R THA. PMH includes COPD, HTN, DM, resp failure, chronic back pain, and lumbar fusion.   Clinical Impression  Pt motivated to participate during the session but was agitated throughout secondary to needing pain medication. Pt impulsive during the session and difficult to redirect.  Even with extensive cues/education for proper sequencing for general safety or for pain control pt was rarely able to follow sequencing correctly.  That being said pt performed well during the session, especially considering POD#0 status, requiring no physical assistance with any functional task and was steady with good control and stability with transfers and gait.  Pt is expected to make good progress towards goals while in acute care and will benefit from HHPT upon discharge to safely address deficits listed in patient problem list for decreased caregiver assistance and eventual return to PLOF.          Recommendations for follow up therapy are one component of a multi-disciplinary discharge planning process, led by the attending physician.  Recommendations may be updated based on patient status, additional functional criteria and insurance authorization.  Follow Up Recommendations Home health PT    Assistance Recommended at Discharge Intermittent Supervision/Assistance  Patient can return home with the following  A little help with walking and/or transfers;A little help with bathing/dressing/bathroom;Help with stairs or ramp for entrance;Assist for transportation    Equipment Recommendations Other (comment) (Deferred, TBD)  Recommendations for Other Services       Functional Status Assessment Patient has had a recent decline in their functional status and demonstrates the  ability to make significant improvements in function in a reasonable and predictable amount of time.     Precautions / Restrictions Precautions Precautions: Fall;Anterior Hip Precaution Booklet Issued: Yes (comment) Restrictions Weight Bearing Restrictions: Yes RLE Weight Bearing: Weight bearing as tolerated      Mobility  Bed Mobility Overal bed mobility: Modified Independent             General bed mobility comments: Min extra time and effort only    Transfers Overall transfer level: Needs assistance Equipment used: Rolling walker (2 wheels) Transfers: Sit to/from Stand Sit to Stand: Min guard           General transfer comment: Good eccentric and concentric control and stability with mod verbal cues for sequencing    Ambulation/Gait Ambulation/Gait assistance: Min guard Gait Distance (Feet): 3 Feet Assistive device: Rolling walker (2 wheels) Gait Pattern/deviations: Step-to pattern, Decreased stance time - right, Antalgic Gait velocity: decreased     General Gait Details: Pt limited by RLE pain in WB but unable to follow cuing for step-to WB pattern to attempt to minimize pain; pt steady taking steps with randon step-to pattern at EOB and from bed to chair  Stairs            Wheelchair Mobility    Modified Rankin (Stroke Patients Only)       Balance Overall balance assessment: Needs assistance   Sitting balance-Leahy Scale: Good     Standing balance support: Bilateral upper extremity supported, During functional activity Standing balance-Leahy Scale: Fair                               Pertinent Vitals/Pain Pain Assessment  Pain Assessment: 0-10 Pain Score: 9  Pain Location: R knee (no pain in hip per pt) Pain Descriptors / Indicators: Aching, Sore, Grimacing, Moaning Pain Intervention(s): Ice applied, Patient requesting pain meds-RN notified, Repositioned, Monitored during session    Home Living Family/patient expects to  be discharged to:: Unsure                   Additional Comments: Per patient, has no place to discharge home to. Was living with his ex-girlfriend but she has moved in with her son and he has no room for him.  Worst case scenario pt mentioned an aunt that has a trailer but said it would not be a safe place to discharge to.    Prior Function               Mobility Comments: Unable to obtain PLOF secondary to pt unable to be redirected from pain, hunger, and need to urinate concerns.  Pt highly agitated during the session.  Will need to attempt to obtain PLOF during subsequent sessions.       Hand Dominance        Extremity/Trunk Assessment   Upper Extremity Assessment Upper Extremity Assessment: Overall WFL for tasks assessed    Lower Extremity Assessment Lower Extremity Assessment: Generalized weakness;RLE deficits/detail RLE: Unable to fully assess due to pain       Communication   Communication: No difficulties  Cognition Arousal/Alertness: Awake/alert Behavior During Therapy: Agitated, Impulsive Overall Cognitive Status: No family/caregiver present to determine baseline cognitive functioning                                          General Comments      Exercises Other Exercises Other Exercises: Extensive education provided on step-to gait pattern sequencing for RLE pain control Other Exercises: Static standing at EOB for improved activity tolerance x 5 min Other Exercises: Positioning education with use of elevation and ice for pain/swelling control   Assessment/Plan    PT Assessment Patient needs continued PT services  PT Problem List Decreased strength;Decreased activity tolerance;Decreased balance;Decreased mobility;Decreased knowledge of use of DME;Decreased knowledge of precautions;Pain       PT Treatment Interventions DME instruction;Gait training;Stair training;Functional mobility training;Therapeutic activities;Therapeutic  exercise;Balance training;Patient/family education    PT Goals (Current goals can be found in the Care Plan section)  Acute Rehab PT Goals Patient Stated Goal: Decreased pain PT Goal Formulation: With patient Time For Goal Achievement: 09/08/21 Potential to Achieve Goals: Good    Frequency BID     Co-evaluation               AM-PAC PT "6 Clicks" Mobility  Outcome Measure Help needed turning from your back to your side while in a flat bed without using bedrails?: A Little Help needed moving from lying on your back to sitting on the side of a flat bed without using bedrails?: A Little Help needed moving to and from a bed to a chair (including a wheelchair)?: A Little Help needed standing up from a chair using your arms (e.g., wheelchair or bedside chair)?: A Little Help needed to walk in hospital room?: A Little Help needed climbing 3-5 steps with a railing? : A Lot 6 Click Score: 17    End of Session Equipment Utilized During Treatment: Gait belt Activity Tolerance: Patient limited by pain Patient left: in chair;with call bell/phone  within reach;with chair alarm set;with SCD's reapplied Nurse Communication: Mobility status;Weight bearing status PT Visit Diagnosis: Other abnormalities of gait and mobility (R26.89);Muscle weakness (generalized) (M62.81);Pain Pain - Right/Left: Right Pain - part of body: Knee    Time: 1625-1710 PT Time Calculation (min) (ACUTE ONLY): 45 min   Charges:   PT Evaluation $PT Eval Moderate Complexity: 1 Mod PT Treatments $Therapeutic Activity: 8-22 mins       D. Royetta Asal PT, DPT 08/26/21, 5:37 PM
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
## 2021-08-26 NOTE — Transfer of Care (Signed)
Immediate Anesthesia Transfer of Care Note  Patient: Jeremy Long  Procedure(s) Performed: TOTAL HIP ARTHROPLASTY ANTERIOR APPROACH (Right: Hip)  Patient Location: PACU  Anesthesia Type:General  Level of Consciousness: awake and alert   Airway & Oxygen Therapy: Patient Spontanous Breathing and Patient connected to face mask oxygen  Post-op Assessment: Report given to RN and Post -op Vital signs reviewed and stable  Post vital signs: Reviewed and stable  Last Vitals:  Vitals Value Taken Time  BP 144/82 08/26/21 1341  Temp 36.4 C 08/26/21 1341  Pulse 87 08/26/21 1345  Resp 19 08/26/21 1345  SpO2 90 % 08/26/21 1345  Vitals shown include unvalidated device data.  Last Pain:  Vitals:   08/26/21 1046  TempSrc: Temporal  PainSc: 5       Patients Stated Pain Goal: 0 (07/37/10 6269)  Complications: No notable events documented.

## 2021-08-26 NOTE — Progress Notes (Signed)
Dr. Rudene Christians called unit. Spoke with nursing staff. Requested to speak with patient.

## 2021-08-26 NOTE — Progress Notes (Signed)
Patient brought from room. Speaking very loudly, cursing at staff. Security present in room. Patient telling staff member trying to explain plan of care to shut the fuck up and yelling at staff. Unit director had spoken with patient. Patient was talking about his penis being small. Patient reassured we were not discussing his penis. Patient stated he wanted some medication for anxiety. Communicated request to Dr. Rudene Christians and issues we were having with patient.

## 2021-08-26 NOTE — Progress Notes (Addendum)
Pt keeps talking about his penis stating "I have a short pecker, don't judge me by my pecker".  Informed pt talking about his penis is not appropriate.  Pt was previously asking RN if he can rub her. Multiple times have had to redirect behavior yet again.  Pt has had inappropriate behavior since coming out of OR.  Pt also multiple times stated "fuck him, fuck him, I will hurt him" referring to dr Rudene Christians.  Will notify dr Rudene Christians of this behavior. Have multiple times discussed pain goals with pt after this type of surgery and informed him it is unrealistic to expect no pain when he declined the spinal that was offered.

## 2021-08-26 NOTE — Progress Notes (Signed)
Pt alert and oriented; however uncooperative.  Pt sitting up on the side of the bed with bed alarm on.  Bed alarm continues to alarm every 1-2 minutes due to pt moving around on the edge of the bed and refusing to sit up in recliner instead.  The sound of the alarm is causing the pt to become more and more agitated and belligerent towards staff.  This nurse and assigned nurse spoke to pt at the bedside and we agreed to turn off the bed alarm and the pt has agreed to not get up or over reach without calling for help.  BSC and walker moved away from bedside, personal items and call bell within reach.  Will continue to monitor and assess.

## 2021-08-26 NOTE — Progress Notes (Addendum)
Pt states "you are not taking me to a room, you are not making me leave here". Explained have given all that MD has said this RN can give.  Pt does not want to have to wait for RN on floor to call dr Rudene Christians when he tells them his pain meds don't work. Dr Wynetta Emery returned to bedside to speak with pt again.  Per dr Wynetta Emery ok for pt to have 1 more dose dilaudid in pacu and then can go to floor.

## 2021-08-26 NOTE — Progress Notes (Signed)
Dr. Rudene Christians in room talking with patient.

## 2021-08-26 NOTE — Op Note (Signed)
08/26/2021  1:48 PM  PATIENT:  Jeremy Long  56 y.o. male  PRE-OPERATIVE DIAGNOSIS:  Primary osteoarthritis of right hip  M16.11  POST-OPERATIVE DIAGNOSIS:  Primary osteoarthritis of right hip  M16.11  PROCEDURE:  Procedure(s): TOTAL HIP ARTHROPLASTY ANTERIOR APPROACH (Right)  SURGEON: Laurene Footman, MD  ASSISTANTS: none  ANESTHESIA:   general  EBL:  Total I/O In: 900 [I.V.:800; IV Piggyback:100] Out: 20 [Blood:20]  BLOOD ADMINISTERED:none  DRAINS:  Incisional wound    LOCAL MEDICATIONS USED:  MARCAINE    and OTHER Exparel  SPECIMEN: Right femoral head  DISPOSITION OF SPECIMEN:  PATHOLOGY  COUNTS:  YES  TOURNIQUET:  * No tourniquets in log *  IMPLANTS: Medacta Masterloc lateralized 6 stem with 58 mm Mpact DM cup and liner with M ceramic 28 mm head  DICTATION: .Dragon Dictation   The patient was brought to the operating room and after general anesthesia was obtained patient was placed on the operative table with the ipsilateral foot into the Medacta attachment, contralateral leg on a well-padded table. C-arm was brought in and preop template x-ray taken. After prepping and draping in usual sterile fashion appropriate patient identification and timeout procedures were completed. Anterior approach to the hip was obtained and centered over the greater trochanter and TFL muscle. The subcutaneous tissue was incised hemostasis being achieved by electrocautery. TFL fascia was incised and the muscle retracted laterally deep retractor placed. The lateral femoral circumflex vessels were identified and ligated. The anterior capsule was exposed and a capsulotomy performed. The neck was identified and a femoral neck cut carried out with a saw. The head was removed without difficulty and showed sclerotic femoral head and acetabulum. Reaming was carried out to 58 mm and a 58 mm cup trial gave appropriate tightness to the acetabular component a 58 DM cup was impacted into position. The leg  was then externally rotated and ischiofemoral and pubofemoral releases carried out. The femur was sequentially broached to a size 6, size 6 lateralized stem with S head trials were placed and the final components chosen. The 6 lateralized stem was inserted along with a ceramic M 28 mm head and 58 mm liner. The hip was reduced and was stable the wound was thoroughly irrigated with fibrillar placed along the posterior capsule and medial neck. The deep fascia ws closed using a heavy Quill after infiltration of 30 cc of quarter percent Sensorcaine with epinephrine diluted with Exparel throughout the case .3-0 V-loc to close the skin with skin staples.  Incisional wound VAC applied and patient was sent to recovery in stable condition.   PLAN OF CARE: Admit for overnight observation

## 2021-08-26 NOTE — Anesthesia Postprocedure Evaluation (Addendum)
Anesthesia Post Note  Patient: [name]  Procedure(s) Performed: TOTAL HIP ARTHROPLASTY ANTERIOR APPROACH (Right: Hip)  Patient location during evaluation: PACU Anesthesia Type: General Level of consciousness: awake and alert and patient cooperative Pain control: Pain difficult to control. Multimodal therapy administered. Pt states "I am doing okay" multiple times but intermittently would state he had uncontrolled knee pain.  Vital Signs Assessment: post-procedure vital signs reviewed and stable Respiratory status: spontaneous breathing, nonlabored ventilation and respiratory function stable Cardiovascular status: blood pressure returned to baseline and stable Postop Assessment: no apparent nausea or vomiting Anesthetic complications: no Comments: Pt reported right knee pain. Pt's knee pain was likely referred from hip. There were no signs of leg swelling or redness causing concern for DVT. The knee was not deformed, swollen, red. Pt was actively bending knee with no perceived exacerbation of pain. The nursing staff report that patient would converse as if he was not in distress at times but intermittently ask for assistance with pain control. There were plans for a PCA to be used. In light of patient's chronic pain condition and tolerance to opioids, he was discharged to the floor. His level of distress was similar to his appearance in pre-op where he could not lie still due to uncontrolled pain hip pain for which he asked for dilaudid to treat. He was given 30mg  oxycodone pre-op which was his reported amount he did not take as usual that morning.    No notable events documented.   Last Vitals:  Vitals:   08/26/21 1500 08/26/21 1521  BP: 111/88 (!) 125/94  Pulse: 84 82  Resp: 19 20  Temp: (!) 36.3 C (!) 36.2 C  SpO2: 97% 99%    Last Pain:  Vitals:   08/26/21 1510  TempSrc:   PainSc: 10-Worst pain ever                 [place] Ouch

## 2021-08-27 DIAGNOSIS — I959 Hypotension, unspecified: Secondary | ICD-10-CM | POA: Diagnosis not present

## 2021-08-27 DIAGNOSIS — B1920 Unspecified viral hepatitis C without hepatic coma: Secondary | ICD-10-CM | POA: Diagnosis present

## 2021-08-27 DIAGNOSIS — G894 Chronic pain syndrome: Secondary | ICD-10-CM | POA: Diagnosis present

## 2021-08-27 DIAGNOSIS — F419 Anxiety disorder, unspecified: Secondary | ICD-10-CM | POA: Diagnosis present

## 2021-08-27 DIAGNOSIS — M5441 Lumbago with sciatica, right side: Secondary | ICD-10-CM | POA: Diagnosis present

## 2021-08-27 DIAGNOSIS — F17210 Nicotine dependence, cigarettes, uncomplicated: Secondary | ICD-10-CM | POA: Diagnosis present

## 2021-08-27 DIAGNOSIS — Z6836 Body mass index (BMI) 36.0-36.9, adult: Secondary | ICD-10-CM | POA: Diagnosis not present

## 2021-08-27 DIAGNOSIS — M5442 Lumbago with sciatica, left side: Secondary | ICD-10-CM | POA: Diagnosis present

## 2021-08-27 DIAGNOSIS — Z981 Arthrodesis status: Secondary | ICD-10-CM | POA: Diagnosis not present

## 2021-08-27 DIAGNOSIS — E669 Obesity, unspecified: Secondary | ICD-10-CM | POA: Diagnosis present

## 2021-08-27 DIAGNOSIS — M47816 Spondylosis without myelopathy or radiculopathy, lumbar region: Secondary | ICD-10-CM | POA: Diagnosis present

## 2021-08-27 DIAGNOSIS — J449 Chronic obstructive pulmonary disease, unspecified: Secondary | ICD-10-CM | POA: Diagnosis present

## 2021-08-27 DIAGNOSIS — G4733 Obstructive sleep apnea (adult) (pediatric): Secondary | ICD-10-CM | POA: Diagnosis present

## 2021-08-27 DIAGNOSIS — E119 Type 2 diabetes mellitus without complications: Secondary | ICD-10-CM | POA: Diagnosis present

## 2021-08-27 DIAGNOSIS — M1611 Unilateral primary osteoarthritis, right hip: Secondary | ICD-10-CM | POA: Diagnosis present

## 2021-08-27 DIAGNOSIS — I10 Essential (primary) hypertension: Secondary | ICD-10-CM | POA: Diagnosis present

## 2021-08-27 DIAGNOSIS — I252 Old myocardial infarction: Secondary | ICD-10-CM | POA: Diagnosis not present

## 2021-08-27 DIAGNOSIS — Z79899 Other long term (current) drug therapy: Secondary | ICD-10-CM | POA: Diagnosis not present

## 2021-08-27 DIAGNOSIS — E785 Hyperlipidemia, unspecified: Secondary | ICD-10-CM | POA: Diagnosis present

## 2021-08-27 DIAGNOSIS — G8929 Other chronic pain: Secondary | ICD-10-CM | POA: Diagnosis present

## 2021-08-27 DIAGNOSIS — M5136 Other intervertebral disc degeneration, lumbar region: Secondary | ICD-10-CM | POA: Diagnosis present

## 2021-08-27 DIAGNOSIS — M109 Gout, unspecified: Secondary | ICD-10-CM | POA: Diagnosis present

## 2021-08-27 DIAGNOSIS — K760 Fatty (change of) liver, not elsewhere classified: Secondary | ICD-10-CM | POA: Diagnosis present

## 2021-08-27 DIAGNOSIS — Z7984 Long term (current) use of oral hypoglycemic drugs: Secondary | ICD-10-CM | POA: Diagnosis not present

## 2021-08-27 LAB — BASIC METABOLIC PANEL
Anion gap: 11 (ref 5–15)
BUN: 19 mg/dL (ref 6–20)
CO2: 22 mmol/L (ref 22–32)
Calcium: 9 mg/dL (ref 8.9–10.3)
Chloride: 102 mmol/L (ref 98–111)
Creatinine, Ser: 0.9 mg/dL (ref 0.61–1.24)
GFR, Estimated: >60 mL/min (ref >60)
Glucose, Bld: 151 mg/dL — ABNORMAL HIGH (ref 70–99)
Potassium: 4.5 mmol/L (ref 3.5–5.1)
Sodium: 135 mmol/L (ref 135–145)

## 2021-08-27 LAB — GLUCOSE, CAPILLARY
Glucose-Capillary: 129 mg/dL — ABNORMAL HIGH (ref 70–99)
Glucose-Capillary: 138 mg/dL — ABNORMAL HIGH (ref 70–99)
Glucose-Capillary: 121 mg/dL — ABNORMAL HIGH (ref 70–99)
Glucose-Capillary: 94 mg/dL (ref 70–99)

## 2021-08-27 LAB — CBC
HCT: 38.2 % — ABNORMAL LOW (ref 39.0–52.0)
Hemoglobin: 12.8 g/dL — ABNORMAL LOW (ref 13.0–17.0)
MCH: 30.7 pg (ref 26.0–34.0)
MCHC: 33.5 g/dL (ref 30.0–36.0)
MCV: 91.6 fL (ref 80.0–100.0)
Platelets: 260 10*3/uL (ref 150–400)
RBC: 4.17 MIL/uL — ABNORMAL LOW (ref 4.22–5.81)
RDW: 13.5 % (ref 11.5–15.5)
WBC: 15 10*3/uL — ABNORMAL HIGH (ref 4.0–10.5)
nRBC: 0 % (ref 0.0–0.2)

## 2021-08-27 MED ORDER — ALPRAZOLAM 0.5 MG PO TABS
0.5000 mg | ORAL_TABLET | Freq: Three times a day (TID) | ORAL | Status: DC | PRN
  Administered 2021-08-27 (×2): 0.5 mg via ORAL
  Filled 2021-08-27 (×2): qty 1

## 2021-08-27 NOTE — Evaluation (Signed)
Occupational Therapy Evaluation Patient Details Name: Jeremy Long MRN: [id number] DOB: [birth year] Today's Date: 08/27/2021   History of Present Illness Pt is a 56 yo M diagnosed with primary osteoarthritis of right hip and is s/p elective R THA. PMH includes COPD, HTN, DM, resp failure, chronic back pain, and lumbar fusion.   Clinical Impression   Pt seen for OT evaluation this date, POD#1 from above surgery. Difficult to get detailed PLOF/home set up 2/2 pt perseverating on pain meds and recounting stories regarding medical providers in his past who have refused him pain medications. Pt received seated EOB. Pt hyperverbal during session, requiring VC to attempt to redirect. Limited success to bring back to session goal. Pt asks about precautions then before therapist is able to share strategies and additional instruction, pt doffs then dons sock on R foot using figure 4 technique. Pt reporting significant pain at all times during session, primarily chronic back pain. OT attempted strategies to support improved comprehensive pain mgt with self mgt strategies such as distraction and pursed lip breathing, but pt was unable to maintain attention to task. MD in near end of session to further discuss care with pt. Pt required limited assist from therapist for ADL/mobility. Demo's decreased safety awareness and attention. Will benefit from additional skilled OT services to maximize safety/indep with ADL/mobility. Unsure of discharge plan at this time. Pt reports being eager to stay at the hospital for several more days then go to rehab due to having no safe option to discharge to. However, pt functionally doing well, does not require short term rehab level therapy at this time. Recommending HHOT services to address safety and home/routines modifications to support recovery.     Recommendations for follow up therapy are one component of a multi-disciplinary discharge planning process, led by the attending  physician.  Recommendations may be updated based on patient status, additional functional criteria and insurance authorization.   Follow Up Recommendations  Home health OT    Assistance Recommended at Discharge PRN  Patient can return home with the following A little help with walking and/or transfers;A little help with bathing/dressing/bathroom;Assist for transportation;Help with stairs or ramp for entrance;Assistance with cooking/housework    Functional Status Assessment  Patient has had a recent decline in their functional status and demonstrates the ability to make significant improvements in function in a reasonable and predictable amount of time.  Equipment Recommendations  BSC/3in1    Recommendations for Other Services       Precautions / Restrictions Precautions Precautions: Fall;Anterior Hip Precaution Booklet Issued: Yes (comment) Restrictions Weight Bearing Restrictions: Yes RLE Weight Bearing: Weight bearing as tolerated      Mobility Bed Mobility Overal bed mobility: Modified Independent                  Transfers Overall transfer level: Needs assistance Equipment used: Rolling walker (2 wheels) Transfers: Sit to/from Stand Sit to Stand: Supervision, Min guard           General transfer comment: VC for safety      Balance Overall balance assessment: Needs assistance Sitting-balance support: No upper extremity supported, Feet supported Sitting balance-Leahy Scale: Good     Standing balance support: Bilateral upper extremity supported, During functional activity Standing balance-Leahy Scale: Fair                             ADL either performed or assessed with clinical judgement   ADL  General ADL Comments: Pt demo's ability to doff/don sock on R foot using figure 4 technique without assist. SBA for ADL transfers +RW. Pt hyperverbal and difficult to provide  education/instruction in home/routines modifications and AE to support ADL performance     Vision         Perception     Praxis      Pertinent Vitals/Pain Pain Assessment Pain Assessment: Faces Faces Pain Scale: Hurts a little bit Pain Location: pt visually does not appear to be in much pain, however endorses severe pain at all times, primarily in his back. Uses PCA as soon as he is able, perseverates on pain Pain Intervention(s): Limited activity within patient's tolerance, Monitored during session, Premedicated before session     Hand Dominance     Extremity/Trunk Assessment Upper Extremity Assessment Upper Extremity Assessment: Overall WFL for tasks assessed   Lower Extremity Assessment Lower Extremity Assessment: RLE deficits/detail RLE Deficits / Details: post-op strength/ROM deficits expected, however, functionally minimally limited       Communication Communication Communication: No difficulties   Cognition Arousal/Alertness: Awake/alert Behavior During Therapy: Agitated, Impulsive, Restless Overall Cognitive Status: No family/caregiver present to determine baseline cognitive functioning                                 General Comments: alert and oriented, very hard to redirect, hyperverbal and appears anxious, perseverates on pain and past medical providers who refused him pain medication, decreased safety awareness     General Comments       Exercises     Shoulder Instructions      Home Living Family/patient expects to be discharged to:: Unsure                                 Additional Comments: Per patient, has no place to discharge home to. Was living with his ex-girlfriend but she has moved in with her son and he has no room for him.  Worst case scenario pt mentioned an aunt that has a trailer but said it would not be a safe place to discharge to.      Prior Functioning/Environment               Mobility  Comments: Unable to obtain PLOF secondary to pt unable to be redirected from pain and recounting past related to getting pain meds or being refused pain meds by previous providers          OT Problem List: Decreased strength;Pain;Decreased safety awareness;Decreased knowledge of use of DME or AE;Impaired balance (sitting and/or standing);Decreased knowledge of precautions      OT Treatment/Interventions: Self-care/ADL training;Therapeutic exercise;Therapeutic activities;Cognitive remediation/compensation;DME and/or AE instruction;Patient/family education    OT Goals(Current goals can be found in the care plan section) Acute Rehab OT Goals Patient Stated Goal: have more pain meds OT Goal Formulation: With patient Time For Goal Achievement: 09/10/21 Potential to Achieve Goals: Good ADL Goals Pt Will Transfer to Toilet: with modified independence;ambulating (elevated commode, LRAD PRN) Additional ADL Goal #1: Pt will complete bathing and dressing tasks with modified independence. Additional ADL Goal #2: Pt will verbalize plan for minimizing falls risk during ADL/IADL.  OT Frequency: Min 2X/week    Co-evaluation              AM-PAC OT "6 Clicks" Daily Activity     Outcome Measure Help from  another person eating meals?: None Help from another person taking care of personal grooming?: None Help from another person toileting, which includes using toliet, bedpan, or urinal?: A Little Help from another person bathing (including washing, rinsing, drying)?: A Little Help from another person to put on and taking off regular upper body clothing?: None Help from another person to put on and taking off regular lower body clothing?: A Little 6 Click Score: 21   End of Session    Activity Tolerance: Other (comment);Patient tolerated treatment well (anxious, hyperverbal, perseverates on pain related topics, difficult to redirect) Patient left: in bed;with call bell/phone within reach;Other  (comment) (seated EOB with MD)  OT Visit Diagnosis: Other abnormalities of gait and mobility (R26.89);Pain Pain - Right/Left:  (back)                Time: 1012-1040 OT Time Calculation (min): 28 min Charges:  OT General Charges $OT Visit: 1 Visit OT Evaluation $OT Eval Moderate Complexity: 1 Mod OT Treatments $Self Care/Home Management : 8-22 mins  Ardeth Perfect., MPH, MS, OTR/L ascom [phone] 08/27/21, 12:51 PM

## 2021-08-27 NOTE — Progress Notes (Addendum)
Physical Therapy Treatment Patient Details Name: Jeremy Long MRN: [id number] DOB: 1966/06/07 Today's Date: 08/27/2021   History of Present Illness Pt is a 56 yo M diagnosed with primary osteoarthritis of right hip and is s/p elective R THA. PMH includes COPD, HTN, DM, resp failure, chronic back pain, and lumbar fusion.    PT Comments    Author returned for BID/PM session. He was medicated ~ 30 minutes prior to session but continues to be severely impulsive and lacks overall safety awareness. Pt is A and O x 4. " I need to go to that rehab when I leave here." Chief Strategy Officer discussed how well pt is doing and that he does not require that amount of assistance. Pt I'ly was ambulating in room without AD upon arriving. He required re-education of importance of using RW and doing things safe. He states understanding however throughout remainder of session, continued to be impulsive. He was easily and safely able to ambulate 200 ft with RW and perform stairs without physical assistance. Pt is progressing well overall from a PT standpoint. Has demonstrated safe enough abilities to return home at DC. Acute PT will continue to follow and progress as able per current POC. RN staff aware that pt is unwilling to use safety alarms/devices even when instructed to.    Recommendations for follow up therapy are one component of a multi-disciplinary discharge planning process, led by the attending physician.  Recommendations may be updated based on patient status, additional functional criteria and insurance authorization.  Follow Up Recommendations  Home health PT     Assistance Recommended at Discharge PRN  Patient can return home with the following A little help with walking and/or transfers;A little help with bathing/dressing/bathroom;Help with stairs or ramp for entrance;Assist for transportation   Equipment Recommendations  BSC/3in1;Rolling walker (2 wheels)       Precautions / Restrictions  Precautions Precautions: Fall;Anterior Hip Precaution Booklet Issued: Yes (comment) Precaution Comments: Extremely impulsive Restrictions Weight Bearing Restrictions: Yes RLE Weight Bearing: Weight bearing as tolerated     Mobility  Bed Mobility Overal bed mobility: Modified Independent       General bed mobility comments: no physical assistanmce required. Has been observed getting OOB I'ly throughout the night and this morning. Was educated on bed alarm for safety however pt unwilling to use. He was sitting EOB upon arriving but was able to sit to supine after OOB activity without assistance.    Transfers Overall transfer level: Needs assistance Equipment used: Rolling walker (2 wheels) Transfers: Sit to/from Stand Sit to Stand: Supervision           General transfer comment: Supervision only with constant vcs for imporved safety. Pt 's impulsivity and poor safety awareness are a concern. Physically doing extremely well.    Ambulation/Gait Ambulation/Gait assistance: Supervision Gait Distance (Feet): 200 Feet Assistive device: Rolling walker (2 wheels) Gait Pattern/deviations: Step-through pattern Gait velocity: WNL     General Gait Details: pt continues to demontrate poor overall safety awareness however no LOB or safety concern when using RW. He impulsivelty lets go of RW several times without warning.   Stairs Stairs: Yes Stairs assistance: Supervision Stair Management: Step to pattern Number of Stairs: 4 General stair comments: Author did best to teach pt proper sequencing with stairs however due to pt's poor safety awareness, he ascending descending stairs with leading with LLE on way down. Pt was educated on safety but overall pt was able to perform stairs without any safety concerns.  Balance Overall balance assessment: Modified Independent Sitting-balance support: No upper extremity supported, Feet supported Sitting balance-Leahy Scale: Good      Standing balance support: Bilateral upper extremity supported, During functional activity Standing balance-Leahy Scale: Fair       Cognition Arousal/Alertness: Awake/alert Behavior During Therapy: WFL for tasks assessed/performed Overall Cognitive Status: No family/caregiver present to determine baseline cognitive functioning      General Comments: alert and oriented, very hard to redirect, hyperverbal and appears anxious, perseverates on pain and past medical providers who refused him pain medication, decreased safety awareness               Pertinent Vitals/Pain Pain Assessment Pain Assessment: 0-10 Pain Score: 8  Faces Pain Scale: Hurts whole lot Pain Location: pt visually does not appear to be in much pain, however endorses severe pain at all times, primarily in his back. Uses PCA as soon as he is able, perseverates on pain Pain Descriptors / Indicators: Aching, Sore, Grimacing, Moaning Pain Intervention(s): Limited activity within patient's tolerance, Monitored during session, Premedicated before session, Repositioned, Ice applied    Home Living Family/patient expects to be discharged to:: Unsure        Additional Comments: Per patient, has no place to discharge home to. Was living with his ex-girlfriend but she has moved in with her son and he has no room for him.  Worst case scenario pt mentioned an aunt that has a trailer but said it would not be a safe place to discharge to.        PT Goals (current goals can now be found in the care plan section) Acute Rehab PT Goals Patient Stated Goal: Decreased pain Progress towards PT goals: Progressing toward goals    Frequency    BID      PT Plan Current plan remains appropriate       AM-PAC PT "6 Clicks" Mobility   Outcome Measure  Help needed turning from your back to your side while in a flat bed without using bedrails?: A Little Help needed moving from lying on your back to sitting on the side of a flat bed  without using bedrails?: A Little Help needed moving to and from a bed to a chair (including a wheelchair)?: A Little Help needed standing up from a chair using your arms (e.g., wheelchair or bedside chair)?: A Little Help needed to walk in hospital room?: A Little Help needed climbing 3-5 steps with a railing? : A Little 6 Click Score: 18    End of Session Equipment Utilized During Treatment: Gait belt Activity Tolerance: Patient limited by pain Patient left: in bed;with bed alarm set;with call bell/phone within reach;with family/visitor present Nurse Communication: Mobility status;Weight bearing status PT Visit Diagnosis: Other abnormalities of gait and mobility (R26.89);Muscle weakness (generalized) (M62.81);Pain Pain - Right/Left: Right Pain - part of body: Knee     Time: 7253-6644 PT Time Calculation (min) (ACUTE ONLY): 30 min  Charges:  $Gait Training: 23-37 mins $Therapeutic Activity: 8-22 mins                    Julaine Fusi PTA 08/27/21, 3:08 PM

## 2021-08-27 NOTE — Progress Notes (Signed)
Physical Therapy Treatment Patient Details Name: Jeremy Long MRN: [id number] DOB: 11-10-1965 Today's Date: 08/27/2021   History of Present Illness Pt is a 56 yo M diagnosed with primary osteoarthritis of right hip and is s/p elective R THA. PMH includes COPD, HTN, DM, resp failure, chronic back pain, and lumbar fusion.    PT Comments    Pt was independently sitting EOB upon arriving. He refuses bed alarm and continues to be severely impulsive. Is easily distracted and needs constant cues to redirect to focus on desired task. He states he does not have a safe DC destination however required no physical assistance throughout session. Vcs only for improved safety and to slow down. Poor overall safety awareness due to impulsivity and lac of sight of current deficits. Pt c/o severe (9/10) pain however demonstrated no visible signs of pain (on PCA pump) throughout. Pt is anxious about Dc to current home," It's not a safe place to go. My ex wife said I could go to her house however not till Thursday or Friday."  Author feels pt is moving well enough to Dc home with HHPT. Acute PT will continue to follow and progress as able per current POC.     Recommendations for follow up therapy are one component of a multi-disciplinary discharge planning process, led by the attending physician.  Recommendations may be updated based on patient status, additional functional criteria and insurance authorization.  Follow Up Recommendations  Home health PT     Assistance Recommended at Discharge Set up Supervision/Assistance  Patient can return home with the following A little help with walking and/or transfers;A little help with bathing/dressing/bathroom;Help with stairs or ramp for entrance;Assist for transportation   Equipment Recommendations  BSC/3in1;Rolling walker (2 wheels)       Precautions / Restrictions Precautions Precautions: Fall;Anterior Hip Precaution Booklet Issued: Yes (comment) Precaution  Comments: Extremely impulsive Restrictions Weight Bearing Restrictions: Yes RLE Weight Bearing: Weight bearing as tolerated     Mobility  Bed Mobility Overal bed mobility: Modified Independent      General bed mobility comments: no physical assistanmce required. Has been observed getting OOB I'ly throughout the night and this morning. Was educated on bed alarm for safety however pt unwilling to use. He was sitting EOB upon arriving but was able to sit to supine after OOB activity without assistance.    Transfers Overall transfer level: Needs assistance Equipment used: Rolling walker (2 wheels) Transfers: Sit to/from Stand Sit to Stand: Supervision      General transfer comment: Supervision only with constant vcs for imporved safety. Pt 's impulsivity and poor safety awareness are a concern. Physically doing extremely well.    Ambulation/Gait Ambulation/Gait assistance: Supervision Gait Distance (Feet): 200 Feet Assistive device: Rolling walker (2 wheels) Gait Pattern/deviations: Step-to pattern, Decreased stance time - right, Antalgic Gait velocity: WNL     General Gait Details: Pt was safely and easily able to ambulate 200 ft without physical assistance or safety concern. Again safety awarenss and impulsivity greatly impacts overall session progression. needs constant redirecting and education for safety   Stairs Stairs:  (Pt unwilling even when encouarged to. " I cant do too good or my x wife said they wont let me go to rehab." Will address in future sessions)       Balance Overall balance assessment: Modified Independent         Cognition Arousal/Alertness: Awake/alert Behavior During Therapy: Impulsive, Restless, WFL for tasks assessed/performed Overall Cognitive Status: No family/caregiver present to determine baseline  cognitive functioning      General Comments: alert and oriented, very hard to redirect, hyperverbal and appears anxious, perseverates on pain and  past medical providers who refused him pain medication, decreased safety awareness               Pertinent Vitals/Pain Pain Assessment Pain Assessment: 0-10 Pain Score: 8  Faces Pain Scale: Hurts whole lot Pain Location: pt visually does not appear to be in much pain, however endorses severe pain at all times, primarily in his back. Uses PCA as soon as he is able, perseverates on pain Pain Descriptors / Indicators: Aching, Sore, Grimacing, Moaning Pain Intervention(s): Limited activity within patient's tolerance, Monitored during session, Premedicated before session, Repositioned    Home Living Family/patient expects to be discharged to:: Unsure                   Additional Comments: Per patient, has no place to discharge home to. Was living with his ex-girlfriend but she has moved in with her son and he has no room for him.  Worst case scenario pt mentioned an aunt that has a trailer but said it would not be a safe place to discharge to.    Prior Function            PT Goals (current goals can now be found in the care plan section) Acute Rehab PT Goals Patient Stated Goal: Decreased pain Progress towards PT goals: Progressing toward goals    Frequency    BID      PT Plan Current plan remains appropriate       AM-PAC PT "6 Clicks" Mobility   Outcome Measure  Help needed turning from your back to your side while in a flat bed without using bedrails?: A Little Help needed moving from lying on your back to sitting on the side of a flat bed without using bedrails?: A Little Help needed moving to and from a bed to a chair (including a wheelchair)?: A Little Help needed standing up from a chair using your arms (e.g., wheelchair or bedside chair)?: A Little Help needed to walk in hospital room?: A Little Help needed climbing 3-5 steps with a railing? : A Little 6 Click Score: 18    End of Session   Activity Tolerance: Patient limited by pain Patient left: in  bed;with bed alarm set;with call bell/phone within reach;with family/visitor present Nurse Communication: Mobility status;Weight bearing status PT Visit Diagnosis: Other abnormalities of gait and mobility (R26.89);Muscle weakness (generalized) (M62.81);Pain Pain - Right/Left: Right Pain - part of body: Knee     Time: 2353-6144 PT Time Calculation (min) (ACUTE ONLY): 25 min  Charges:  $Gait Training: 8-22 mins $Therapeutic Activity: 8-22 mins                    Julaine Fusi PTA 08/27/21, 1:20 PM

## 2021-08-27 NOTE — Progress Notes (Signed)
Pt A&Ox4; is being uncooperative with care. Pt frequently getting up from bed w/o notifying nursing staff and has disconnected IV and NPWT device w/o nursing staff present. Pt educated on hazards of removing devices and lines and educated on fall prevention strategies. Pt verbalized understanding of plan of care. Will continue to monitor.

## 2021-08-27 NOTE — Progress Notes (Signed)
°  Subjective: 1 Day Post-Op Procedure(s) (LRB): TOTAL HIP ARTHROPLASTY ANTERIOR APPROACH (Right) Patient reports pain as well-controlled.   Patient is well but expresses concern with where he will go after discharge. States that he is not able to return to his girlfriend's house at this time as it is undergoing remodeling and does not feel his own home is safe for him to return to. Will not have a place to return to until Thursday per patient. Asks about rehab placement. Also repeatedly asks for something for anxiety. Negative for chest pain and shortness of breath Fever: no Gastrointestinal: negative for nausea and vomiting at this time.   Objective: Vital signs in last 24 hours: Temp:  [97.2 F (36.2 C)-98.3 F (36.8 C)] 98.3 F (36.8 C) (02/11 0809) Pulse Rate:  [69-117] 80 (02/11 0809) Resp:  [10-22] 18 (02/11 0809) BP: (102-156)/(70-108) 146/85 (02/11 0809) SpO2:  [91 %-100 %] 96 % (02/11 0809)  Intake/Output from previous day:  Intake/Output Summary (Last 24 hours) at 08/27/2021 1137 Last data filed at 08/27/2021 1035 Gross per 24 hour  Intake 1140 ml  Output 3745 ml  Net -2605 ml    Intake/Output this shift: Total I/O In: 240 [P.O.:240] Out: 400 [Urine:400]  Labs: Recent Labs    08/25/21 0942 08/26/21 1632 08/27/21 0521  HGB 15.1 13.4 12.8*   Recent Labs    08/26/21 1632 08/27/21 0521  WBC 18.4* 15.0*  RBC 4.32 4.17*  HCT 39.5 38.2*  PLT 245 260   Recent Labs    08/25/21 0942 08/26/21 1632 08/27/21 0521  NA 136  --  135  K 4.0  --  4.5  CL 102  --  102  CO2 26  --  22  BUN 14  --  19  CREATININE 0.57* 0.83 0.90  GLUCOSE 98  --  151*  CALCIUM 9.4  --  9.0   No results for input(s): LABPT, INR in the last 72 hours.   EXAM General - Patient is Alert, Appropriate, and Oriented Extremity - Neurovascular intact Dorsiflexion/Plantar flexion intact Compartment soft Decreased sensation to light touch over the distal anterior and lateral  thigh Dressing/Incision -Prevena in place, no drainage in cannister  Motor Function - intact, moving foot and toes well on exam.     Assessment/Plan: 1 Day Post-Op Procedure(s) (LRB): TOTAL HIP ARTHROPLASTY ANTERIOR APPROACH (Right) Principal Problem:   Status post total hip replacement, right  Estimated body mass index is 36.96 kg/m as calculated from the following:   Height as of this encounter: 5\' 6"  (1.676 m).   Weight as of this encounter: 103.9 kg. Advance diet Up with therapy  Patient seen with Dr Posey Pronto. Plan to d/c PCA tomorrow.  Patient is safe for d/c from a therapy standpoint but unsure of  discharge plans at this time. Patient instructed to try to find friend/family to be able to stay with. Will contact care management to ask about options, but seems unlikely given patient's progress with PT.    Increased Xanax dose from 2.5--> .5 mg q8h PRN.   DVT Prophylaxis - Lovenox, Ted hose, and SCDs Weight-Bearing as tolerated to right leg  Cassell Smiles, PA-C [facility] Orthopaedic Surgery 08/27/2021, 11:37 AM

## 2021-08-28 LAB — CBC
HCT: 33.4 % — ABNORMAL LOW (ref 39.0–52.0)
Hemoglobin: 11.2 g/dL — ABNORMAL LOW (ref 13.0–17.0)
MCH: 30.1 pg (ref 26.0–34.0)
MCHC: 33.5 g/dL (ref 30.0–36.0)
MCV: 89.8 fL (ref 80.0–100.0)
Platelets: 192 10*3/uL (ref 150–400)
RBC: 3.72 MIL/uL — ABNORMAL LOW (ref 4.22–5.81)
RDW: 13.8 % (ref 11.5–15.5)
WBC: 11.9 10*3/uL — ABNORMAL HIGH (ref 4.0–10.5)
nRBC: 0 % (ref 0.0–0.2)

## 2021-08-28 LAB — GLUCOSE, CAPILLARY
Glucose-Capillary: 110 mg/dL — ABNORMAL HIGH (ref 70–99)
Glucose-Capillary: 99 mg/dL (ref 70–99)

## 2021-08-28 MED ORDER — ENOXAPARIN SODIUM 40 MG/0.4ML IJ SOSY: 40.0000 mg | PREFILLED_SYRINGE | INTRAMUSCULAR | 0 refills | Status: AC

## 2021-08-28 MED ORDER — DOCUSATE SODIUM 100 MG PO CAPS: 100.0000 mg | ORAL_CAPSULE | Freq: Two times a day (BID) | ORAL | 0 refills | Status: AC

## 2021-08-28 MED ORDER — SODIUM CHLORIDE 0.9 % IV BOLUS
500.0000 mL | Freq: Once | INTRAVENOUS | Status: AC
  Administered 2021-08-28: 500 mL via INTRAVENOUS

## 2021-08-28 NOTE — Discharge Summary (Addendum)
Physician Discharge Summary  Patient ID: Jeremy Long MRN: [id number] DOB/AGE: 58-Feb-1967 58 y.o.  Admit date: 08/26/2021 Discharge date: 08/28/2021  Admission Diagnoses:  Status post total hip replacement, right [Z96.641]  Surgeries:Procedure(s): TOTAL HIP ARTHROPLASTY ANTERIOR APPROACH (Right)   SURGEON: Laurene Footman, MD   ASSISTANTS: none   ANESTHESIA:   general   EBL:  Total I/O In: 900 [I.V.:800; IV Piggyback:100] Out: 20 [Blood:20]   BLOOD ADMINISTERED:none   DRAINS:  Incisional wound     LOCAL MEDICATIONS USED:  MARCAINE    and OTHER Exparel   SPECIMEN: Right femoral head   DISPOSITION OF SPECIMEN:  PATHOLOGY   COUNTS:  YES   TOURNIQUET:  * No tourniquets in log *   IMPLANTS: Medacta Masterloc lateralized 6 stem with 58 mm Mpact DM cup and liner with M ceramic 28 mm head  Discharge Diagnoses: Patient Active Problem List   Diagnosis Date Noted   Status post total hip replacement, right 08/26/2021   Knee pain, right 06/21/2021   Bilateral hip pain 10/27/2019   Pulmonary emphysema ([place]) 08/06/2019   Post laminectomy syndrome 07/09/2019   Chronic pain syndrome 06/09/2019   Chronic, continuous use of opioids 06/09/2019   DDD (degenerative disc disease), lumbar 05/19/2019   Facet arthropathy, lumbar 05/19/2019   Drug abuse ([place]) 05/19/2019   Sciatica 05/19/2019   Hepatitis C antibody test positive 05/16/2018   IV drug abuse ([place]) 05/16/2018   Chest pain with moderate risk for cardiac etiology 01/23/2018   Smoker 01/23/2018   Essential hypertension 01/23/2018   Hyperlipidemia 01/23/2018   Back abscess 04/26/2017   Aspiration pneumonia due to vomit ([place]) 10/31/2015   Acute hypoxemic respiratory failure ([place]) 10/30/2015   Somnolence 10/30/2015   Low back pain 07/28/2011    Past Medical History:  Diagnosis Date   Acute hypoxemic respiratory failure ([place]) 10/30/2015   Aspiration pneumonia due to vomit ([place]) 10/31/2015   Back abscess 04/26/2017    Bilateral hip pain 10/27/2019   Chronic back pain    Chronic pain syndrome 06/09/2019   COPD (chronic obstructive pulmonary disease) ([place])    DDD (degenerative disc disease), lumbar 05/19/2019   Diabetes mellitus without complication ([place])    type 2   Drug abuse ([place]) 05/19/2019   Essential hypertension 01/23/2018   Facet arthritis of lumbar region 05/19/2019   Gout    Knee pain, right 06/21/2021   Low back pain 07/28/2011   MRSA infection    neck   Post laminectomy syndrome 07/09/2019   Sciatica 05/19/2019   Somnolence 10/30/2015   Tobacco use      Transfusion:    Consultants (if any):   Discharged Condition: Improved  Hospital Course: Jeremy Long is an 56 y.o. male who was admitted 08/26/2021 with a diagnosis of right hip osteoarthritis and went to the operating room on 08/26/2021 and underwent right total hip arthroplasty through anterior approach. The patient received perioperative antibiotics for prophylaxis (see below). The patient tolerated the procedure well and was transported to PACU in stable condition. After meeting PACU criteria, the patient was subsequently transferred to the Orthopaedics/Rehabilitation unit.   The patient received DVT prophylaxis in the form of early mobilization, Lovenox, TED hose, and SCDs . A sacral pad had been placed and heels were elevated off of the bed with rolled towels in order to protect skin integrity. Foley catheter was discontinued on postoperative day #0. The surgical incision was healing well without signs of infection.  Physical therapy was initiated postoperatively for transfers, gait  training, and strengthening. Occupational therapy was initiated for activities of daily living and evaluation for assisted devices. Rehabilitation goals were reviewed in detail with the patient. The patient made steady progress with physical therapy and physical therapy recommended discharge to Home.   The patient achieved the preliminary goals of  this hospitalization and was felt to be medically and orthopaedically appropriate for discharge.  He was given perioperative antibiotics:  Anti-infectives (From admission, onward)    Start     Dose/Rate Route Frequency Ordered Stop   08/26/21 1800  ceFAZolin (ANCEF) IVPB 2g/100 mL premix        2 g 200 mL/hr over 30 Minutes Intravenous Every 6 hours 08/26/21 1410 08/27/21 0135   08/26/21 1034  ceFAZolin (ANCEF) 2-4 GM/100ML-% IVPB       Note to Pharmacy: Doreen Salvage J: cabinet override      08/26/21 1034 08/26/21 1222   08/26/21 0600  ceFAZolin (ANCEF) IVPB 2g/100 mL premix        2 g 200 mL/hr over 30 Minutes Intravenous On call to O.R. 08/26/21 0139 08/26/21 1239     .  Recent vital signs:  Vitals:   08/28/21 0752 08/28/21 1034  BP: 112/60 90/69  Pulse: (!) 105 92  Resp: 16 14  Temp: 100.2 F (37.9 C)   SpO2: 95% 93%    Recent laboratory studies:  Recent Labs    08/26/21 1632 08/27/21 0521 08/28/21 0437  WBC 18.4* 15.0* 11.9*  HGB 13.4 12.8* 11.2*  HCT 39.5 38.2* 33.4*  PLT 245 260 192  K  --  4.5  --   CL  --  102  --   CO2  --  22  --   BUN  --  19  --   CREATININE 0.83 0.90  --   GLUCOSE  --  151*  --   CALCIUM  --  9.0  --     Diagnostic Studies: DG C-Arm 1-60 Min-No Report  Result Date: 08/26/2021 Fluoroscopy was utilized by the requesting physician.  No radiographic interpretation.   DG HIP UNILAT WITH PELVIS 1V RIGHT  Result Date: 08/26/2021 CLINICAL DATA:  Right hip replacement EXAM: DG HIP (WITH OR WITHOUT PELVIS) 1V RIGHT COMPARISON:  None. FINDINGS: Single intraoperative view demonstrates right hip arthroplasty without hardware complication or periprosthetic fracture. IMPRESSION: Intraoperative imaging of right hip arthroplasty. Electronically Signed   By: Abigail Miyamoto M.D.   On: 08/26/2021 14:09   DG HIP UNILAT W OR W/O PELVIS 2-3 VIEWS RIGHT  Result Date: 08/26/2021 CLINICAL DATA:  Postop for total hip arthroplasty EXAM: DG HIP (WITH OR  WITHOUT PELVIS) 2-3V RIGHT COMPARISON:  Intraoperative imaging of earlier today. FINDINGS: AP and lateral views demonstrate right hip arthroplasty with overlying surgical staples, drain, and soft tissue gas. No periprosthetic fracture or acute hardware complication. IMPRESSION: Expected appearance after right hip arthroplasty. Electronically Signed   By: Abigail Miyamoto M.D.   On: 08/26/2021 14:22    Discharge Medications:   Allergies as of 08/28/2021       Reactions   Morphine And Related    Can not tolerate the pills but can take intravenous        Medication List     TAKE these medications    Accu-Chek Guide test strip Generic drug: glucose blood 1 EACH (1 STRIP TOTAL) BY XX ROUTE 3 (THREE) TIMES DAILY USE AS INSTRUCTED.   Accu-Chek Guide w/Device Kit See admin instructions.   Accu-Chek Softclix Lancets lancets 1 each  3 (three) times daily.   celecoxib 100 MG capsule Commonly known as: CELEBREX Take 100 mg by mouth daily as needed for moderate pain.   docusate sodium 100 MG capsule Commonly known as: COLACE Take 1 capsule (100 mg total) by mouth 2 (two) times daily.   enoxaparin 40 MG/0.4ML injection Commonly known as: LOVENOX Inject 0.4 mLs (40 mg total) into the skin daily for 14 days.   lisinopril 10 MG tablet Commonly known as: ZESTRIL Take 10 mg by mouth daily.   naloxone 4 MG/0.1ML Liqd nasal spray kit Commonly known as: NARCAN As directed for opioid induced respiratory depression   oxyCODONE 15 MG immediate release tablet Commonly known as: ROXICODONE Take 1 tablet (15 mg total) by mouth every 4 (four) hours as needed for pain.   oxyCODONE 15 MG immediate release tablet Commonly known as: ROXICODONE Take 1 tablet (15 mg total) by mouth every 4 (four) hours as needed for pain. Start taking on: September 24, 2021   Ozempic (0.25 or 0.5 MG/DOSE) 2 MG/1.5ML Sopn Generic drug: Semaglutide(0.25 or 0.5MG/DOS) Inject 0.5 mg into the skin every Saturday.                Durable Medical Equipment  (From admission, onward)           Start     Ordered   08/28/21 1434  For home use only DME 3 n 1  Once        08/28/21 1433   08/28/21 1434  For home use only DME Walker  Once       Question:  Patient needs a walker to treat with the following condition  Answer:  S/P total right hip arthroplasty   08/28/21 1433   08/26/21 1412  DME Walker rolling  Once       Question Answer Comment  Walker: With [place]   Patient needs a walker to treat with the following condition Status post total hip replacement, right      08/26/21 1411   08/26/21 1412  DME 3 n 1  Once        08/26/21 1411   08/26/21 1412  DME Bedside commode  Once       Question:  Patient needs a bedside commode to treat with the following condition  Answer:  Status post total hip replacement, right   08/26/21 1411            Disposition: Home with home health PT     Follow-up Information     Duanne Guess, PA-C. Schedule an appointment as soon as possible for a visit in 2 week(s).   Specialties: Orthopedic Surgery, Emergency Medicine Why: staple removal Contact information: [place] [address] [place], PA-C 08/28/2021, 2:36 PM

## 2021-08-28 NOTE — TOC Progression Note (Signed)
Transition of Care [facility]) - Progression Note    Patient Details  Name: Jeremy Long MRN: [id number] Date of Birth: [birth year]  [facility] CM/SW Contact  Izola Price, RN Phone Number: 08/28/2021, 1:52 PM  Clinical Narrative:  2/11: Patient has discharge orders with [facility] orders. Medicaid insurance. Contacted [employer] re RW and 3:1 home DME to be delivered to [address] [place], [place] ([address]). Requested orders for DME as none present. Patient anxious to leave and may not wait on CM to find agency to accept Medicaid. Outpatient options/securing HH via PCP post discharge relayed to patient. Simmie Davies RN CM   207 PM Unit RN relayed to this CM that patient if choosing not to wait on [facility] securement and will reach out to PCP. Outpatient facility list printed to unit and again reinforced contacting PCP for assistance in obtaining therapy. DME to be delivered to above addres. Simmie Davies RN CM          Expected Discharge Plan and Services                           DME Arranged: 3-N-1, Walker rolling DME Agency: [employer] Date DME Agency Contacted: 08/28/21 Time DME Agency Contacted: [phone] Representative spoke with at DME Agency: [place] Interventions    Readmission Risk Interventions No flowsheet data found.

## 2021-08-28 NOTE — Progress Notes (Signed)
Subjective:  Patient ID: Jeremy Long, male    DOB: [birth year]  Age: 56 y.o. MRN: [id number]  CC: Back Pain   Procedure: None  HPI Jeremy Long presents for reevaluation.  He reports persistent hip pain and low back pain in addition to diffuse body pain.  He reports at this time that the back pain has been severe with radiating pain going into the hip and buttock region.  This occasionally goes down his legs but his most severe pain is his hip pain.  He is scheduled to have a hip replacement here in the next few days with Dr. Rudene Christians.  The quality characteristic and distribution of his low back pain and hip pain are stable in nature.  Unfortunately he has not been able to get any significant improvement or relief with his hip pain and replacement is indicated.  His medications continue to work reasonably well for him regarding his low back pain.  He generally gets 50 to 75% improvement lasting 3 to 4 hours with the medication that he is taking.  No side effects are reported.  He feels that without the medications he would not be able to survive.  He denies any illicit or diverting use.  No facility-administered medications prior to visit.   Outpatient Medications Prior to Visit  Medication Sig Dispense Refill   ACCU-CHEK GUIDE test strip 1 EACH (1 STRIP TOTAL) BY XX ROUTE 3 (THREE) TIMES DAILY USE AS INSTRUCTED.     Accu-Chek Softclix Lancets lancets 1 each 3 (three) times daily.     Blood Glucose Monitoring Suppl (ACCU-CHEK GUIDE) w/Device KIT See admin instructions.     celecoxib (CELEBREX) 100 MG capsule Take 100 mg by mouth daily as needed for moderate pain. (Patient not taking: Reported on 08/26/2021)     lisinopril (ZESTRIL) 10 MG tablet Take 10 mg by mouth daily.     naloxone (NARCAN) nasal spray 4 mg/0.1 mL As directed for opioid induced respiratory depression (Patient not taking: Reported on 08/26/2021) 1 each 2   OZEMPIC, 0.25 OR 0.5 MG/DOSE, 2 MG/1.5ML SOPN Inject 0.5 mg into the  skin every Saturday.     oxyCODONE (ROXICODONE) 15 MG immediate release tablet Take 1 tablet (15 mg total) by mouth every 4 (four) hours as needed for pain. 150 tablet 0    Review of Systems CNS: No confusion or sedation Cardiac: No angina or palpitations GI: No abdominal pain or constipation Constitutional: No nausea vomiting fevers or chills  Objective:  BP 132/85    Pulse 76    Temp 98.1 F (36.7 C)    Resp 16    Ht '5\' 6"'  (1.676 m)    Wt 229 lb (103.9 kg)    SpO2 98%    BMI 36.96 kg/m    BP Readings from Last 3 Encounters:  08/28/21 90/69  08/25/21 132/85  01/29/21 130/81     Wt Readings from Last 3 Encounters:  08/26/21 229 lb (103.9 kg)  08/25/21 229 lb (103.9 kg)  07/20/21 232 lb (105.2 kg)     Physical Exam Pt is alert and oriented PERRL EOMI HEART IS RRR no murmur or rub LCTA no wheezing or rales MUSCULOSKELETAL reveals some paraspinous muscle tenderness in the lumbar region no overt trigger points.  He walks with a severely antalgic gait.  His muscle tone and bulk to the lower extremities is at baseline  Labs  No results found for: HGBA1C Lab Results  Component Value Date   CREATININE 0.90 08/27/2021    --------------------------------------------------------------------------------------------------------------------  Lab Results  Component Value Date   WBC 11.9 (H) 08/28/2021   HGB 11.2 (L) 08/28/2021   HCT 33.4 (L) 08/28/2021   PLT 192 08/28/2021   GLUCOSE 151 (H) 08/27/2021   ALT 16 08/25/2021   AST 20 08/25/2021   NA 135 08/27/2021   K 4.5 08/27/2021   CL 102 08/27/2021   CREATININE 0.90 08/27/2021   BUN 19 08/27/2021   CO2 22 08/27/2021    --------------------------------------------------------------------------------------------------------------------- DG C-Arm 1-60 Min-No Report  Result Date: 08/26/2021 Fluoroscopy was utilized by the requesting physician.  No radiographic interpretation.   DG HIP UNILAT WITH PELVIS 1V  RIGHT  Result Date: 08/26/2021 CLINICAL DATA:  Right hip replacement EXAM: DG HIP (WITH OR WITHOUT PELVIS) 1V RIGHT COMPARISON:  None. FINDINGS: Single intraoperative view demonstrates right hip arthroplasty without hardware complication or periprosthetic fracture. IMPRESSION: Intraoperative imaging of right hip arthroplasty. Electronically Signed   By: Abigail Miyamoto M.D.   On: 08/26/2021 14:09   DG HIP UNILAT W OR W/O PELVIS 2-3 VIEWS RIGHT  Result Date: 08/26/2021 CLINICAL DATA:  Postop for total hip arthroplasty EXAM: DG HIP (WITH OR WITHOUT PELVIS) 2-3V RIGHT COMPARISON:  Intraoperative imaging of earlier today. FINDINGS: AP and lateral views demonstrate right hip arthroplasty with overlying surgical staples, drain, and soft tissue gas. No periprosthetic fracture or acute hardware complication. IMPRESSION: Expected appearance after right hip arthroplasty. Electronically Signed   By: Abigail Miyamoto M.D.   On: 08/26/2021 14:22     Assessment & Plan:   Jeremy Long was seen today for back pain.  Diagnoses and all orders for this visit:  Chronic pain syndrome -     ToxASSURE Select 13 (MW), Urine  Chronic, continuous use of opioids -     ToxASSURE Select 13 (MW), Urine  DDD (degenerative disc disease), lumbar  Drug abuse (HCC)  Facet arthritis of lumbar region  Post laminectomy syndrome  Bilateral sciatica  Bilateral hip pain  Facet arthropathy, lumbar  Other orders -     oxyCODONE (ROXICODONE) 15 MG immediate release tablet; Take 1 tablet (15 mg total) by mouth every 4 (four) hours as needed for pain. -     oxyCODONE (ROXICODONE) 15 MG immediate release tablet; Take 1 tablet (15 mg total) by mouth every 4 (four) hours as needed for pain.        ----------------------------------------------------------------------------------------------------------------------  Problem List Items Addressed This Visit       Unprioritized   Bilateral hip pain   Chronic pain syndrome - Primary    Relevant Medications   oxyCODONE (ROXICODONE) 15 MG immediate release tablet   oxyCODONE (ROXICODONE) 15 MG immediate release tablet (Start on 09/24/2021)   Other Relevant Orders   ToxASSURE Select 13 (MW), Urine   Chronic, continuous use of opioids   Relevant Orders   ToxASSURE Select 13 (MW), Urine   DDD (degenerative disc disease), lumbar   Relevant Medications   oxyCODONE (ROXICODONE) 15 MG immediate release tablet   oxyCODONE (ROXICODONE) 15 MG immediate release tablet (Start on 09/24/2021)   Drug abuse ([place])   Facet arthropathy, lumbar   Post laminectomy syndrome   Sciatica   Other Visit Diagnoses     Facet arthritis of lumbar region       Relevant Medications   oxyCODONE (ROXICODONE) 15 MG immediate release tablet   oxyCODONE (ROXICODONE) 15 MG immediate release tablet (Start on 09/24/2021)         ----------------------------------------------------------------------------------------------------------------------  1. Chronic pain syndrome I have reviewed the [facility] practitioner  database information I think it is appropriate for refill.  He is requesting an increase in medication secondary to the recent increased hip pain.  I have instructed him to continue with his current regimen and continue with his anti-inflammatory medicines as discussed.  No other changes will be initiated in his regimen.  I want him to continue with core stretching strengthening exercises as tolerated. - ToxASSURE Select 13 (MW), Urine  2. Chronic, continuous use of opioids As above - ToxASSURE Select 13 (MW), Urine  3. DDD (degenerative disc disease), lumbar As above with continued core stretching  4. Drug abuse ([place]) He appears stable with no evidence of diverting or illicit use  5. Facet arthritis of lumbar region As above  6. Post laminectomy syndrome   7. Bilateral sciatica   8. Bilateral hip pain Follow-up with Dr. Rudene Christians for total hip arthroplasty as scheduled  9.  Facet arthropathy, lumbar     ----------------------------------------------------------------------------------------------------------------------  I am having Jeremy Long start on oxyCODONE. I am also having him maintain his lisinopril, Accu-Chek Guide, Accu-Chek Softclix Lancets, Accu-Chek Guide, naloxone, Ozempic (0.25 or 0.5 MG/DOSE), celecoxib, and oxyCODONE.   Meds ordered this encounter  Medications   oxyCODONE (ROXICODONE) 15 MG immediate release tablet    Sig: Take 1 tablet (15 mg total) by mouth every 4 (four) hours as needed for pain.    Dispense:  150 tablet    Refill:  0   oxyCODONE (ROXICODONE) 15 MG immediate release tablet    Sig: Take 1 tablet (15 mg total) by mouth every 4 (four) hours as needed for pain.    Dispense:  150 tablet    Refill:  0   Patient's Medications  New Prescriptions   DOCUSATE SODIUM (COLACE) 100 MG CAPSULE    Take 1 capsule (100 mg total) by mouth 2 (two) times daily.   ENOXAPARIN (LOVENOX) 40 MG/0.4ML INJECTION    Inject 0.4 mLs (40 mg total) into the skin daily for 14 days.   OXYCODONE (ROXICODONE) 15 MG IMMEDIATE RELEASE TABLET    Take 1 tablet (15 mg total) by mouth every 4 (four) hours as needed for pain.  Previous Medications   ACCU-CHEK GUIDE TEST STRIP    1 EACH (1 STRIP TOTAL) BY XX ROUTE 3 (THREE) TIMES DAILY USE AS INSTRUCTED.   ACCU-CHEK SOFTCLIX LANCETS LANCETS    1 each 3 (three) times daily.   BLOOD GLUCOSE MONITORING SUPPL (ACCU-CHEK GUIDE) W/DEVICE KIT    See admin instructions.   CELECOXIB (CELEBREX) 100 MG CAPSULE    Take 100 mg by mouth daily as needed for moderate pain.   LISINOPRIL (ZESTRIL) 10 MG TABLET    Take 10 mg by mouth daily.   NALOXONE (NARCAN) NASAL SPRAY 4 MG/0.1 ML    As directed for opioid induced respiratory depression   OZEMPIC, 0.25 OR 0.5 MG/DOSE, 2 MG/1.5ML SOPN    Inject 0.5 mg into the skin every Saturday.  Modified Medications   Modified Medication Previous Medication   OXYCODONE (ROXICODONE)  15 MG IMMEDIATE RELEASE TABLET oxyCODONE (ROXICODONE) 15 MG immediate release tablet      Take 1 tablet (15 mg total) by mouth every 4 (four) hours as needed for pain.    Take 1 tablet (15 mg total) by mouth every 4 (four) hours as needed for pain.  Discontinued Medications   No medications on file   ----------------------------------------------------------------------------------------------------------------------  Follow-up: Return in about 2 months (around 10/23/2021) for evaluation, med refill.  Return to clinic in 2  months  Molli Barrows, MD

## 2021-08-28 NOTE — Progress Notes (Addendum)
Physical Therapy Treatment Patient Details Name: Jeremy Long MRN: [id number] DOB: [birth year] Today's Date: 08/28/2021   History of Present Illness Pt is a 56 yo M diagnosed with primary osteoarthritis of right hip and is s/p elective R THA. PMH includes COPD, HTN, DM, resp failure, chronic back pain, and lumbar fusion.    PT Comments    Pt up in room upon arrival holding onto tray table bending over and looking in back pack  Pt talking a lot about medication while in back pack but unable to fully understand pt given mumbling and tv volume which I could not turn down.  He is able to walk to PT gym and back and complete stairs.  Cues to remain on task as pt would frequently talk then stop talking as if falling asleep even on his feet.  Pt stated he felt well but was "distracted".  Upon return to room pt is cued and agrees to lay back down on bed as he fell asleep x 4 while sitting EOB again looking in back pack.  Pt left in supine with bed alarm set for safety.  Discussed with RN.  It is questionable if pt is self medicating in room.  Pt denied stated he would not do that but is well versed in pain medication stating he hoped he could get liquid dilauded for discharge but "not benzo's, I don't take them anymore."  Stated he has oxy for his back but will need something else for his hip.  Told pt to discuss with MD.  Discussed with RN.  Pt did state he was hopeful to be discharge home today.  While pt remains with decreased safety and overall judgement impairments, he self directs sessions and is able to complete gait and stairs with no assist despite overall safety concerns.   Recommendations for follow up therapy are one component of a multi-disciplinary discharge planning process, led by the attending physician.  Recommendations may be updated based on patient status, additional functional criteria and insurance authorization.  Follow Up Recommendations  Home health PT     Assistance  Recommended at Discharge PRN  Patient can return home with the following A little help with walking and/or transfers;A little help with bathing/dressing/bathroom;Help with stairs or ramp for entrance;Assist for transportation   Equipment Recommendations  BSC/3in1;Rolling walker (2 wheels)    Recommendations for Other Services       Precautions / Restrictions Precautions Precautions: Fall;Anterior Hip Precaution Booklet Issued: Yes (comment) Precaution Comments: Extremely impulsive Restrictions Weight Bearing Restrictions: Yes RLE Weight Bearing: Weight bearing as tolerated     Mobility  Bed Mobility Overal bed mobility: Needs Assistance Bed Mobility: Sit to Supine       Sit to supine: Min assist   General bed mobility comments: for LE's    Transfers Overall transfer level: Needs assistance Equipment used: Rolling walker (2 wheels) Transfers: Sit to/from Stand Sit to Stand: Supervision                Ambulation/Gait Ambulation/Gait assistance: Supervision Gait Distance (Feet): 200 Feet Assistive device: Rolling walker (2 wheels) Gait Pattern/deviations: Step-through pattern Gait velocity: WNL     General Gait Details: poor safety and posture, often leaning on walker handles   Stairs   Stairs assistance: Supervision   Number of Stairs: 4 General stair comments: no lob's but cues to stay on task   Wheelchair Mobility    Modified Rankin (Stroke Patients Only)       Balance Overall balance  assessment: Modified Independent   Sitting balance-Leahy Scale: Good     Standing balance support: Bilateral upper extremity supported, During functional activity Standing balance-Leahy Scale: Fair                              Cognition Arousal/Alertness: Awake/alert, Suspect due to medications Behavior During Therapy: WFL for tasks assessed/performed Overall Cognitive Status: No family/caregiver present to determine baseline cognitive  functioning                                          Exercises      General Comments        Pertinent Vitals/Pain Pain Assessment Pain Assessment: Faces Faces Pain Scale: Hurts little more Pain Location: gluts and quads Pain Descriptors / Indicators: Aching, Sore, Grimacing, Moaning Pain Intervention(s): Limited activity within patient's tolerance, Monitored during session, Premedicated before session, Repositioned    Home Living                          Prior Function            PT Goals (current goals can now be found in the care plan section) Progress towards PT goals: Progressing toward goals    Frequency    BID      PT Plan Current plan remains appropriate    Co-evaluation              AM-PAC PT "6 Clicks" Mobility   Outcome Measure  Help needed turning from your back to your side while in a flat bed without using bedrails?: None Help needed moving from lying on your back to sitting on the side of a flat bed without using bedrails?: A Little Help needed moving to and from a bed to a chair (including a wheelchair)?: A Little Help needed standing up from a chair using your arms (e.g., wheelchair or bedside chair)?: A Little Help needed to walk in hospital room?: A Little Help needed climbing 3-5 steps with a railing? : A Little 6 Click Score: 19    End of Session Equipment Utilized During Treatment: Gait belt Activity Tolerance: Patient limited by pain Patient left: in bed;with bed alarm set;with call bell/phone within reach;with family/visitor present Nurse Communication: Mobility status;Weight bearing status PT Visit Diagnosis: Other abnormalities of gait and mobility (R26.89);Muscle weakness (generalized) (M62.81);Pain Pain - Right/Left: Right Pain - part of body: Knee     Time: 6808-8110 PT Time Calculation (min) (ACUTE ONLY): 27 min  Charges:  $Gait Training: 23-37 mins                    Chesley Noon,  PTA 08/28/21, 9:11 AM

## 2021-08-28 NOTE — TOC Transition Note (Addendum)
Transition of Care [facility]) - CM/SW Discharge Note   Patient Details  Name: Jeremy Long MRN: [id number] Date of Birth: March 16, 1966  [facility] CM/SW Contact:  Izola Price, RN Phone Number: 08/28/2021, 2:11 PM   Clinical Narrative:  2/12: Discharge today. Patient choosing to decline/not wait for [facility] agency securement before leaving hospital or to wait on DME delivery to hospital.  Patient was informed to contact PCP post discharge for assistance setting up therapies. DME to be delivered to  [address], [place], [place] ([address]) as patient did not want to wait. [employer] will attempt to deliver today but it may be tomorrow. Contact info given to [facility] [place] with delivery address which is different from home address. Unit RN to update provider. Patient was threatening to sign out AMA per Unit RN.  Simmie Davies RN CM [phone].     Final next level of care: [place] (Patient declined to wait on [place] before discharging.) Barriers to Discharge: Barriers Resolved   Patient Goals and CMS Choice        Discharge Placement                       Discharge Plan and Services                DME Arranged: 3-N-1, Walker rolling DME Agency: [employer] Date DME Agency Contacted: 08/28/21 Time DME Agency Contacted: [id number] Representative spoke with at DME Agency: [place]:  (Patient to secure therapy via PCP as outpatient or [place].) [place] Agency: Other - See comment        Social Determinants of Health (SDOH) Interventions     Readmission Risk Interventions No flowsheet data found.

## 2021-08-28 NOTE — Progress Notes (Addendum)
Subjective: 2 Days Post-Op Procedure(s) (LRB): TOTAL HIP ARTHROPLASTY ANTERIOR APPROACH (Right) Patient reports pain as well-controlled.   Patient is well, and has had no acute complaints or problems. States that Jeremy Long his gf will allow him to stay with her for a few days before he returns to his home.  Plan is to go Home after hospital stay. Negative for chest pain and shortness of breath Fever: no Gastrointestinal: negative for nausea and vomiting.  Patient reports he has had a BM  Objective: Vital signs in last 24 hours: Temp:  [97.9 F (36.6 C)-100.2 F (37.9 C)] 100.2 F (37.9 C) (02/12 0752) Pulse Rate:  [76-105] 105 (02/12 0752) Resp:  [16-22] 16 (02/12 0752) BP: (96-120)/(48-79) 112/60 (02/12 0752) SpO2:  [93 %-99 %] 95 % (02/12 0752)  Intake/Output from previous day:  Intake/Output Summary (Last 24 hours) at 08/28/2021 1049 Last data filed at 08/28/2021 1032 Gross per 24 hour  Intake 1200 ml  Output 1050 ml  Net 150 ml    Intake/Output this shift: Total I/O In: 120 [P.O.:120] Out: -   Labs: Recent Labs    08/26/21 1632 08/27/21 0521 08/28/21 0437  HGB 13.4 12.8* 11.2*   Recent Labs    08/27/21 0521 08/28/21 0437  WBC 15.0* 11.9*  RBC 4.17* 3.72*  HCT 38.2* 33.4*  PLT 260 192   Recent Labs    08/26/21 1632 08/27/21 0521  NA  --  135  K  --  4.5  CL  --  102  CO2  --  22  BUN  --  19  CREATININE 0.83 0.90  GLUCOSE  --  151*  CALCIUM  --  9.0   No results for input(s): LABPT, INR in the last 72 hours.   EXAM General - Patient is  very drowsy, he is A&Ox4 when roused but will fall asleep nearly immediately when he lays back Extremity - Neurovascular intact Dorsiflexion/Plantar flexion intact Compartment soft Dressing/Incision -Prevena is in place but unit is continuously running to maintain the seal Motor Function - intact, moving foot and toes well on exam.     Assessment/Plan: 2 Days Post-Op Procedure(s) (LRB): TOTAL HIP  ARTHROPLASTY ANTERIOR APPROACH (Right) Principal Problem:   Status post total hip replacement, right  Estimated body mass index is 36.96 kg/m as calculated from the following:   Height as of this encounter: 5\' 6"  (1.676 m).   Weight as of this encounter: 103.9 kg. Advance diet Up with therapy   Discussed that patient should not be discharged when he is not able to remain awake during conversation. Review of PT note shows that was also the case during PT this AM. Repeat set of vitals overall reassuring but showed patient is somewhat hypotensive. Patient's drowsiness does not correlate with the amount of medication that he has been given, suspect strongly that patient is self-medicating. I will hold off on discharge until patient returns to his baseline. Explained that it was not safe to discharge him at this time given his somnolence. If he returns to baseline later today would be amenable to d/c.   Hypotension -5101mL bolus of NaCl  Prevena dressing was reinforced.   Addendum, 12:48 PM: changed prevena dressing due to persistent leaking around seal despite reinforcement. Patient no longer somnolent, able to be d/c'd this PM if able to get ride home. Advised he should contact the office if he requires more pain medication as none is prescribed by me at discharge today.   DVT Prophylaxis - Lovenox  and SCDs Weight-Bearing as tolerated to right leg  Cassell Smiles, PA-C [facility] 08/28/2021, 10:49 AM

## 2021-08-28 NOTE — Progress Notes (Signed)
Pt's VSS at the time of discharge. All belongings from security returned to pt. Home medications from pharmacy returned to patient. Portable prevena wound vac intact and pt instructed to change dressing in 7 days, new dressing provided. Pt denies waiting for HHPT to be arranged and will schedule out patient physical therapy, list of PT resources provided to patient. DME to be delivered to address provided by pt.

## 2021-08-29 ENCOUNTER — Encounter: Payer: Self-pay | Admitting: Orthopedic Surgery

## 2021-08-29 NOTE — Progress Notes (Signed)
Pt stated to CNA that he had his own medication of Oxy IR 15 mg in his backpack in his room.  Hotel manager of room 156 explained to patient that the medications will need to be counted and sealed and sent to pharmacy until his discharge.  Patient also states that he had taken a dose of his Oxy but could not give a specific time.  Meds were counted, sealed and walked to Pharmacy by RN.  Attending MD notified.

## 2021-08-30 LAB — SURGICAL PATHOLOGY

## 2021-09-01 LAB — TOXASSURE SELECT 13 (MW), URINE

## 2021-10-10 ENCOUNTER — Other Ambulatory Visit: Payer: Self-pay

## 2021-10-10 ENCOUNTER — Ambulatory Visit: Payer: Medicaid Other | Attending: Anesthesiology | Admitting: Anesthesiology

## 2021-10-10 DIAGNOSIS — F1190 Opioid use, unspecified, uncomplicated: Secondary | ICD-10-CM | POA: Diagnosis not present

## 2021-10-10 DIAGNOSIS — G894 Chronic pain syndrome: Secondary | ICD-10-CM

## 2021-10-10 DIAGNOSIS — M47816 Spondylosis without myelopathy or radiculopathy, lumbar region: Secondary | ICD-10-CM

## 2021-10-10 DIAGNOSIS — M5136 Other intervertebral disc degeneration, lumbar region: Secondary | ICD-10-CM

## 2021-10-10 DIAGNOSIS — F1910 Other psychoactive substance abuse, uncomplicated: Secondary | ICD-10-CM

## 2021-10-10 DIAGNOSIS — M5432 Sciatica, left side: Secondary | ICD-10-CM

## 2021-10-10 DIAGNOSIS — M25552 Pain in left hip: Secondary | ICD-10-CM

## 2021-10-10 DIAGNOSIS — M5431 Sciatica, right side: Secondary | ICD-10-CM

## 2021-10-10 DIAGNOSIS — M51369 Other intervertebral disc degeneration, lumbar region without mention of lumbar back pain or lower extremity pain: Secondary | ICD-10-CM

## 2021-10-10 DIAGNOSIS — M961 Postlaminectomy syndrome, not elsewhere classified: Secondary | ICD-10-CM

## 2021-10-10 DIAGNOSIS — M25551 Pain in right hip: Secondary | ICD-10-CM

## 2021-10-10 MED ORDER — OXYCODONE HCL 15 MG PO TABS: 15.0000 mg | ORAL_TABLET | ORAL | 0 refills | Status: DC | PRN

## 2021-10-10 MED ORDER — OXYCODONE HCL 15 MG PO TABS: 15.0000 mg | ORAL_TABLET | ORAL | 0 refills | Status: AC | PRN

## 2021-10-10 MED ORDER — CELECOXIB 200 MG PO CAPS: 200.0000 mg | ORAL_CAPSULE | Freq: Every day | ORAL | 3 refills | Status: AC

## 2021-10-10 NOTE — Progress Notes (Signed)
Virtual Visit via Telephone Note ? ?I connected with Jeremy Long on 10/10/21 at 11:00 AM EDT by telephone and verified that I am speaking with the correct person using two identifiers. ? ?Location: ?Patient: Home ?Provider: [facility], ?  ?I discussed the limitations, risks, security and privacy concerns of performing an evaluation and management service by telephone and the availability of in person appointments. I also discussed with the patient that there may be a patient responsible charge related to this service. The patient expressed understanding and agreed to proceed. ? ? ?History of Present Illness: ?I spoke with Jeremy Long via telephone as he was unable to do the video portion of the conference he reports that his hip pain is much better following his surgery with Dr. Rudene Long.  He still has a severe low back pain comparable to what he has historically reported.  The quality characteristic and distribution of it are unchanged.  Because of the recent hip surgery he will be out of medications 3 days early he reports.  He has not had any Long effects reported with his medications and continues to get good relief described as approximately 50 to 75% improvement.  This last about 4 hours before he has recurrence of the same disabling low back pain.  Is a gnawing aching disabling back pain that radiates into the buttocks and down the posterior legs.  He is trying to stay active and rehabilitate his hip but the pain has been persistent in the back.  He reports 85% improvement in the hip.  No change in lower extremity strength function or bowel or bladder function is noted.  He has been compliant with his regimen otherwise with no evidence of any diverting or illicit use mentioned.  No Long effects with the medication are reported. ? ?Review of systems: ?General: No fevers or chills ?Pulmonary: No shortness of breath or dyspnea ?Cardiac: No angina or palpitations or lightheadedness ?GI: No abdominal pain or  constipation ?Psych: No depression  ?  ?Observations/Objective: ? ?Current Outpatient Medications:  ?  celecoxib (CELEBREX) 200 MG capsule, Take 1 capsule (200 mg total) by mouth daily., Disp: 30 capsule, Rfl: 3 ?  ACCU-CHEK GUIDE test strip, 1 EACH (1 STRIP TOTAL) BY XX ROUTE 3 (THREE) TIMES DAILY USE AS INSTRUCTED., Disp: , Rfl:  ?  Accu-Chek Softclix Lancets lancets, 1 each 3 (three) times daily., Disp: , Rfl:  ?  Blood Glucose Monitoring Suppl (ACCU-CHEK GUIDE) w/Device KIT, See admin instructions., Disp: , Rfl:  ?  celecoxib (CELEBREX) 100 MG capsule, Take 100 mg by mouth daily as needed for moderate pain. (Patient not taking: Reported on 08/26/2021), Disp: , Rfl:  ?  docusate sodium (COLACE) 100 MG capsule, Take 1 capsule (100 mg total) by mouth 2 (two) times daily., Disp: 10 capsule, Rfl: 0 ?  enoxaparin (LOVENOX) 40 MG/0.4ML injection, Inject 0.4 mLs (40 mg total) into the skin daily for 14 days., Disp: 5.6 mL, Rfl: 0 ?  lisinopril (ZESTRIL) 10 MG tablet, Take 10 mg by mouth daily., Disp: , Rfl:  ?  naloxone (NARCAN) nasal spray 4 mg/0.1 mL, As directed for opioid induced respiratory depression (Patient not taking: Reported on 08/26/2021), Disp: 1 each, Rfl: 2 ?  [START ON 10/21/2021] oxyCODONE (ROXICODONE) 15 MG immediate release tablet, Take 1 tablet (15 mg total) by mouth every 4 (four) hours as needed for pain., Disp: 150 tablet, Rfl: 0 ?  [START ON 11/21/2021] oxyCODONE (ROXICODONE) 15 MG immediate release tablet, Take 1 tablet (15 mg  total) by mouth every 4 (four) hours as needed for pain., Disp: 150 tablet, Rfl: 0 ?  OZEMPIC, 0.25 OR 0.5 MG/DOSE, 2 MG/1.5ML SOPN, Inject 0.5 mg into the skin every Saturday., Disp: , Rfl:   ? ?Past Medical History:  ?Diagnosis Date  ? Acute hypoxemic respiratory failure ([place]) 10/30/2015  ? Aspiration pneumonia due to vomit ([place]) 10/31/2015  ? Back abscess 04/26/2017  ? Bilateral hip pain 10/27/2019  ? Chronic back pain   ? Chronic pain syndrome 06/09/2019  ? COPD (chronic  obstructive pulmonary disease) ([place])   ? DDD (degenerative disc disease), lumbar 05/19/2019  ? Diabetes mellitus without complication ([place])   ? type 2  ? Drug abuse ([place]) 05/19/2019  ? Essential hypertension 01/23/2018  ? Facet arthritis of lumbar region 05/19/2019  ? Gout   ? Knee pain, right 06/21/2021  ? Low back pain 07/28/2011  ? MRSA infection   ? neck  ? Post laminectomy syndrome 07/09/2019  ? Sciatica 05/19/2019  ? Somnolence 10/30/2015  ? Tobacco use   ?  ? ?Assessment and Plan: ?1. Chronic pain syndrome   ?2. Chronic, continuous use of opioids   ?3. DDD (degenerative disc disease), lumbar   ?4. Drug abuse ([place])   ?5. Facet arthritis of lumbar region   ?6. Post laminectomy syndrome   ?7. Bilateral sciatica   ?8. Bilateral hip pain   ?9. Facet arthropathy, lumbar   ?Based on our discussion today I think it is appropriate to continue his current opioid therapy.  We will keep him at the 150 tablets/month and I have added in Celebrex 200 mg once a day.  I think this combination would be beneficial and he denies any Long effects with opiate opioid or anti-inflammatory medications.  No allergies are mentioned.  I want him to continue core stretching strengthening exercises and continue follow-up with his primary care physicians for baseline medical care with a 77-monthreturn to clinic scheduled prescriptions will be made available for April 8 and May 8. ? ?Follow Up Instructions: ? ?  ?I discussed the assessment and treatment plan with the patient. The patient was provided an opportunity to ask questions and all were answered. The patient agreed with the plan and demonstrated an understanding of the instructions. ?  ?The patient was advised to call back or seek an in-person evaluation if the symptoms worsen or if the condition fails to improve as anticipated. ? ?I provided 30 minutes of non-face-to-face time during this encounter. ? ? ?Jeremy Barrows MD  ?

## 2021-12-15 DEATH — deceased

## 2021-12-16 ENCOUNTER — Ambulatory Visit: Payer: Medicaid Other | Attending: Anesthesiology | Admitting: Anesthesiology

## 2021-12-19 MED ORDER — OXYCODONE HCL 15 MG PO TABS: 15.0000 mg | ORAL_TABLET | ORAL | 0 refills | Status: DC | PRN

## 2021-12-19 MED ORDER — OXYCODONE HCL 15 MG PO TABS: 15.0000 mg | ORAL_TABLET | ORAL | 0 refills | Status: AC | PRN
